# Patient Record
Sex: Female | Born: 1950
Health system: Southern US, Community
[De-identification: ages and names within clinical notes are randomized; demographics above are authoritative.]

## PROBLEM LIST (undated history)

## (undated) DIAGNOSIS — F329 Major depressive disorder, single episode, unspecified: Secondary | ICD-10-CM

## (undated) DIAGNOSIS — K219 Gastro-esophageal reflux disease without esophagitis: Secondary | ICD-10-CM

## (undated) DIAGNOSIS — K5909 Other constipation: Secondary | ICD-10-CM

## (undated) DIAGNOSIS — I10 Essential (primary) hypertension: Secondary | ICD-10-CM

## (undated) DIAGNOSIS — F419 Anxiety disorder, unspecified: Secondary | ICD-10-CM

## (undated) DIAGNOSIS — J019 Acute sinusitis, unspecified: Secondary | ICD-10-CM

## (undated) DIAGNOSIS — J309 Allergic rhinitis, unspecified: Secondary | ICD-10-CM

## (undated) DIAGNOSIS — K649 Unspecified hemorrhoids: Secondary | ICD-10-CM

## (undated) DIAGNOSIS — G44209 Tension-type headache, unspecified, not intractable: Secondary | ICD-10-CM

## (undated) DIAGNOSIS — J449 Chronic obstructive pulmonary disease, unspecified: Secondary | ICD-10-CM

## (undated) DIAGNOSIS — F17200 Nicotine dependence, unspecified, uncomplicated: Secondary | ICD-10-CM

## (undated) DIAGNOSIS — R9431 Abnormal electrocardiogram [ECG] [EKG]: Secondary | ICD-10-CM

## (undated) DIAGNOSIS — I517 Cardiomegaly: Secondary | ICD-10-CM

## (undated) DIAGNOSIS — N760 Acute vaginitis: Secondary | ICD-10-CM

## (undated) DIAGNOSIS — N390 Urinary tract infection, site not specified: Secondary | ICD-10-CM

## (undated) HISTORY — DX: Chronic obstructive pulmonary disease, unspecified: J44.9

## (undated) HISTORY — PX: TUBAL LIGATION: SHX77

## (undated) HISTORY — DX: Unspecified hemorrhoids: K64.9

## (undated) HISTORY — DX: Gastro-esophageal reflux disease without esophagitis: K21.9

## (undated) HISTORY — DX: Acute sinusitis, unspecified: J01.90

## (undated) HISTORY — DX: Abnormal electrocardiogram (ECG) (EKG): R94.31

## (undated) HISTORY — DX: Anxiety disorder, unspecified: F41.9

## (undated) HISTORY — DX: Urinary tract infection, site not specified: N39.0

## (undated) HISTORY — DX: Acute vaginitis: N76.0

## (undated) HISTORY — PX: OTHER SURGICAL HISTORY: SHX169

## (undated) HISTORY — DX: Cardiomegaly: I51.7

## (undated) HISTORY — DX: Other constipation: K59.09

## (undated) HISTORY — PX: UMBILICAL HERNIA REPAIR: SHX196

## (undated) HISTORY — PX: TONSILLECTOMY: SUR1361

## (undated) HISTORY — DX: Major depressive disorder, single episode, unspecified: F32.9

## (undated) HISTORY — DX: Tension-type headache, unspecified, not intractable: G44.209

## (undated) HISTORY — DX: Allergic rhinitis, unspecified: J30.9

## (undated) HISTORY — DX: Nicotine dependence, unspecified, uncomplicated: F17.200

## (undated) HISTORY — PX: BREAST BIOPSY: SHX20

## (undated) HISTORY — DX: Essential (primary) hypertension: I10

---

## 1997-11-18 ENCOUNTER — Encounter: Admission: RE | Admit: 1997-11-18 | Discharge: 1997-11-18 | Payer: Self-pay | Admitting: Family Medicine

## 1997-11-26 ENCOUNTER — Ambulatory Visit (HOSPITAL_COMMUNITY): Admission: RE | Admit: 1997-11-26 | Discharge: 1997-11-26 | Payer: Self-pay | Admitting: Family Medicine

## 1998-01-15 ENCOUNTER — Emergency Department (HOSPITAL_COMMUNITY): Admission: EM | Admit: 1998-01-15 | Discharge: 1998-01-15 | Payer: Self-pay | Admitting: Emergency Medicine

## 1998-01-29 ENCOUNTER — Encounter: Admission: RE | Admit: 1998-01-29 | Discharge: 1998-01-29 | Payer: Self-pay | Admitting: Family Medicine

## 1998-02-28 ENCOUNTER — Encounter: Admission: RE | Admit: 1998-02-28 | Discharge: 1998-02-28 | Payer: Self-pay | Admitting: Family Medicine

## 1998-03-14 ENCOUNTER — Other Ambulatory Visit: Admission: RE | Admit: 1998-03-14 | Discharge: 1998-03-14 | Payer: Self-pay | Admitting: *Deleted

## 1998-03-14 ENCOUNTER — Encounter: Admission: RE | Admit: 1998-03-14 | Discharge: 1998-03-14 | Payer: Self-pay | Admitting: Family Medicine

## 1998-03-21 ENCOUNTER — Encounter: Admission: RE | Admit: 1998-03-21 | Discharge: 1998-03-21 | Payer: Self-pay | Admitting: Family Medicine

## 1998-04-04 ENCOUNTER — Encounter: Admission: RE | Admit: 1998-04-04 | Discharge: 1998-04-04 | Payer: Self-pay | Admitting: Family Medicine

## 1999-01-27 ENCOUNTER — Encounter: Admission: RE | Admit: 1999-01-27 | Discharge: 1999-01-27 | Payer: Self-pay | Admitting: Sports Medicine

## 1999-03-12 ENCOUNTER — Ambulatory Visit (HOSPITAL_COMMUNITY): Admission: RE | Admit: 1999-03-12 | Discharge: 1999-03-12 | Payer: Self-pay | Admitting: Internal Medicine

## 2000-03-15 ENCOUNTER — Ambulatory Visit (HOSPITAL_COMMUNITY): Admission: RE | Admit: 2000-03-15 | Discharge: 2000-03-15 | Payer: Self-pay | Admitting: *Deleted

## 2001-03-17 ENCOUNTER — Ambulatory Visit (HOSPITAL_COMMUNITY): Admission: RE | Admit: 2001-03-17 | Discharge: 2001-03-17 | Payer: Self-pay | Admitting: Family Medicine

## 2001-03-21 ENCOUNTER — Encounter: Payer: Self-pay | Admitting: Family Medicine

## 2001-03-21 ENCOUNTER — Encounter: Admission: RE | Admit: 2001-03-21 | Discharge: 2001-03-21 | Payer: Self-pay | Admitting: Family Medicine

## 2002-02-28 ENCOUNTER — Encounter: Payer: Self-pay | Admitting: Emergency Medicine

## 2002-02-28 ENCOUNTER — Emergency Department (HOSPITAL_COMMUNITY): Admission: EM | Admit: 2002-02-28 | Discharge: 2002-02-28 | Payer: Self-pay | Admitting: Emergency Medicine

## 2002-03-26 ENCOUNTER — Encounter: Admission: RE | Admit: 2002-03-26 | Discharge: 2002-03-26 | Payer: Self-pay | Admitting: Family Medicine

## 2002-03-26 ENCOUNTER — Encounter: Payer: Self-pay | Admitting: Family Medicine

## 2003-04-24 ENCOUNTER — Ambulatory Visit (HOSPITAL_COMMUNITY): Admission: RE | Admit: 2003-04-24 | Discharge: 2003-04-24 | Payer: Self-pay | Admitting: Family Medicine

## 2003-05-08 ENCOUNTER — Encounter: Payer: Self-pay | Admitting: Emergency Medicine

## 2003-05-08 ENCOUNTER — Emergency Department (HOSPITAL_COMMUNITY): Admission: EM | Admit: 2003-05-08 | Discharge: 2003-05-08 | Payer: Self-pay | Admitting: Emergency Medicine

## 2004-01-11 ENCOUNTER — Emergency Department (HOSPITAL_COMMUNITY): Admission: EM | Admit: 2004-01-11 | Discharge: 2004-01-11 | Payer: Self-pay | Admitting: Emergency Medicine

## 2004-05-21 ENCOUNTER — Ambulatory Visit: Payer: Self-pay | Admitting: Nurse Practitioner

## 2004-05-22 ENCOUNTER — Ambulatory Visit (HOSPITAL_COMMUNITY): Admission: RE | Admit: 2004-05-22 | Discharge: 2004-05-22 | Payer: Self-pay | Admitting: Family Medicine

## 2004-05-26 ENCOUNTER — Ambulatory Visit: Payer: Self-pay | Admitting: *Deleted

## 2004-05-26 ENCOUNTER — Ambulatory Visit: Payer: Self-pay | Admitting: Nurse Practitioner

## 2004-05-27 ENCOUNTER — Ambulatory Visit (HOSPITAL_COMMUNITY): Admission: RE | Admit: 2004-05-27 | Discharge: 2004-05-27 | Payer: Self-pay | Admitting: Internal Medicine

## 2004-11-14 ENCOUNTER — Emergency Department (HOSPITAL_COMMUNITY): Admission: EM | Admit: 2004-11-14 | Discharge: 2004-11-14 | Payer: Self-pay | Admitting: Emergency Medicine

## 2004-11-16 ENCOUNTER — Ambulatory Visit: Payer: Self-pay | Admitting: Internal Medicine

## 2004-11-17 ENCOUNTER — Ambulatory Visit: Payer: Self-pay | Admitting: Internal Medicine

## 2004-12-10 ENCOUNTER — Ambulatory Visit: Payer: Self-pay | Admitting: Nurse Practitioner

## 2004-12-17 ENCOUNTER — Ambulatory Visit: Payer: Self-pay | Admitting: Nurse Practitioner

## 2005-01-05 ENCOUNTER — Ambulatory Visit: Payer: Self-pay | Admitting: Nurse Practitioner

## 2005-01-08 ENCOUNTER — Ambulatory Visit (HOSPITAL_COMMUNITY): Admission: RE | Admit: 2005-01-08 | Discharge: 2005-01-08 | Payer: Self-pay | Admitting: Nurse Practitioner

## 2005-03-22 ENCOUNTER — Ambulatory Visit: Payer: Self-pay | Admitting: Nurse Practitioner

## 2005-07-07 ENCOUNTER — Emergency Department (HOSPITAL_COMMUNITY): Admission: EM | Admit: 2005-07-07 | Discharge: 2005-07-07 | Payer: Self-pay | Admitting: Emergency Medicine

## 2005-07-27 ENCOUNTER — Ambulatory Visit (HOSPITAL_COMMUNITY): Admission: RE | Admit: 2005-07-27 | Discharge: 2005-07-27 | Payer: Self-pay | Admitting: Family Medicine

## 2005-08-20 ENCOUNTER — Ambulatory Visit: Payer: Self-pay | Admitting: Pulmonary Disease

## 2006-01-24 ENCOUNTER — Ambulatory Visit: Payer: Self-pay | Admitting: Internal Medicine

## 2006-01-31 ENCOUNTER — Ambulatory Visit: Payer: Self-pay | Admitting: Pulmonary Disease

## 2006-02-04 ENCOUNTER — Ambulatory Visit: Payer: Self-pay | Admitting: Internal Medicine

## 2006-02-14 ENCOUNTER — Ambulatory Visit: Payer: Self-pay | Admitting: Pulmonary Disease

## 2006-03-03 ENCOUNTER — Ambulatory Visit: Payer: Self-pay | Admitting: Internal Medicine

## 2006-03-30 ENCOUNTER — Ambulatory Visit: Payer: Self-pay | Admitting: Gastroenterology

## 2006-04-07 ENCOUNTER — Ambulatory Visit: Payer: Self-pay | Admitting: Gastroenterology

## 2006-07-18 ENCOUNTER — Ambulatory Visit: Payer: Self-pay | Admitting: Internal Medicine

## 2006-07-27 ENCOUNTER — Ambulatory Visit (HOSPITAL_COMMUNITY): Admission: RE | Admit: 2006-07-27 | Discharge: 2006-07-27 | Payer: Self-pay | Admitting: Internal Medicine

## 2006-08-16 ENCOUNTER — Encounter: Admission: RE | Admit: 2006-08-16 | Discharge: 2006-08-16 | Payer: Self-pay | Admitting: Internal Medicine

## 2006-08-26 ENCOUNTER — Encounter: Admission: RE | Admit: 2006-08-26 | Discharge: 2006-08-26 | Payer: Self-pay | Admitting: Internal Medicine

## 2006-08-29 ENCOUNTER — Ambulatory Visit: Payer: Self-pay | Admitting: Internal Medicine

## 2006-08-29 LAB — CONVERTED CEMR LAB: CA 125: 17.4 units/mL (ref 0.0–30.2)

## 2006-08-31 ENCOUNTER — Encounter: Payer: Self-pay | Admitting: Internal Medicine

## 2006-09-01 ENCOUNTER — Encounter: Payer: Self-pay | Admitting: Internal Medicine

## 2006-10-26 ENCOUNTER — Ambulatory Visit: Payer: Self-pay | Admitting: Internal Medicine

## 2006-11-24 ENCOUNTER — Ambulatory Visit: Payer: Self-pay | Admitting: Internal Medicine

## 2007-01-23 LAB — CONVERTED CEMR LAB: Pap Smear: NORMAL

## 2007-02-16 ENCOUNTER — Emergency Department (HOSPITAL_COMMUNITY): Admission: EM | Admit: 2007-02-16 | Discharge: 2007-02-16 | Payer: Self-pay | Admitting: Emergency Medicine

## 2007-04-04 ENCOUNTER — Encounter: Payer: Self-pay | Admitting: *Deleted

## 2007-04-04 DIAGNOSIS — J449 Chronic obstructive pulmonary disease, unspecified: Secondary | ICD-10-CM

## 2007-04-04 DIAGNOSIS — J4489 Other specified chronic obstructive pulmonary disease: Secondary | ICD-10-CM

## 2007-04-04 DIAGNOSIS — K219 Gastro-esophageal reflux disease without esophagitis: Secondary | ICD-10-CM

## 2007-04-04 DIAGNOSIS — F17200 Nicotine dependence, unspecified, uncomplicated: Secondary | ICD-10-CM

## 2007-04-04 HISTORY — DX: Chronic obstructive pulmonary disease, unspecified: J44.9

## 2007-04-04 HISTORY — DX: Gastro-esophageal reflux disease without esophagitis: K21.9

## 2007-04-04 HISTORY — DX: Other specified chronic obstructive pulmonary disease: J44.89

## 2007-04-04 HISTORY — DX: Nicotine dependence, unspecified, uncomplicated: F17.200

## 2007-06-06 ENCOUNTER — Ambulatory Visit: Payer: Self-pay | Admitting: Gastroenterology

## 2007-06-12 ENCOUNTER — Ambulatory Visit: Payer: Self-pay | Admitting: Internal Medicine

## 2007-06-12 DIAGNOSIS — I10 Essential (primary) hypertension: Secondary | ICD-10-CM

## 2007-06-12 HISTORY — DX: Essential (primary) hypertension: I10

## 2007-06-23 ENCOUNTER — Ambulatory Visit: Payer: Self-pay | Admitting: Gastroenterology

## 2007-07-12 ENCOUNTER — Ambulatory Visit: Payer: Self-pay | Admitting: Internal Medicine

## 2007-08-04 ENCOUNTER — Ambulatory Visit (HOSPITAL_COMMUNITY): Admission: RE | Admit: 2007-08-04 | Discharge: 2007-08-04 | Payer: Self-pay | Admitting: Internal Medicine

## 2007-09-01 ENCOUNTER — Ambulatory Visit: Payer: Self-pay | Admitting: Internal Medicine

## 2007-09-01 DIAGNOSIS — J309 Allergic rhinitis, unspecified: Secondary | ICD-10-CM

## 2007-09-01 HISTORY — DX: Allergic rhinitis, unspecified: J30.9

## 2007-09-05 ENCOUNTER — Telehealth: Payer: Self-pay | Admitting: Internal Medicine

## 2007-09-25 DIAGNOSIS — K649 Unspecified hemorrhoids: Secondary | ICD-10-CM | POA: Insufficient documentation

## 2007-09-25 HISTORY — DX: Unspecified hemorrhoids: K64.9

## 2007-09-29 ENCOUNTER — Ambulatory Visit: Payer: Self-pay | Admitting: Internal Medicine

## 2007-12-13 ENCOUNTER — Ambulatory Visit: Payer: Self-pay | Admitting: Internal Medicine

## 2007-12-13 ENCOUNTER — Telehealth: Payer: Self-pay | Admitting: Internal Medicine

## 2008-04-18 ENCOUNTER — Ambulatory Visit: Payer: Self-pay | Admitting: Internal Medicine

## 2008-04-18 DIAGNOSIS — G44209 Tension-type headache, unspecified, not intractable: Secondary | ICD-10-CM

## 2008-04-18 HISTORY — DX: Tension-type headache, unspecified, not intractable: G44.209

## 2008-04-22 ENCOUNTER — Telehealth: Payer: Self-pay | Admitting: Internal Medicine

## 2008-04-24 ENCOUNTER — Encounter: Payer: Self-pay | Admitting: Internal Medicine

## 2008-05-02 ENCOUNTER — Ambulatory Visit: Payer: Self-pay | Admitting: Internal Medicine

## 2008-05-03 ENCOUNTER — Encounter: Payer: Self-pay | Admitting: Internal Medicine

## 2008-05-03 ENCOUNTER — Telehealth: Payer: Self-pay | Admitting: Internal Medicine

## 2008-05-09 ENCOUNTER — Ambulatory Visit: Payer: Self-pay | Admitting: Psychiatry

## 2008-05-09 ENCOUNTER — Encounter: Payer: Self-pay | Admitting: Internal Medicine

## 2008-05-15 ENCOUNTER — Ambulatory Visit: Payer: Self-pay | Admitting: Psychiatry

## 2008-06-18 ENCOUNTER — Ambulatory Visit: Payer: Self-pay | Admitting: Internal Medicine

## 2008-07-03 ENCOUNTER — Telehealth: Payer: Self-pay | Admitting: Internal Medicine

## 2008-08-07 ENCOUNTER — Ambulatory Visit (HOSPITAL_COMMUNITY): Admission: RE | Admit: 2008-08-07 | Discharge: 2008-08-07 | Payer: Self-pay | Admitting: Internal Medicine

## 2008-10-14 ENCOUNTER — Ambulatory Visit: Payer: Self-pay | Admitting: Internal Medicine

## 2008-10-15 ENCOUNTER — Telehealth: Payer: Self-pay | Admitting: Internal Medicine

## 2008-10-31 ENCOUNTER — Ambulatory Visit: Payer: Self-pay | Admitting: Internal Medicine

## 2008-11-04 ENCOUNTER — Telehealth: Payer: Self-pay | Admitting: Internal Medicine

## 2008-11-05 ENCOUNTER — Telehealth: Payer: Self-pay | Admitting: Internal Medicine

## 2008-11-11 ENCOUNTER — Emergency Department (HOSPITAL_COMMUNITY): Admission: EM | Admit: 2008-11-11 | Discharge: 2008-11-11 | Payer: Self-pay | Admitting: Emergency Medicine

## 2008-12-18 ENCOUNTER — Ambulatory Visit: Payer: Self-pay | Admitting: Internal Medicine

## 2008-12-18 DIAGNOSIS — K5909 Other constipation: Secondary | ICD-10-CM

## 2008-12-18 HISTORY — DX: Other constipation: K59.09

## 2009-01-17 ENCOUNTER — Other Ambulatory Visit: Admission: RE | Admit: 2009-01-17 | Discharge: 2009-01-17 | Payer: Self-pay | Admitting: Internal Medicine

## 2009-01-17 ENCOUNTER — Encounter: Payer: Self-pay | Admitting: Internal Medicine

## 2009-01-17 ENCOUNTER — Ambulatory Visit: Payer: Self-pay | Admitting: Internal Medicine

## 2009-01-17 DIAGNOSIS — R9431 Abnormal electrocardiogram [ECG] [EKG]: Secondary | ICD-10-CM

## 2009-01-17 DIAGNOSIS — I517 Cardiomegaly: Secondary | ICD-10-CM

## 2009-01-17 HISTORY — DX: Abnormal electrocardiogram (ECG) (EKG): R94.31

## 2009-01-17 HISTORY — DX: Cardiomegaly: I51.7

## 2009-01-17 LAB — CONVERTED CEMR LAB
Albumin: 4.3 g/dL (ref 3.5–5.2)
BUN: 14 mg/dL (ref 6–23)
Basophils Absolute: 0 10*3/uL (ref 0.0–0.1)
Bilirubin Urine: NEGATIVE
CO2: 29 meq/L (ref 19–32)
Chloride: 107 meq/L (ref 96–112)
Cholesterol: 145 mg/dL (ref 0–200)
Glucose, Bld: 88 mg/dL (ref 70–99)
HCT: 36.1 % (ref 36.0–46.0)
Leukocytes, UA: NEGATIVE
Lymphs Abs: 2 10*3/uL (ref 0.7–4.0)
MCHC: 32.9 g/dL (ref 30.0–36.0)
MCV: 90.2 fL (ref 78.0–100.0)
Monocytes Absolute: 0.6 10*3/uL (ref 0.1–1.0)
Neutro Abs: 3 10*3/uL (ref 1.4–7.7)
Nitrite: NEGATIVE
Platelets: 221 10*3/uL (ref 150.0–400.0)
Potassium: 4 meq/L (ref 3.5–5.1)
RDW: 12.4 % (ref 11.5–14.6)
Specific Gravity, Urine: 1.02 (ref 1.000–1.030)
TSH: 0.63 microintl units/mL (ref 0.35–5.50)
Total Bilirubin: 0.9 mg/dL (ref 0.3–1.2)
Urobilinogen, UA: 0.2 (ref 0.0–1.0)
VLDL: 11.6 mg/dL (ref 0.0–40.0)
pH: 6 (ref 5.0–8.0)

## 2009-01-22 ENCOUNTER — Encounter: Payer: Self-pay | Admitting: Internal Medicine

## 2009-01-23 ENCOUNTER — Telehealth (INDEPENDENT_AMBULATORY_CARE_PROVIDER_SITE_OTHER): Payer: Self-pay | Admitting: *Deleted

## 2009-01-27 ENCOUNTER — Ambulatory Visit: Payer: Self-pay

## 2009-01-27 ENCOUNTER — Encounter: Payer: Self-pay | Admitting: Cardiovascular Disease

## 2009-08-18 ENCOUNTER — Ambulatory Visit: Payer: Self-pay | Admitting: Internal Medicine

## 2009-08-26 ENCOUNTER — Ambulatory Visit (HOSPITAL_COMMUNITY): Admission: RE | Admit: 2009-08-26 | Discharge: 2009-08-26 | Payer: Self-pay | Admitting: Internal Medicine

## 2009-09-01 ENCOUNTER — Telehealth: Payer: Self-pay | Admitting: Internal Medicine

## 2009-09-18 ENCOUNTER — Ambulatory Visit: Payer: Self-pay | Admitting: Internal Medicine

## 2009-09-21 ENCOUNTER — Emergency Department (HOSPITAL_COMMUNITY): Admission: EM | Admit: 2009-09-21 | Discharge: 2009-09-21 | Payer: Self-pay | Admitting: Emergency Medicine

## 2009-09-23 ENCOUNTER — Inpatient Hospital Stay (HOSPITAL_COMMUNITY): Admission: RE | Admit: 2009-09-23 | Discharge: 2009-09-25 | Payer: Self-pay | Admitting: General Surgery

## 2009-09-23 ENCOUNTER — Encounter (INDEPENDENT_AMBULATORY_CARE_PROVIDER_SITE_OTHER): Payer: Self-pay | Admitting: General Surgery

## 2009-10-03 ENCOUNTER — Encounter: Payer: Self-pay | Admitting: Internal Medicine

## 2009-10-27 ENCOUNTER — Ambulatory Visit: Payer: Self-pay | Admitting: Internal Medicine

## 2009-10-27 ENCOUNTER — Encounter: Payer: Self-pay | Admitting: Internal Medicine

## 2009-10-27 ENCOUNTER — Telehealth: Payer: Self-pay | Admitting: Internal Medicine

## 2009-11-13 ENCOUNTER — Ambulatory Visit: Payer: Self-pay | Admitting: Internal Medicine

## 2009-11-17 ENCOUNTER — Ambulatory Visit: Payer: Self-pay | Admitting: Internal Medicine

## 2009-12-03 ENCOUNTER — Ambulatory Visit: Payer: Self-pay | Admitting: Internal Medicine

## 2010-02-23 ENCOUNTER — Ambulatory Visit: Payer: Self-pay | Admitting: Licensed Clinical Social Worker

## 2010-05-08 ENCOUNTER — Ambulatory Visit: Payer: Self-pay | Admitting: Internal Medicine

## 2010-05-08 DIAGNOSIS — J019 Acute sinusitis, unspecified: Secondary | ICD-10-CM

## 2010-05-08 HISTORY — DX: Acute sinusitis, unspecified: J01.90

## 2010-05-22 ENCOUNTER — Ambulatory Visit: Payer: Self-pay | Admitting: Internal Medicine

## 2010-08-23 ENCOUNTER — Encounter: Payer: Self-pay | Admitting: Internal Medicine

## 2010-08-31 ENCOUNTER — Ambulatory Visit (HOSPITAL_COMMUNITY): Admission: RE | Admit: 2010-08-31 | Payer: Self-pay | Source: Home / Self Care | Admitting: Internal Medicine

## 2010-09-01 NOTE — Progress Notes (Signed)
  Phone Note Outgoing Call   Summary of Call: Called patient to advise of xray results. Patient notified Initial call taken by: Rock Nephew CMA,  October 27, 2009 3:25 PM

## 2010-09-01 NOTE — Assessment & Plan Note (Signed)
Summary: HEADACHE/ SINUS? /NWS   Vital Signs:  Patient profile:   60 year old female Height:      58 inches Weight:      86 pounds BMI:     18.04 O2 Sat:      92 % on Room air Temp:     97.5 degrees F oral Pulse rate:   69 / minute Pulse rhythm:   regular Resp:     16 per minute BP sitting:   100 / 62  (left arm) Cuff size:   small  Vitals Entered By: Rock Nephew CMA (August 18, 2009 9:47 AM)  O2 Flow:  Room air CC: CONGESTION, COUGH, URI symptoms Is Patient Diabetic? No Pain Assessment Patient in pain? yes     Location: head   Primary Care Provider:  Etta Grandchild MD  CC:  CONGESTION, COUGH, and URI symptoms.  History of Present Illness:  URI Symptoms      This is a 61 year old woman who presents with URI symptoms.  The symptoms began 3 days ago.  The severity is described as mild.  The patient reports nasal congestion and purulent nasal discharge, but denies sore throat, dry cough, productive cough, earache, and sick contacts.  The patient denies fever, stiff neck, dyspnea, wheezing, rash, vomiting, diarrhea, and use of an antipyretic.  The patient also reports sneezing and seasonal symptoms.  The patient denies headache, muscle aches, and severe fatigue.  Risk factors for Strep sinusitis include unilateral facial pain, unilateral nasal discharge, and absence of cough.  The patient denies the following risk factors for Strep sinusitis: Strep exposure and tender adenopathy.    Preventive Screening-Counseling & Management  Alcohol-Tobacco     Smoking Cessation Counseling: yes  Allergies: No Known Drug Allergies  Past History:  Past Medical History: Reviewed history from 12/13/2007 and no changes required. GERD Hypertension Allergic rhinitis Asthma COPD uterine fibroid Anxiety Tobacco Abuse  Past Surgical History: Reviewed history from 09/01/2007 and no changes required. Tonsillectomy s/p breast bx Tubal ligation s/p umbilical hernia  Family  History: Reviewed history from 09/01/2007 and no changes required. stroke HTN siezure disorder mother with asthma uncle with COPD  Social History: Reviewed history from 09/01/2007 and no changes required. 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    Physical Exam  General:  alert, cooperative to examination, good hygiene, and underweight appearing.   Head:  Normocephalic and atraumatic without obvious abnormalities. No apparent alopecia or balding. Ears:  R ear normal, L ear normal, and no external deformities.  Nose:  no external deformity, no airflow obstruction, no intranasal foreign body, no nasal polyps, no nasal mucosal lesions, no mucosal friability, no active bleeding or clots, nasal dischargemucosal pallor, mucosal edema, L maxillary sinus tenderness, and R maxillary sinus tenderness.   Mouth:  Oral mucosa and oropharynx without lesions or exudates.  Teeth in good repair. Neck:  supple, full ROM, no masses, and no thyromegaly. no carotid bruits, no cervical lymphadenopathy, and no neck tenderness.   Lungs:  normal respiratory effort, no intercostal retractions, no accessory muscle use, normal breath sounds, no dullness, no crackles, and no wheezes.   Heart:  Normal rate and regular rhythm. S1 and S2 normal without gallop, murmur, click, rub  or other extra sounds. Abdomen:  soft, non-tender, normal bowel sounds, no distention, no masses, no hepatomegaly, and no splenomegaly.    and  umbilical hernia.  abdominal scar(s).   Msk:  No deformity or scoliosis noted of thoracic or lumbar spine.   Pulses:  R and L carotid,radial,femoral,dorsalis pedis and  posterior tibial pulses are full and equal bilaterally Extremities:  No clubbing, cyanosis, edema, or deformity noted with normal full range of motion of all joints.   Neurologic:  No cranial nerve deficits noted. Station and gait are normal. Plantar reflexes are down-going bilaterally. DTRs are symmetrical throughout. Sensory, motor and coordinative functions appear intact. Skin:  Intact without suspicious lesions or rashes Cervical Nodes:  No lymphadenopathy noted Axillary Nodes:  No palpable lymphadenopathy Psych:  Cognition and judgment appear intact. Alert and cooperative with normal attention span and concentration. No apparent delusions, illusions, hallucinations   Impression & Recommendations:  Problem # 1:  SINUSITIS- ACUTE-NOS (ICD-461.9) Assessment New  Instructed on treatment. Call if symptoms persist or worsen.   Her updated medication list for this problem includes:    Amoxicillin 500 Mg Cap (Amoxicillin) .Marland Kitchen... Take 1 capsule by mouth three times a day x 10 days  Orders: Admin of Therapeutic Inj  intramuscular or subcutaneous ([address]) Depo- Medrol 80mg  (J1040)  Problem # 2:  ALLERGIC RHINITIS (ICD-477.9) Assessment: Deteriorated  try depo-medrol  Discussed use of allergy medications and environmental measures.   Problem # 3:  HYPERTENSION (ICD-401.9) Assessment: Improved  Her updated medication list for this problem includes:    Amlodipine Besylate 10 Mg Tabs (Amlodipine besylate) .Marland Kitchen... Take 1 tablet by mouth once a day  BP today: 100/62 Prior BP: 110/70 (01/17/2009)  Prior 10 Yr Risk Heart Disease: Not enough information (04/18/2008)  Labs Reviewed: K+: 4.0 (01/17/2009) Creat: : 0.6 (01/17/2009)   Chol: 145 (01/17/2009)   HDL: 64.60 (01/17/2009)   LDL: 69 (01/17/2009)   TG: 58.0 (01/17/2009)  Complete Medication List: 1)  Amlodipine Besylate 10 Mg Tabs (Amlodipine besylate) .... Take 1 tablet by mouth once a day 2)  Proair Hfa 108 (90 Base) Mcg/act Aers  (Albuterol sulfate) .... 2 puffs q 6 hrs prn 3)  Protonix 40 Mg Tbec (Pantoprazole sodium) .... Once daily 4)  Miralax Pack (Polyethylene glycol 3350) .... Once daily in 8 ounces of juice 5)  Amoxicillin 500 Mg Cap (Amoxicillin) .... Take 1 capsule by mouth three times a day x 10 days  Patient Instructions: 1)  Please schedule a follow-up appointment in 1 month. 2)  Take your antibiotic as prescribed until ALL of it is gone, but stop if you develop a rash or swelling and contact our office as soon as possible. 3)  Acute sinusitis symptoms for less than 10 days are not helped by antibiotics.Use warm moist compresses, and over the counter decongestants ( only as directed). Call if no improvement in 5-7 days, sooner if increasing pain, fever, or new symptoms. 4)  Tobacco is very bad for your health and your loved ones! You Should stop smoking!. 5)  Stop Smoking Tips: Choose a Quit date. Cut down before the Quit date. decide what you will do as a substitute when you feel the urge to smoke(gum,toothpick,exercise). Prescriptions: AMOXICILLIN 500 MG CAP (AMOXICILLIN) Take 1 capsule by mouth three times a day X 10 days  #30 x 0   Entered and Authorized by:   Etta Grandchild MD   Signed by:   Etta Grandchild MD on 08/18/2009   Method  used:   [facility]        [facility]  [address] [phone]* (retail)       [address]       [place], [place]  [address]       Ph: [phone] or [phone]       Fax: [phone]   RxID:   [id number]    Medication Administration  Injection # 1:    Medication: Depo- Medrol 80mg     Diagnosis: SINUSITIS- ACUTE-NOS (ICD-461.9)    Route: IM    Site: L deltoid    Exp Date: 05/2010    Lot #: [id number]    Mfr: teva    Patient tolerated injection without complications    Given by: Rock Nephew CMA (August 18, 2009 10:08 AM)  Orders Added: 1)  Admin of Therapeutic Inj  intramuscular or subcutaneous [96372] 2)  Depo- Medrol 80mg   [J1040] 3)  Est. Patient Level IV [74259]
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
## 2010-09-01 NOTE — Letter (Signed)
Summary: [facility]  [address] [place], [place] [address]   Phone: [phone]  Fax: [phone]    Today's Date: November 13, 2009  Name of Patient: Amber Griffith [facility]  The above named patient had a medical visit today at: 1 pm.  Please take this into consideration when reviewing the time away from work/school.    Special Instructions:  [  ] None  [  ] To be off the remainder of today, returning to the normal work / school schedule tomorrow.  [  ] To be off until the next scheduled appointment on ______________________.  [ X] Other:  she can return to work but she cannot lift > 10 pounds, this will be in  effect indefinitely until she has fully recovered from surgery.________________________________________________________________ ________________________________________________________________________   Sincerely yours,   Sanda Linger MD

## 2010-09-01 NOTE — Progress Notes (Signed)
Summary: refill  Phone Note Refill Request Message from:  Fax from Pharmacy on September 01, 2009 9:00 AM  Refills Requested: Medication #1:  AMLODIPINE BESYLATE 10 MG  TABS Take 1 tablet by mouth once a day Initial call taken by: Rock Nephew CMA,  September 01, 2009 9:00 AM    Prescriptions: AMLODIPINE BESYLATE 10 MG  TABS (AMLODIPINE BESYLATE) Take 1 tablet by mouth once a day  #30 Tablet x 6   Entered by:   Rock Nephew CMA   Authorized by:   Etta Grandchild MD   Signed by:   Rock Nephew CMA on 09/01/2009   Method used:   [facility]        [facility]  [address] [phone][facility] (retail)       [address]       [place], [place]  [address]       Ph: [phone] or [phone]       Fax: [phone]   RxID:   [id number]
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
## 2010-09-01 NOTE — Letter (Signed)
Summary: Out of Work  [facility]  [address] [place], [place] [address]   Phone: [phone]  Fax: [phone]    Dec 03, 2009   Employee:  [employer]    To Whom It May Concern:   The above named patient is able to return to work with no restrictions effective    Start:   12/08/2009    If you need additional information, please feel free to contact our office.         Sincerely,    Alvy Beal A CMA for Dr. Sanda Linger

## 2010-09-01 NOTE — Letter (Signed)
Summary: [facility]   Imported By: Lanelle Bal 10/27/2009 11:22:23  _____________________________________________________________________  External Attachment:    Type:   Image     Comment:   External Document

## 2010-09-01 NOTE — Assessment & Plan Note (Signed)
Summary: [facility]   Vital Signs:  Patient profile:   60 year old female Height:      58 inches Weight:      87 pounds O2 Sat:      99 % on Room air Temp:     98.1 degrees F oral Pulse rate:   66 / minute Pulse rhythm:   regular Resp:     16 per minute BP sitting:   108 / 64  (left arm) Cuff size:   regular  Vitals Entered By: Rock Nephew CMA (October 27, 2009 1:27 PM)  O2 Flow:  Room air CC: follow-up visit after hernia sugery   Primary Care Provider:  Etta Grandchild MD  CC:  follow-up visit after hernia sugery.  History of Present Illness: She returns for f/up after having had an umbilical hernia repair about 6 weeks ago and says that she is doing well.  She has pain in her right wrist over the lateral aspect for some time now. There has been no specific injury or trauma. She has not yet returned to work from the hernia repair.  Preventive Screening-Counseling & Management  Alcohol-Tobacco     Alcohol drinks/day: 0     Alcohol type: beer     >5/day in last 3 mos: no     Alcohol Counseling: not indicated; use of alcohol is not excessive or problematic     Feels need to cut down: no     Feels annoyed by complaints: no     Feels guilty re: drinking: no     Needs 'eye opener' in am: no     Smoking Status: current     Smoking Cessation Counseling: yes     Smoke Cessation Stage: precontemplative     Packs/Day: 1.0  Hep-HIV-STD-Contraception     Hepatitis Risk: no risk noted     HIV Risk: no risk noted     STD Risk: no risk noted     SBE monthly: yes     SBE Education/Counseling: to perform regular SBE      Sexual History:  currently monogamous.        Drug Use:  never.        Blood Transfusions:  no.    Medications Prior to Update: 1)  Amlodipine Besylate 10 Mg  Tabs (Amlodipine Besylate) .... Take 1 Tablet By Mouth Once A Day 2)  Proair Hfa 108 (90 Base) Mcg/act  Aers (Albuterol Sulfate) .... 2 Puffs Q 6 Hrs Prn 3)  Protonix 40 Mg Tbec (Pantoprazole  Sodium) .... Once Daily 4)  Miralax  Pack (Polyethylene Glycol 3350) .... Once Daily in 8 Ounces of Juice  Current Medications (verified): 1)  Amlodipine Besylate 10 Mg  Tabs (Amlodipine Besylate) .... Take 1 Tablet By Mouth Once A Day 2)  Proair Hfa 108 (90 Base) Mcg/act  Aers (Albuterol Sulfate) .... 2 Puffs Q 6 Hrs Prn 3)  Protonix 40 Mg Tbec (Pantoprazole Sodium) .... Once Daily 4)  Miralax  Pack (Polyethylene Glycol 3350) .... Once Daily in 8 Ounces of Juice  Allergies (verified): No Known Drug Allergies  Past History:  Past Medical History: Reviewed history from 12/13/2007 and no changes required. GERD Hypertension Allergic rhinitis Asthma COPD uterine fibroid Anxiety Tobacco Abuse  Past Surgical History: Reviewed history from 09/01/2007 and no changes required. Tonsillectomy s/p breast bx Tubal ligation s/p umbilical hernia  Family History: Reviewed history from 09/01/2007 and no changes required. stroke HTN siezure disorder mother with asthma uncle with  COPD  Social History: Reviewed history from 09/01/2007 and no changes required. 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   GI:  Denies abdominal pain, bloody stools, change in bowel habits, diarrhea, indigestion, loss of appetite, nausea, and vomiting.  Physical Exam  General:  alert, cooperative to examination, good hygiene, and underweight appearing.   Mouth:  Oral mucosa and oropharynx without lesions or exudates.  Teeth in good repair. Neck:  supple, full ROM, no masses, and no thyromegaly. no carotid bruits, no cervical lymphadenopathy, and no neck tenderness.   Lungs:  normal respiratory effort, no intercostal retractions, no accessory muscle use, normal breath sounds, no dullness, no crackles, and no wheezes.   Heart:  Normal rate and regular  rhythm. S1 and S2 normal without gallop, murmur, click, rub or other extra sounds. Abdomen:  soft, non-tender, normal bowel sounds, no distention, no masses, no guarding, no rigidity, no rebound tenderness, no abdominal hernia, no inguinal hernia, no hepatomegaly, no splenomegaly, and abdominal scar(s).   Msk:  there is mild swelling over the lateral aspect of the right wrist extending from the radial styloid but there is no crepitance or erythema. Pulses:  R and L carotid,radial,femoral,dorsalis pedis and posterior tibial pulses are full and equal bilaterally Extremities:  No clubbing, cyanosis, edema, or deformity noted with normal full range of motion of all joints.   Neurologic:  No cranial nerve deficits noted. Station and gait are normal. Plantar reflexes are down-going bilaterally. DTRs are symmetrical throughout. Sensory, motor and coordinative functions appear intact. Skin:  Intact without suspicious lesions or rashes Psych:  Cognition and judgment appear intact. Alert and cooperative with normal attention span and concentration. No apparent delusions, illusions, hallucinations   Impression & Recommendations:  Problem # 1:  WRIST PAIN, RIGHT ([id number]-119.14) Assessment New  Orders: T-Wrist Comp Right (73110TC) Splints- All Types (N8295)  Complete Medication List: 1)  Amlodipine Besylate 10 Mg Tabs (Amlodipine besylate) .... Take 1 tablet by mouth once a day 2)  Proair Hfa 108 (90 Base) Mcg/act Aers (Albuterol sulfate) .... 2 puffs q 6 hrs prn 3)  Protonix 40 Mg Tbec (Pantoprazole sodium) .... Once daily 4)  Miralax Pack (Polyethylene glycol 3350) .... Once daily in 8 ounces of juice  Patient Instructions: 1)  Tobacco is very bad for your health and your loved ones! You Should stop smoking!. 2)  Stop Smoking Tips: Choose a Quit date. Cut down before the Quit date. decide what you will do as a substitute when you feel the urge to smoke(gum,toothpick,exercise). 3)  Take 650-1000mg  of  Tylenol every 4-6 hours as needed for relief of pain or comfort of fever AVOID taking more than 4000mg   in a 24 hour period (can cause liver damage in higher doses). 4)  Take 400-600mg  of Ibuprofen (Advil, Motrin) with food every 4-6 hours as needed for relief of pain or comfort of fever.

## 2010-09-01 NOTE — Assessment & Plan Note (Signed)
Summary: check hernia per pt/cd   Vital Signs:  Patient profile:   60 year old female Height:      58 inches Weight:      86.25 pounds BMI:     18.09 O2 Sat:      97 % on Room air Temp:     97.1 degrees F oral Pulse rate:   79 / minute Pulse rhythm:   regular Resp:     16 per minute BP sitting:   102 / 64  (left arm) Cuff size:   regular  Vitals Entered By: Rock Nephew CMA (November 13, 2009 1:09 PM)  O2 Flow:  Room air CC: hernia recheck and discuss work restriction, Hypertension Management Is Patient Diabetic? No   Primary Care Provider:  Etta Grandchild MD  CC:  hernia recheck and discuss work restriction and Hypertension Management.  History of Present Illness: She returns for f/up and needs work restrictions b/c it hurts her scar when she lifts heavy objects.  Hypertension History:      She denies headache, chest pain, palpitations, dyspnea with exertion, orthopnea, PND, peripheral edema, visual symptoms, neurologic problems, syncope, and side effects from treatment.  She notes no problems with any antihypertensive medication side effects.        Positive major cardiovascular risk factors include female age 43 years old or older, hypertension, and current tobacco user.  Negative major cardiovascular risk factors include no history of diabetes or hyperlipidemia and negative family history for ischemic heart disease.        Further assessment for target organ damage reveals no history of ASHD, cardiac end-organ damage (CHF/LVH), stroke/TIA, peripheral vascular disease, renal insufficiency, or hypertensive retinopathy.     Preventive Screening-Counseling & Management  Alcohol-Tobacco     Alcohol drinks/day: <1     Alcohol type: beer     >5/day in last 3 mos: no     Alcohol Counseling: not indicated; use of alcohol is not excessive or problematic     Feels need to cut down: no     Feels annoyed by complaints: no     Feels guilty re: drinking: no     Needs 'eye opener'  in am: no     Smoking Status: current     Smoking Cessation Counseling: yes     Smoke Cessation Stage: precontemplative     Packs/Day: 1.0  Hep-HIV-STD-Contraception     Hepatitis Risk: no risk noted     HIV Risk: no risk noted     STD Risk: no risk noted     SBE monthly: yes     SBE Education/Counseling: to perform regular SBE  Medications Prior to Update: 1)  Amlodipine Besylate 10 Mg  Tabs (Amlodipine Besylate) .... Take 1 Tablet By Mouth Once A Day 2)  Proair Hfa 108 (90 Base) Mcg/act  Aers (Albuterol Sulfate) .... 2 Puffs Q 6 Hrs Prn 3)  Protonix 40 Mg Tbec (Pantoprazole Sodium) .... Once Daily 4)  Miralax  Pack (Polyethylene Glycol 3350) .... Once Daily in 8 Ounces of Juice  Current Medications (verified): 1)  Amlodipine Besylate 10 Mg  Tabs (Amlodipine Besylate) .... Take 1 Tablet By Mouth Once A Day 2)  Proair Hfa 108 (90 Base) Mcg/act  Aers (Albuterol Sulfate) .... 2 Puffs Q 6 Hrs Prn 3)  Protonix 40 Mg Tbec (Pantoprazole Sodium) .... Once Daily 4)  Miralax  Pack (Polyethylene Glycol 3350) .... Once Daily in 8 Ounces of Juice  Allergies (verified): No Known Drug  Allergies  Past History:  Past Medical History: Reviewed history from 12/13/2007 and no changes required. GERD Hypertension Allergic rhinitis Asthma COPD uterine fibroid Anxiety Tobacco Abuse  Past Surgical History: Reviewed history from 09/01/2007 and no changes required. Tonsillectomy s/p breast bx Tubal ligation s/p umbilical hernia  Family History: Reviewed history from 09/01/2007 and no changes required. stroke HTN siezure disorder mother with asthma uncle with COPD  Social History: Reviewed history from 09/01/2007 and no changes required. 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   GI:  Denies abdominal pain, change in bowel habits, dark tarry stools, loss  of appetite, nausea, vomiting, and yellowish skin color.  Physical Exam  General:  alert, cooperative to examination, good hygiene, and underweight appearing.   Mouth:  Oral mucosa and oropharynx without lesions or exudates.  Teeth in good repair. Neck:  supple, full ROM, no masses, and no thyromegaly. no carotid bruits, no cervical lymphadenopathy, and no neck tenderness.   Lungs:  normal respiratory effort, no intercostal retractions, no accessory muscle use, normal breath sounds, no dullness, no crackles, and no wheezes.   Heart:  Normal rate and regular rhythm. S1 and S2 normal without gallop, murmur, click, rub or other extra sounds. Abdomen:  soft, non-tender, normal bowel sounds, no distention, no masses, no guarding, no rigidity, no rebound tenderness, no abdominal hernia, no inguinal hernia, no hepatomegaly, no splenomegaly, and abdominal scar around her umbilicus. It is well-healed with no recurrent hernia or dehiscence. Msk:  No deformity or scoliosis noted of thoracic or lumbar spine.   Pulses:  R and L carotid,radial,femoral,dorsalis pedis and posterior tibial pulses are full and equal bilaterally Extremities:  No clubbing, cyanosis, edema, or deformity noted with normal full range of motion of all joints.   Neurologic:  No cranial nerve deficits noted. Station and gait are normal. Plantar reflexes are down-going bilaterally. DTRs are symmetrical throughout. Sensory, motor and coordinative functions appear intact. Skin:  Intact without suspicious lesions or rashes Psych:  Cognition and judgment appear intact. Alert and cooperative with normal attention span and concentration. No apparent delusions, illusions, hallucinations   Impression & Recommendations:  Problem # 1:  HYPERTENSION (ICD-401.9) Assessment Improved  Her updated medication list for this problem includes:    Amlodipine Besylate 10 Mg Tabs (Amlodipine besylate) .Marland Kitchen... Take 1 tablet by mouth once a day  BP today:  102/64 Prior BP: 108/64 (10/27/2009)  10 Yr Risk Heart Disease: 3 % Prior 10 Yr Risk Heart Disease: Not enough information (04/18/2008)  Labs Reviewed: K+: 4.0 (01/17/2009) Creat: : 0.6 (01/17/2009)   Chol: 145 (01/17/2009)   HDL: 64.60 (01/17/2009)   LDL: 69 (01/17/2009)   TG: 58.0 (01/17/2009)  Complete Medication List: 1)  Amlodipine Besylate 10 Mg Tabs (Amlodipine besylate) .... Take 1 tablet by mouth once a day 2)  Proair Hfa 108 (90 Base) Mcg/act Aers (Albuterol sulfate) .... 2 puffs q 6 hrs prn 3)  Protonix 40 Mg Tbec (Pantoprazole sodium) .... Once daily 4)  Miralax Pack (Polyethylene glycol 3350) .... Once daily in 8 ounces of juice  Hypertension Assessment/Plan:      The patient's hypertensive risk group is category B: At least one risk factor (excluding diabetes) with no target organ damage.  Her calculated 10 year risk of coronary heart disease is 3 %.  Today's blood pressure is 102/64.  Her blood pressure goal is < 140/90.  Patient Instructions: 1)  Please schedule a follow-up appointment in 3 months. 2)  Tobacco is very bad for your health and your loved ones! You Should stop smoking!. 3)  Stop Smoking Tips: Choose a Quit date. Cut down before the Quit date. decide what you will do as a substitute when you feel the urge to smoke(gum,toothpick,exercise). 4)  Check your Blood Pressure regularly. If it is above 130/80: you should make an appointment.

## 2010-09-01 NOTE — Assessment & Plan Note (Signed)
Summary: HEADACHES/ FLU SHOT/[facility]  #   Vital Signs:  Patient profile:   60 year old female Height:      58 inches Weight:      87.25 pounds BMI:     18.30 O2 Sat:      95 % on Room air Temp:     97.4 degrees F oral Pulse rate:   72 / minute Pulse rhythm:   regular Resp:     16 per minute BP sitting:   124 / 62  (left arm) Cuff size:   regular  Vitals Entered By: Rock Nephew CMA (May 08, 2010 3:24 PM)  O2 Flow:  Room air  Primary Care Provider:  Etta Grandchild MD  CC:  URI symptoms.  History of Present Illness:  URI Symptoms      This is a 60 year old woman who presents with URI symptoms.  The symptoms began 1 week ago.  The severity is described as mild.  The patient reports nasal congestion, purulent nasal discharge, sore throat, and sick contacts, but denies dry cough, productive cough, and earache.  The patient denies fever, stiff neck, dyspnea, wheezing, rash, vomiting, diarrhea, use of an antipyretic, and response to antipyretic.  The patient also reports muscle aches.  The patient denies headache and severe fatigue.  Risk factors for Strep sinusitis include unilateral facial pain, unilateral nasal discharge, double sickening, and absence of cough.  The patient denies the following risk factors for Strep sinusitis: tooth pain, Strep exposure, and tender adenopathy.    Preventive Screening-Counseling & Management  Alcohol-Tobacco     Alcohol drinks/day: <1     Alcohol type: beer     >5/day in last 3 mos: no     Alcohol Counseling: not indicated; use of alcohol is not excessive or problematic     Feels need to cut down: no     Feels annoyed by complaints: no     Feels guilty re: drinking: no     Needs 'eye opener' in am: no     Smoking Status: current     Smoking Cessation Counseling: yes     Smoke Cessation Stage: contemplative     Packs/Day: 1.0     Tobacco Counseling: to quit use of tobacco products  Hep-HIV-STD-Contraception     Hepatitis Risk: no risk  noted     HIV Risk: no risk noted     STD Risk: no risk noted     SBE monthly: yes     SBE Education/Counseling: to perform regular SBE      Sexual History:  currently monogamous.        Drug Use:  never.        Blood Transfusions:  no.    Medications Prior to Update: 1)  Amlodipine Besylate 10 Mg  Tabs (Amlodipine Besylate) .... Take 1 Tablet By Mouth Once A Day 2)  Proair Hfa 108 (90 Base) Mcg/act  Aers (Albuterol Sulfate) .... 2 Puffs Q 6 Hrs Prn 3)  Protonix 40 Mg Tbec (Pantoprazole Sodium) .... Once Daily 4)  Miralax  Pack (Polyethylene Glycol 3350) .... Once Daily in 8 Ounces of Juice 5)  Tramadol Hcl 50 Mg Tabs (Tramadol Hcl) .Marland Kitchen.. 1-2 By Mouth Qid As Needed For Pain  Current Medications (verified): 1)  Amlodipine Besylate 10 Mg  Tabs (Amlodipine Besylate) .... Take 1 Tablet By Mouth Once A Day 2)  Proair Hfa 108 (90 Base) Mcg/act  Aers (Albuterol Sulfate) .... 2 Puffs Q 6  Hrs Prn 3)  Protonix 40 Mg Tbec (Pantoprazole Sodium) .... Once Daily 4)  Miralax  Pack (Polyethylene Glycol 3350) .... Once Daily in 8 Ounces of Juice 5)  Tramadol Hcl 50 Mg Tabs (Tramadol Hcl) .Marland Kitchen.. 1-2 By Mouth Qid As Needed For Pain 6)  Cefuroxime Axetil 500 Mg Tabs (Cefuroxime Axetil) .... 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 stroke HTN siezure disorder mother with asthma uncle with COPD  Social History: Reviewed history from 09/01/2007 and no changes required. 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    Physical Exam  General:  Well-developed, well-nourished, in no acute distress; alert and oriented x 3.   Head:  Normocephalic and atraumatic without obvious abnormalities. No apparent alopecia or balding. Ears:  R ear normal and L ear normal.   Nose:  External nasal examination shows no deformity or inflammation. Nasal mucosa are pink and moist without lesions or exudates. Mouth:  Oral mucosa and oropharynx without lesions or exudates.  Teeth in good repair. Neck:  supple, full ROM, no masses, no thyromegaly, no thyroid nodules or tenderness, no JVD, normal carotid upstroke, no carotid bruits, no cervical lymphadenopathy, and no neck tenderness.   Lungs:  normal respiratory effort, no intercostal retractions, no accessory muscle use, normal breath sounds, no dullness, no fremitus, no crackles, and no wheezes.   Heart:  normal rate, regular rhythm, no murmur, no gallop, no rub, and no JVD.   Abdomen:  soft, non-tender, normal bowel sounds, no distention, no masses, no guarding, no rigidity, no hepatomegaly, and no splenomegaly.  the incision sight of the hernia looks great. it has healed and there is no recurrence of hernia. Msk:  normal ROM, no joint tenderness, no joint swelling, no joint warmth, no redness over joints, no joint deformities, no joint  instability, no crepitation, and no muscle atrophy.   Pulses:  R and L carotid,radial,femoral,dorsalis pedis and posterior tibial pulses are full and equal bilaterally Extremities:  No clubbing, cyanosis, edema, or deformity noted with normal full range of motion of all joints.   Neurologic:  No cranial nerve deficits noted. Station and gait are normal. Plantar reflexes are down-going bilaterally. DTRs are symmetrical throughout. Sensory, motor and coordinative functions appear intact. Skin:  Intact without suspicious lesions or rashes Cervical Nodes:  no anterior cervical adenopathy and no posterior cervical adenopathy.   Axillary Nodes:  no R axillary adenopathy and no L axillary adenopathy.   Inguinal Nodes:  no R inguinal adenopathy and no L inguinal adenopathy.   Psych:  Cognition and judgment appear intact. Alert and cooperative  with normal attention span and concentration. No apparent delusions, illusions, hallucinations   Impression & Recommendations:  Problem # 1:  SINUSITIS- ACUTE-NOS (ICD-461.9) Assessment New  Her updated medication list for this problem includes:    Cefuroxime Axetil 500 Mg Tabs (Cefuroxime axetil) ..... One by mouth two times a day  Instructed on treatment. Call if symptoms persist or worsen.   Orders: Tobacco use cessation intermediate 3-10 minutes ([address])  Problem # 2:  TOBACCO ABUSE (ICD-305.1) Assessment: Unchanged  Encouraged smoking cessation and discussed different methods for smoking cessation.   Orders: Tobacco use cessation intermediate 3-10 minutes ([address])  Problem # 3:  COPD (ICD-496) Assessment: Unchanged  Her updated medication list for this problem includes:    Proair Hfa 108 (90 Base) Mcg/act Aers (Albuterol sulfate) .Marland Kitchen... 2 puffs q 6 hrs prn  Orders: Tobacco use cessation intermediate 3-10 minutes ([address])   Vaccines Reviewed: Pneumovax: given (08/20/2005)   Flu Vax: Fluvax 3+ (05/08/2010)  Problem # 4:  HYPERTENSION  (ICD-401.9) Assessment: Improved  Her updated medication list for this problem includes:    Amlodipine Besylate 10 Mg Tabs (Amlodipine besylate) .Marland Kitchen... Take 1 tablet by mouth once a day  BP today: 124/62 Prior BP: 102/60 (12/03/2009)  Prior 10 Yr Risk Heart Disease: 3 % (11/13/2009)  Labs Reviewed: K+: 4.0 (01/17/2009) Creat: : 0.6 (01/17/2009)   Chol: 145 (01/17/2009)   HDL: 64.60 (01/17/2009)   LDL: 69 (01/17/2009)   TG: 58.0 (01/17/2009)  Complete Medication List: 1)  Amlodipine Besylate 10 Mg Tabs (Amlodipine besylate) .... Take 1 tablet by mouth once a day 2)  Proair Hfa 108 (90 Base) Mcg/act Aers (Albuterol sulfate) .... 2 puffs q 6 hrs prn 3)  Protonix 40 Mg Tbec (Pantoprazole sodium) .... Once daily 4)  Miralax Pack (Polyethylene glycol 3350) .... Once daily in 8 ounces of juice 5)  Tramadol Hcl 50 Mg Tabs (Tramadol hcl) .Marland Kitchen.. 1-2 by mouth qid as needed for pain 6)  Cefuroxime Axetil 500 Mg Tabs (Cefuroxime axetil) .... One by mouth two times a day  Other Orders: Admin 1st Vaccine ([address]) Flu Vaccine 38yrs + ([address]78) Flu Vaccine Consent Questions     Do you have a history of severe allergic reactions to this vaccine? no    Any prior history of allergic reactions to egg and/or gelatin? no    Do you have a sensitivity to the preservative Thimersol? no    Do you have a past history of Guillan-Barre Syndrome? no    Do you currently have an acute febrile illness? no    Have you ever had a severe reaction to latex? no    Vaccine information given and explained to patient? yes    Are you currently pregnant? no    Lot Number:[id number]   Exp Date:01/30/2011   Site Given  Left Deltoid IM  Patient Instructions: 1)  Please schedule a follow-up appointment in 2 weeks. 2)  Tobacco is very bad for your health and your loved ones! You Should stop smoking!. 3)  Stop Smoking Tips: Choose a Quit date. Cut down before the Quit date. decide what you will do as a substitute when you feel the  urge to smoke(gum,toothpick,exercise). 4)  Take your antibiotic as prescribed until ALL of it is gone, but stop if you develop a rash or swelling and contact our office as soon as possible. 5)  Acute sinusitis symptoms for less than 10 days are not helped by antibiotics.Use warm moist compresses, and over the counter decongestants (  only as directed). Call if no improvement in 5-7 days, sooner if increasing pain, fever, or new symptoms. Prescriptions: CEFUROXIME AXETIL 500 MG TABS (CEFUROXIME AXETIL) One by mouth two times a day  #20 x 1   Entered and Authorized by:   Etta Grandchild MD   Signed by:   Etta Grandchild MD on 05/08/2010   Method used:   Electronically to        [facility]  [address] [phone]* (retail)       [address]       [place], [place]  [address]       Ph: [phone] or [phone]       Fax: [phone]   RxID:   [id number]    .lbflu

## 2010-09-01 NOTE — Letter (Signed)
Summary: Results Follow-up Letter  [facility]  [address] [place], [place] [address]   Phone: [phone]  Fax: [phone]    10/27/2009  [address] [place], [place]  [address]  Dear Amber Griffith,   The following are the results of your recent test(s):  Test     Result     Xray       osteoporosis, no fracture or arthritis   _________________________________________________________  Please call for an appointment in 2-3 weeks _________________________________________________________ _________________________________________________________ _________________________________________________________  Sincerely,  Sanda Linger MD  [facility]

## 2010-09-01 NOTE — Assessment & Plan Note (Signed)
Summary: 2 WK FU---STC   Vital Signs:  Patient profile:   60 year old female Menstrual status:  postmenopausal Height:      58 inches Weight:      86 pounds BMI:     18.04 O2 Sat:      97 % on Room air Temp:     98.1 degrees F oral Pulse rate:   68 / minute Pulse rhythm:   regular Resp:     16 per minute BP sitting:   128 / 60  (left arm)  Vitals Entered By: Rock Nephew CMA (May 22, 2010 3:53 PM)  O2 Flow:  Room air  Primary Care Provider:  Etta Grandchild MD  CC:  Heartburn.  History of Present Illness:  Heartburn      This is a 60 year old woman who presents with Heartburn.  The symptoms began 6 days ago.  The intensity is described as mild.  The patient reports acid reflux and sour taste in mouth, but denies epigastric pain, chest pain, trouble swallowing, weight loss, and weight gain.  The patient denies the following alarm features: melena, dysphagia, hematemesis, vomiting, involuntary weight loss >5%, and history of anemia.  Symptoms are worse with spicy foods and coffee.  Treatment that was tried and either found to be ineffective or stopped due to problems include dietary changes, weight loss, and elevating the head of bed.    Preventive Screening-Counseling & Management  Alcohol-Tobacco     Alcohol drinks/day: <1     Alcohol type: beer     >5/day in last 3 mos: no     Alcohol Counseling: not indicated; use of alcohol is not excessive or problematic     Feels need to cut down: no     Feels annoyed by complaints: no     Feels guilty re: drinking: no     Needs 'eye opener' in am: no     Smoking Status: current     Smoking Cessation Counseling: yes     Smoke Cessation Stage: contemplative     Packs/Day: 1.0     Tobacco Counseling: to quit use of tobacco products  Hep-HIV-STD-Contraception     Hepatitis Risk: no risk noted     HIV Risk: no risk noted     STD Risk: no risk noted     SBE monthly: yes     SBE Education/Counseling: to perform regular  SBE  Medications Prior to Update: 1)  Amlodipine Besylate 10 Mg  Tabs (Amlodipine Besylate) .... Take 1 Tablet By Mouth Once A Day 2)  Proair Hfa 108 (90 Base) Mcg/act  Aers (Albuterol Sulfate) .... 2 Puffs Q 6 Hrs Prn 3)  Protonix 40 Mg Tbec (Pantoprazole Sodium) .... Once Daily 4)  Miralax  Pack (Polyethylene Glycol 3350) .... Once Daily in 8 Ounces of Juice 5)  Tramadol Hcl 50 Mg Tabs (Tramadol Hcl) .Marland Kitchen.. 1-2 By Mouth Qid As Needed For Pain  Current Medications (verified): 1)  Amlodipine Besylate 10 Mg  Tabs (Amlodipine Besylate) .... Take 1 Tablet By Mouth Once A Day 2)  Proair Hfa 108 (90 Base) Mcg/act  Aers (Albuterol Sulfate) .... 2 Puffs Q 6 Hrs Prn 3)  Miralax  Pack (Polyethylene Glycol 3350) .... Once Daily in 8 Ounces of Juice 4)  Tramadol Hcl 50 Mg Tabs (Tramadol Hcl) .Marland Kitchen.. 1-2 By Mouth Qid As Needed For Pain 5)  Nexium 40 Mg Cpdr (Esomeprazole Magnesium) .... 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 stroke HTN siezure disorder mother with asthma uncle with COPD  Social History: Reviewed history from 09/01/2007 and no changes required. Current Smoker Alcohol  use-yes work - custodial 1 son Divorced  Review of Systems       The patient complains of severe indigestion/heartburn.  The patient denies anorexia, fever, weight loss, abdominal pain, melena, hematochezia, suspicious skin lesions, enlarged lymph nodes, and angioedema.   ENT:  Denies earache, hoarseness, nasal congestion, nosebleeds, postnasal drainage, ringing in ears, sinus pressure, and sore throat.  Physical Exam  General:  Well-developed, well-nourished, in no acute distress; alert and oriented x 3.   Head:  Normocephalic and atraumatic without obvious abnormalities. No apparent alopecia or balding. Mouth:  Oral mucosa and oropharynx without lesions or exudates.  Teeth in good repair. Neck:  supple, full ROM, no masses, no thyromegaly, no thyroid nodules or tenderness, no JVD, normal carotid upstroke, no carotid bruits, no cervical lymphadenopathy, and no neck tenderness.   Lungs:  normal respiratory effort, no intercostal retractions, no accessory muscle use, normal breath sounds, no dullness, no fremitus, no crackles, and no wheezes.   Heart:  normal rate, regular rhythm, no murmur, no gallop, no rub, and no JVD.   Abdomen:  soft, non-tender, normal bowel sounds, no distention, no masses, no guarding, no rigidity, no hepatomegaly, and no splenomegaly.  the incision sight of the hernia looks great. it has healed and there is no recurrence of hernia. Msk:  normal ROM, no joint tenderness, no joint swelling, no joint warmth, no redness over joints, no joint deformities, no joint instability, no crepitation, and no muscle atrophy.   Pulses:  R and L carotid,radial,femoral,dorsalis pedis and posterior tibial pulses are full and equal bilaterally Extremities:  No clubbing, cyanosis, edema, or deformity noted with normal full range of motion of all joints.   Neurologic:  No cranial nerve deficits noted. Station and gait are normal. Plantar reflexes are down-going bilaterally. DTRs are  symmetrical throughout. Sensory, motor and coordinative functions appear intact. Skin:  Intact without suspicious lesions or rashes Psych:  Cognition and judgment appear intact. Alert and cooperative with normal attention span and concentration. No apparent delusions, illusions, hallucinations   Impression & Recommendations:  Problem # 1:  SINUSITIS- ACUTE-NOS (ICD-461.9) Assessment Improved  Problem # 2:  HYPERTENSION (ICD-401.9) Assessment: Improved  Her updated medication list for this problem includes:    Amlodipine Besylate 10 Mg Tabs (Amlodipine besylate) .Marland Kitchen... Take 1 tablet by mouth once a day  BP today: 128/60 Prior BP: 124/62 (05/08/2010)  Prior 10 Yr Risk Heart Disease: 3 % (11/13/2009)  Labs Reviewed: K+: 4.0 (01/17/2009) Creat: : 0.6 (01/17/2009)   Chol: 145 (01/17/2009)   HDL: 64.60 (01/17/2009)   LDL: 69 (01/17/2009)   TG: 58.0 (01/17/2009)  Problem # 3:  GERD (ICD-530.81) Assessment: Deteriorated  The following medications were removed  from the medication list:    Protonix 40 Mg Tbec (Pantoprazole sodium) ..... Once daily Her updated medication list for this problem includes:    Nexium 40 Mg Cpdr (Esomeprazole magnesium) ..... One by mouth once daily for heartburn  Labs Reviewed: Hgb: 11.9 (01/17/2009)   Hct: 36.1 (01/17/2009)  Complete Medication List: 1)  Amlodipine Besylate 10 Mg Tabs (Amlodipine besylate) .... Take 1 tablet by mouth once a day 2)  Proair Hfa 108 (90 Base) Mcg/act Aers (Albuterol sulfate) .... 2 puffs q 6 hrs prn 3)  Miralax Pack (Polyethylene glycol 3350) .... Once daily in 8 ounces of juice 4)  Tramadol Hcl 50 Mg Tabs (Tramadol hcl) .Marland Kitchen.. 1-2 by mouth qid as needed for pain 5)  Nexium 40 Mg Cpdr (Esomeprazole magnesium) .... One by mouth once daily for heartburn  Patient Instructions: 1)  Please schedule a follow-up appointment in 2 months. 2)  Avoid foods high in acid (tomatoes, citrus juices, spicy foods). Avoid eating within two  hours of lying down or before exercising. Do not over eat; try smaller more frequent meals. Elevate head of bed twelve inches when sleeping. 3)  Tobacco is very bad for your health and your loved ones! You Should stop smoking!. 4)  Stop Smoking Tips: Choose a Quit date. Cut down before the Quit date. decide what you will do as a substitute when you feel the urge to smoke(gum,toothpick,exercise). Prescriptions: NEXIUM 40 MG CPDR (ESOMEPRAZOLE MAGNESIUM) One by mouth once daily for heartburn  #60 x 0   Entered and Authorized by:   Etta Grandchild MD   Signed by:   Etta Grandchild MD on 05/22/2010   Method used:   Samples Given   RxID:   [id number]    Orders Added: 1)  Est. Patient Level IV [[address]]

## 2010-09-02 ENCOUNTER — Encounter: Payer: Self-pay | Admitting: Internal Medicine

## 2010-09-02 ENCOUNTER — Ambulatory Visit (INDEPENDENT_AMBULATORY_CARE_PROVIDER_SITE_OTHER): Payer: BC Managed Care – PPO | Admitting: Internal Medicine

## 2010-09-02 DIAGNOSIS — I10 Essential (primary) hypertension: Secondary | ICD-10-CM

## 2010-09-02 DIAGNOSIS — F17200 Nicotine dependence, unspecified, uncomplicated: Secondary | ICD-10-CM

## 2010-09-02 DIAGNOSIS — J019 Acute sinusitis, unspecified: Secondary | ICD-10-CM

## 2010-09-04 NOTE — Letter (Signed)
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

## 2010-09-05 ENCOUNTER — Encounter: Payer: Self-pay | Admitting: Internal Medicine

## 2010-09-09 NOTE — Assessment & Plan Note (Signed)
Summary: sinus inf?CD   Vital Signs:  Patient profile:   60 year old female Menstrual status:  postmenopausal Height:      58 inches Weight:      88 pounds O2 Sat:      98 % on Room air Temp:     98.6 degrees F oral Pulse rate:   64 / minute Pulse rhythm:   regular Resp:     16 per minute BP sitting:   116 / 74  (left arm) Cuff size:   regular  Vitals Entered By: Rock Nephew CMA (September 02, 2010 2:15 PM)  O2 Flow:  Room air CC: Patient c/o runny eyes, bodyache, and hot/cold flashes, URI symptoms Is Patient Diabetic? No Pain Assessment Patient in pain? no       Does patient need assistance? Functional Status Self care Ambulation Normal   Primary Care Provider:  Etta Grandchild MD  CC:  Patient c/o runny eyes, bodyache, and hot/cold flashes, and URI symptoms.  History of Present Illness:  URI Symptoms      This is a 60 year old woman who presents with URI symptoms.  The symptoms began 2 weeks ago.  The patient reports nasal congestion, purulent nasal discharge, sore throat, and sick contacts, but denies clear nasal discharge, dry cough, productive cough, and earache.  The patient denies fever, stiff neck, dyspnea, wheezing, rash, vomiting, diarrhea, use of an antipyretic, and response to antipyretic.  The patient also reports muscle aches and severe fatigue.  The patient denies itchy throat, sneezing, seasonal symptoms, and headache.  Risk factors for Strep sinusitis include unilateral facial pain, unilateral nasal discharge, poor response to decongestant, and double sickening.  The patient denies the following risk factors for Strep sinusitis: tooth pain, Strep exposure, tender adenopathy, and absence of cough.    Preventive Screening-Counseling & Management  Alcohol-Tobacco     Alcohol drinks/day: <1     Alcohol type: beer     >5/day in last 3 mos: no     Alcohol Counseling: not indicated; use of alcohol is not excessive or problematic     Feels need to cut down:  no     Feels annoyed by complaints: no     Feels guilty re: drinking: no     Needs 'eye opener' in am: no     Smoking Status: current     Smoking Cessation Counseling: yes     Smoke Cessation Stage: contemplative     Packs/Day: 1.0     Pack years: 40     Tobacco Counseling: to quit use of tobacco products  Hep-HIV-STD-Contraception     Hepatitis Risk: no risk noted     HIV Risk: no risk noted     STD Risk: no risk noted     SBE monthly: yes     SBE Education/Counseling: to perform regular SBE      Sexual History:  currently monogamous.        Drug Use:  never.        Blood Transfusions:  no.    Clinical Review Panels:  Prevention   Last Mammogram:  ASSESSMENT: Negative - BI-RADS 1^MM DIGITAL SCREENING (08/26/2009)   Last Pap Smear:  NEGATIVE FOR INTRAEPITHELIAL LESIONS OR MALIGNANCY. (01/17/2009)   Last Colonoscopy:  normal (06/23/2007)  Immunizations   Last Flu Vaccine:  Fluvax 3+ (05/08/2010)   Last Pneumovax:  given (08/20/2005)  Lipid Management   Cholesterol:  145 (01/17/2009)   LDL (bad choesterol):  69 (  01/17/2009)   HDL (good cholesterol):  64.60 (01/17/2009)  Diabetes Management   Creatinine:  0.6 (01/17/2009)   Last Flu Vaccine:  Fluvax 3+ (05/08/2010)   Last Pneumovax:  given (08/20/2005)  CBC   WBC:  5.7 (01/17/2009)   RBC:  4.00 (01/17/2009)   Hgb:  11.9 (01/17/2009)   Hct:  36.1 (01/17/2009)   Platelets:  221.0 (01/17/2009)   MCV  90.2 (01/17/2009)   MCHC  32.9 (01/17/2009)   RDW  12.4 (01/17/2009)   PMN:  53.1 (01/17/2009)   Lymphs:  35.0 (01/17/2009)   Monos:  10.4 (01/17/2009)   Eosinophils:  1.3 (01/17/2009)   Basophil:  0.2 (01/17/2009)  Complete Metabolic Panel   Glucose:  88 (01/17/2009)   Sodium:  145 (01/17/2009)   Potassium:  4.0 (01/17/2009)   Chloride:  107 (01/17/2009)   CO2:  29 (01/17/2009)   BUN:  14 (01/17/2009)   Creatinine:  0.6 (01/17/2009)   Albumin:  4.3 (01/17/2009)   Total Protein:  7.1 (01/17/2009)   Calcium:   9.0 (01/17/2009)   Total Bili:  0.9 (01/17/2009)   Alk Phos:  57 (01/17/2009)   SGPT (ALT):  18 (01/17/2009)   SGOT (AST):  21 (01/17/2009)   Medications Prior to Update: 1)  Amlodipine Besylate 10 Mg  Tabs (Amlodipine Besylate) .... Take 1 Tablet By Mouth Once A Day 2)  Proair Hfa 108 (90 Base) Mcg/act  Aers (Albuterol Sulfate) .... 2 Puffs Q 6 Hrs Prn 3)  Miralax  Pack (Polyethylene Glycol 3350) .... Once Daily in 8 Ounces of Juice 4)  Tramadol Hcl 50 Mg Tabs (Tramadol Hcl) .Marland Kitchen.. 1-2 By Mouth Qid As Needed For Pain 5)  Nexium 40 Mg Cpdr (Esomeprazole Magnesium) .... One By Mouth Once Daily For Heartburn  Current Medications (verified): 1)  Amlodipine Besylate 10 Mg  Tabs (Amlodipine Besylate) .... Take 1 Tablet By Mouth Once A Day 2)  Proair Hfa 108 (90 Base) Mcg/act  Aers (Albuterol Sulfate) .... 2 Puffs Q 6 Hrs Prn 3)  Miralax  Pack (Polyethylene Glycol 3350) .... Once Daily in 8 Ounces of Juice 4)  Nexium 40 Mg Cpdr (Esomeprazole Magnesium) .... One By Mouth Once Daily For Heartburn 5)  Amoxicillin 500 Mg Cap (Amoxicillin) .... 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 stroke HTN siezure disorder mother with asthma uncle with COPD  Social History: Reviewed history from 09/01/2007 and no changes required. 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    Physical Exam  General:  Well-developed, well-nourished, in  no acute distress; alert and oriented x 3.   Head:  Normocephalic and atraumatic without obvious abnormalities. No apparent alopecia or balding. Ears:  R ear normal and L ear normal.   Nose:  no external deformity, no airflow obstruction, no intranasal foreign body, no nasal polyps, no nasal mucosal lesions, no mucosal friability, no active bleeding or clots, no septum abnormalities, nasal dischargemucosal pallor, mucosal edema, L maxillary sinus tenderness, and R maxillary sinus tenderness.   Mouth:  Oral mucosa and oropharynx without lesions or exudates.  Teeth in good repair. Neck:  supple, full ROM, no masses, no thyromegaly, no thyroid nodules or tenderness, no JVD, normal carotid upstroke, no carotid bruits, no cervical lymphadenopathy, and no neck tenderness.   Lungs:  normal respiratory effort, no intercostal retractions, no accessory muscle use, normal breath sounds, no dullness, no fremitus, no crackles, and no wheezes.   Heart:  normal rate, regular rhythm, no murmur, no gallop, no rub, and no JVD.   Abdomen:  soft, non-tender, normal bowel sounds, no distention, no masses, no guarding, no rigidity, no hepatomegaly, and no splenomegaly.  the incision sight of the hernia looks great. it has healed and there is no recurrence of hernia. Msk:  normal ROM, no  joint tenderness, no joint swelling, no joint warmth, no redness over joints, no joint deformities, no joint instability, no crepitation, and no muscle atrophy.   Pulses:  R and L carotid,radial,femoral,dorsalis pedis and posterior tibial pulses are full and equal bilaterally Extremities:  No clubbing, cyanosis, edema, or deformity noted with normal full range of motion of all joints.   Neurologic:  No cranial nerve deficits noted. Station and gait are normal. Plantar reflexes are down-going bilaterally. DTRs are symmetrical throughout. Sensory, motor and coordinative functions appear intact. Skin:  Intact without suspicious lesions or rashes Cervical Nodes:  no anterior cervical adenopathy and no posterior cervical adenopathy.   Psych:  Cognition and judgment appear intact. Alert and cooperative with normal attention span and concentration. No apparent delusions, illusions, hallucinations   Impression & Recommendations:  Problem # 1:  SINUSITIS- ACUTE-NOS (ICD-461.9) Assessment Deteriorated  Her updated medication list for this problem includes:    Amoxicillin 500 Mg Cap (Amoxicillin) .Marland Kitchen... Take 1 capsule by mouth three times a day x 10 days  Instructed on treatment. Call if symptoms persist or worsen.   Orders: Tobacco use cessation intermediate 3-10 minutes (04540)  Problem # 2:  HYPERTENSION (ICD-401.9) Assessment: Improved  Her updated medication list for this problem includes:    Amlodipine Besylate 10 Mg Tabs (Amlodipine besylate) .Marland Kitchen... Take 1 tablet by mouth once a day  BP today: 116/74 Prior BP: 128/60 (05/22/2010)  Prior 10 Yr Risk Heart Disease: 3 % (11/13/2009)  Labs Reviewed: K+: 4.0 (01/17/2009) Creat: : 0.6 (01/17/2009)   Chol: 145 (01/17/2009)   HDL: 64.60 (01/17/2009)   LDL: 69 (01/17/2009)   TG: 58.0 (01/17/2009)  Orders: Tobacco use cessation intermediate 3-10 minutes (98119)  Problem # 3:  TOBACCO USE (ICD-305.1) Assessment: Unchanged  Encouraged smoking  cessation and discussed different methods for smoking cessation.   Orders: Tobacco use cessation intermediate 3-10 minutes (99406)  Complete Medication List: 1)  Amlodipine Besylate 10 Mg Tabs (Amlodipine besylate) .... Take 1 tablet by mouth once a day 2)  Proair Hfa 108 (90 Base) Mcg/act Aers (Albuterol sulfate) .... 2 puffs q 6 hrs prn 3)  Miralax Pack (Polyethylene glycol 3350) .... Once daily in 8 ounces of juice 4)  Nexium 40 Mg Cpdr (Esomeprazole  magnesium) .... One by mouth once daily for heartburn 5)  Amoxicillin 500 Mg Cap (Amoxicillin) .... Take 1 capsule by mouth three times a day x 10 days  Patient Instructions: 1)  Please schedule a follow-up appointment in 2 weeks. 2)  Tobacco is very bad for your health and your loved ones! You Should stop smoking!. 3)  Stop Smoking Tips: Choose a Quit date. Cut down before the Quit date. decide what you will do as a substitute when you feel the urge to smoke(gum,toothpick,exercise). 4)  Check your Blood Pressure regularly. If it is above 140/90: you should make an appointment. 5)  Take your antibiotic as prescribed until ALL of it is gone, but stop if you develop a rash or swelling and contact our office as soon as possible. 6)  Acute sinusitis symptoms for less than 10 days are not helped by antibiotics.Use warm moist compresses, and over the counter decongestants ( only as directed). Call if no improvement in 5-7 days, sooner if increasing pain, fever, or new symptoms. Prescriptions: AMOXICILLIN 500 MG CAP (AMOXICILLIN) Take 1 capsule by mouth three times a day X 10 days  #30 x 0   Entered and Authorized by:   Etta Grandchild MD   Signed by:   Etta Grandchild MD on 09/02/2010   Method used:   Electronically to        [facility] [address] [phone]* (retail)       [address]       [place], [place]  [address]       Ph: [phone] or [phone]       Fax: [phone]   RxID:   [id number]    Orders  Added: 1)  Est. Patient Level IV [18841] 2)  Tobacco use cessation intermediate 3-10 minutes [99406]

## 2010-09-09 NOTE — Letter (Signed)
Summary: Out of Work  [facility]  [address] [place], [place] [address]   Phone: [phone]  Fax: [phone]    September 02, 2010   Employee:  Amber Griffith [facility] [employer] [facility]    To Whom It May Concern:   For Medical reasons, please excuse the above named employee from work for the following dates:  Start:   08/31/10  End:   09/07/10  If you need additional information, please feel free to contact our office.         Sincerely,    Etta Grandchild MD

## 2010-09-16 ENCOUNTER — Ambulatory Visit: Payer: BC Managed Care – PPO | Admitting: Internal Medicine

## 2010-09-18 ENCOUNTER — Other Ambulatory Visit: Payer: Self-pay | Admitting: Internal Medicine

## 2010-09-18 DIAGNOSIS — Z1231 Encounter for screening mammogram for malignant neoplasm of breast: Secondary | ICD-10-CM

## 2010-09-30 ENCOUNTER — Encounter: Payer: Self-pay | Admitting: Internal Medicine

## 2010-09-30 ENCOUNTER — Other Ambulatory Visit: Payer: Self-pay | Admitting: Internal Medicine

## 2010-09-30 ENCOUNTER — Other Ambulatory Visit (HOSPITAL_COMMUNITY)
Admission: RE | Admit: 2010-09-30 | Discharge: 2010-09-30 | Disposition: A | Discharge status: Home / Self Care | Payer: BC Managed Care – PPO | Source: Ambulatory Visit | Attending: Internal Medicine | Admitting: Internal Medicine

## 2010-09-30 ENCOUNTER — Ambulatory Visit (INDEPENDENT_AMBULATORY_CARE_PROVIDER_SITE_OTHER): Payer: BC Managed Care – PPO | Admitting: Internal Medicine

## 2010-09-30 DIAGNOSIS — Z01419 Encounter for gynecological examination (general) (routine) without abnormal findings: Secondary | ICD-10-CM | POA: Insufficient documentation

## 2010-09-30 DIAGNOSIS — N390 Urinary tract infection, site not specified: Secondary | ICD-10-CM | POA: Insufficient documentation

## 2010-09-30 DIAGNOSIS — N760 Acute vaginitis: Secondary | ICD-10-CM | POA: Insufficient documentation

## 2010-09-30 DIAGNOSIS — J019 Acute sinusitis, unspecified: Secondary | ICD-10-CM

## 2010-09-30 DIAGNOSIS — Z113 Encounter for screening for infections with a predominantly sexual mode of transmission: Secondary | ICD-10-CM | POA: Insufficient documentation

## 2010-09-30 HISTORY — DX: Urinary tract infection, site not specified: N39.0

## 2010-09-30 HISTORY — DX: Acute vaginitis: N76.0

## 2010-09-30 LAB — URINALYSIS, ROUTINE W REFLEX MICROSCOPIC
Ketones, ur: NEGATIVE
Specific Gravity, Urine: 1.02 (ref 1.000–1.030)
Total Protein, Urine: NEGATIVE
Urine Glucose: NEGATIVE
Urobilinogen, UA: 0.2 (ref 0.0–1.0)

## 2010-09-30 LAB — WET PREP, GENITAL: Trich, Wet Prep: NONE SEEN

## 2010-10-01 ENCOUNTER — Ambulatory Visit (HOSPITAL_COMMUNITY)
Admission: RE | Admit: 2010-10-01 | Discharge: 2010-10-01 | Disposition: A | Discharge status: Home / Self Care | Payer: BC Managed Care – PPO | Source: Ambulatory Visit | Attending: Internal Medicine | Admitting: Internal Medicine

## 2010-10-01 DIAGNOSIS — Z1231 Encounter for screening mammogram for malignant neoplasm of breast: Secondary | ICD-10-CM

## 2010-10-05 ENCOUNTER — Telehealth: Payer: Self-pay | Admitting: Internal Medicine

## 2010-10-08 NOTE — Assessment & Plan Note (Signed)
Summary: FOLLOW UP / YEAST INFECTION / NWS   Vital Signs:  Patient profile:   60 year old female Menstrual status:  postmenopausal Height:      58 inches (147.32 cm) Weight:      89.50 pounds (40.68 kg) BMI:     18.77 O2 Sat:      99 % on Room air Temp:     97.6 degrees F (36.44 degrees C) oral Pulse rate:   67 / minute Pulse rhythm:   regular Resp:     14 per minute BP sitting:   110 / 62  (left arm) Cuff size:   regular  Vitals Entered By: Burnard Leigh CMA(AAMA) (September 30, 2010 3:24 PM)  O2 Flow:  Room air CC: F/U from sinus infection.Possible yeast infection from antibiotic/sls, cma,  Is Patient Diabetic? No   Primary Care Provider:  Etta Grandchild MD  CC:  F/U from sinus infection.Possible yeast infection from antibiotic/sls, cma, and .  History of Present Illness:  Vaginal Discharge      This is a 60 year old woman who presents with Vaginal discharge.  The symptoms began 5 days ago.  The severity is described as mild.  The patient reports itching, vaginal burning, and burning on urination, but denies frequency, urgency, fever, pelvic pain, and back pain.  The discharge is described as scant.  Risk factors for vaginal discharge include recent antibiotics.  The patient denies the following symptoms: genital sores, unusual vaginal bleeding, painful intercourse, rash, myalgias, arthralgias, and headache.  Prior treatment has included OTC antifungals.    Preventive Screening-Counseling & Management  Alcohol-Tobacco     Smoking Cessation Counseling: yes  Current Medications (verified): 1)  Amlodipine Besylate 10 Mg  Tabs (Amlodipine Besylate) .... Take 1 Tablet By Mouth Once A Day 2)  Proair Hfa 108 (90 Base) Mcg/act  Aers (Albuterol Sulfate) .... 2 Puffs Q 6 Hrs Prn 3)  Miralax  Pack (Polyethylene Glycol 3350) .... Once Daily in 8 Ounces of Juice 4)  Nexium 40 Mg Cpdr (Esomeprazole Magnesium) .... One By Mouth Once Daily For Heartburn 5)  Aspirin 81mg  .... 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 stroke HTN siezure disorder mother with asthma uncle with COPD  Social History: Reviewed history from 09/01/2007 and no changes required. 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   ENT:  Denies ear discharge, earache, hoarseness, nasal congestion, nosebleeds, postnasal drainage, ringing in ears, sinus pressure, and sore throat. GU:  Complains of dysuria; denies abnormal vaginal bleeding, decreased libido, discharge, genital sores, hematuria, incontinence, nocturia, urinary frequency, and urinary hesitancy.  Physical Exam  General:  Well-developed, well-nourished, in no acute distress; alert and oriented x 3.   Head:  Normocephalic and atraumatic without obvious abnormalities. No apparent alopecia or balding. Mouth:  Oral  mucosa and oropharynx without lesions or exudates.  Teeth in good repair. Neck:  supple, full ROM, no masses, no thyromegaly, no thyroid nodules or tenderness, no JVD, normal carotid upstroke, no carotid bruits, no cervical lymphadenopathy, and no neck tenderness.   Lungs:  normal respiratory effort, no intercostal retractions, no accessory muscle use, normal breath sounds, no dullness, no fremitus, no crackles, and no wheezes.   Heart:  normal rate, regular rhythm, no murmur, no gallop, no rub, and no JVD.   Abdomen:  soft, non-tender, normal bowel sounds, no distention, no masses, no guarding, no rigidity, no hepatomegaly, and no splenomegaly.  the incision sight of the hernia looks great. it has healed and there is no recurrence of hernia. Rectal:  normal sphincter tone, no masses, no tenderness, no fissures, no fistulae, no perianal rash, and external hemorrhoid(s).   Genitalia:  normal introitus, no external lesions, no vaginal discharge, mucosa pink and moist, no vaginal or cervical lesions, no vaginal atrophy, no friaility or hemorrhage, normal uterus size and position, and no adnexal masses or tenderness.   Msk:  normal ROM, no joint tenderness, no joint swelling, no joint warmth, no redness over joints, no joint deformities, no joint instability, no crepitation, and no muscle atrophy.   Extremities:  No clubbing, cyanosis, edema, or deformity noted with normal full range of motion of all joints.   Neurologic:  No cranial nerve deficits noted. Station and gait are normal. Plantar reflexes are down-going bilaterally. DTRs are symmetrical throughout. Sensory, motor and coordinative functions appear intact. Skin:  Intact without suspicious lesions or rashes Inguinal Nodes:  no R inguinal adenopathy and no L inguinal adenopathy.   Psych:  Cognition and judgment appear intact. Alert and cooperative with normal attention span and concentration. No apparent delusions, illusions,  hallucinations   Impression & Recommendations:  Problem # 1:  UTI (ICD-599.0) Assessment New  Her updated medication list for this problem includes:    Metronidazole 250 Mg Tabs (Metronidazole) ..... One by mouth two times a day for 7 days  Orders: TLB-Udip w/ Micro (81001-URINE)  Problem # 2:  VAGINITIS (ICD-616.10) Assessment: New  Her updated medication list for this problem includes:    Metronidazole 250 Mg Tabs (Metronidazole) ..... One by mouth two times a day for 7 days  Orders: Wet Prep (16109UE) TLB-Wet Mount / Fungus (87210-WPREP)  Problem # 3:  BACTERIAL VAGINITIS (ICD-616.10) Assessment: New  Her updated medication list for this problem includes:    Metronidazole 250 Mg Tabs (Metronidazole) ..... One by mouth two times a day for 7 days  Orders: Wet Prep (45409WJ) TLB-Wet Mount / Fungus (87210-WPREP)  Discussed symptomatic relief and treatment options.   Problem # 4:  SINUSITIS- ACUTE-NOS (ICD-461.9) Assessment: Improved  Her updated medication list for this problem includes:    Metronidazole 250 Mg Tabs (Metronidazole) ..... One by mouth two times a day for 7 days  Instructed on treatment. Call if symptoms persist or worsen.   Complete Medication List: 1)  Amlodipine Besylate 10 Mg Tabs (Amlodipine besylate) .... Take 1 tablet by mouth once a day 2)  Proair Hfa 108 (90 Base) Mcg/act Aers (Albuterol sulfate) .... 2 puffs q 6 hrs prn 3)  Miralax Pack (Polyethylene glycol 3350) .... Once daily in 8 ounces of juice 4)  Nexium 40 Mg Cpdr (Esomeprazole magnesium) .... One by mouth once daily for heartburn 5)  Aspirin 81mg   .... Once daily 6)  Metronidazole 250 Mg Tabs (Metronidazole) .... One by mouth two times a day for 7 days  Colorectal Screening:  Current Recommendations:    Hemoccult: NEG X 1 today  PAP Screening:    Hx Cervical Dysplasia in last 5 yrs? No    3 normal PAP smears in last 5 yrs? Yes    Last PAP smear:  01/17/2009    Reviewed PAP  smear recommendations:  PAP smear done  Mammogram Screening:    Last Mammogram:  08/26/2009  Osteoporosis Risk Assessment:  Risk Factors for Fracture or Low Bone Density:   Smoking status:       current  Immunization & Chemoprophylaxis:    Influenza vaccine: Fluvax 3+  (05/08/2010)    Pneumovax: given  (08/20/2005)  Patient Instructions: 1)  Please schedule a follow-up appointment in 2 weeks. 2)  Take your antibiotic as prescribed until ALL of it is gone, but stop if you develop a rash or swelling and contact our office as soon as possible. 3)  Tobacco is very bad for your health and your loved ones! You Should stop smoking!. 4)  Stop Smoking Tips: Choose a Quit date. Cut down before the Quit date. decide what you will do as a substitute when you feel the urge to smoke(gum,toothpick,exercise). Prescriptions: METRONIDAZOLE 250 MG TABS (METRONIDAZOLE) One by mouth two times a day for 7 days  #14 x 0   Entered and Authorized by:   Etta Grandchild MD   Signed by:   Etta Grandchild MD on 09/30/2010   Method used:   [facility] to        [facility] [address] [phone]* (retail)       [address]       [place], [place]  [address]       Ph: [phone] or [phone]       Fax: [phone]   RxID:   [id number]    Orders Added: 1)  Wet Prep [33295JO] 2)  TLB-Udip w/ Micro [81001-URINE] 3)  TLB-Wet Mount / Fungus [87210-WPREP] 4)  Est. Patient Level IV [84166]

## 2010-10-13 NOTE — Progress Notes (Signed)
    PAP Screening:    Last PAP smear:  09/30/2010  Mammogram Screening:    Last Mammogram:  10/01/2010  Mammogram Results:    Date of Exam:  10/01/2010    Results:  Normal Bilateral  Osteoporosis Risk Assessment:  Risk Factors for Fracture or Low Bone Density:   Smoking status:       current  Immunization & Chemoprophylaxis:    Influenza vaccine: Fluvax 3+  (05/08/2010)    Pneumovax: given  (08/20/2005)

## 2010-10-21 LAB — BASIC METABOLIC PANEL
Calcium: 8.9 mg/dL (ref 8.4–10.5)
GFR calc Af Amer: >60 mL/min (ref >60)
GFR calc non Af Amer: >60 mL/min (ref >60)
Glucose, Bld: 95 mg/dL (ref 70–99)
Potassium: 3.3 mEq/L — ABNORMAL LOW (ref 3.5–5.1)
Sodium: 137 mEq/L (ref 135–145)

## 2010-10-21 LAB — CBC
Hemoglobin: 11.9 g/dL — ABNORMAL LOW (ref 12.0–15.0)
MCHC: 33.2 g/dL (ref 30.0–36.0)
RBC: 3.88 MIL/uL (ref 3.87–5.11)
RDW: 13.6 % (ref 11.5–15.5)
RDW: 13.9 % (ref 11.5–15.5)
WBC: 6.7 10*3/uL (ref 4.0–10.5)

## 2010-10-21 LAB — URINALYSIS, ROUTINE W REFLEX MICROSCOPIC
Ketones, ur: 15 mg/dL — AB
Leukocytes, UA: NEGATIVE
Nitrite: NEGATIVE
Specific Gravity, Urine: 1.017 (ref 1.005–1.030)
Urobilinogen, UA: 0.2 mg/dL (ref 0.0–1.0)
pH: 8 (ref 5.0–8.0)

## 2010-10-21 LAB — POCT I-STAT, CHEM 8
Calcium, Ion: 1.13 mmol/L (ref 1.12–1.32)
Chloride: 106 mEq/L (ref 96–112)
HCT: 44 % (ref 36.0–46.0)
Hemoglobin: 15 g/dL (ref 12.0–15.0)
Potassium: 3.3 mEq/L — ABNORMAL LOW (ref 3.5–5.1)

## 2010-10-21 LAB — DIFFERENTIAL
Basophils Absolute: 0 10*3/uL (ref 0.0–0.1)
Basophils Relative: 0 % (ref 0–1)
Eosinophils Absolute: 0 10*3/uL (ref 0.0–0.7)
Neutro Abs: 5.8 10*3/uL (ref 1.7–7.7)
Neutrophils Relative %: 73 % (ref 43–77)

## 2010-10-21 LAB — GRAM STAIN

## 2010-10-21 LAB — ANAEROBIC CULTURE

## 2010-10-21 LAB — BODY FLUID CULTURE

## 2010-10-21 LAB — URINE MICROSCOPIC-ADD ON

## 2010-11-11 LAB — COMPREHENSIVE METABOLIC PANEL
ALT: 18 U/L (ref 0–35)
AST: 22 U/L (ref 0–37)
CO2: 32 mEq/L (ref 19–32)
Calcium: 9.2 mg/dL (ref 8.4–10.5)
GFR calc Af Amer: >60 mL/min (ref >60)
Sodium: 143 mEq/L (ref 135–145)
Total Protein: 7.7 g/dL (ref 6.0–8.3)

## 2010-11-11 LAB — ACETAMINOPHEN LEVEL: Acetaminophen (Tylenol), Serum: <10 ug/mL — ABNORMAL LOW (ref 10–30)

## 2010-11-11 LAB — DIFFERENTIAL
Eosinophils Absolute: 0 10*3/uL (ref 0.0–0.7)
Eosinophils Relative: 0 % (ref 0–5)
Lymphs Abs: 1 10*3/uL (ref 0.7–4.0)
Monocytes Absolute: 0.3 10*3/uL (ref 0.1–1.0)
Monocytes Relative: 3 % (ref 3–12)

## 2010-11-11 LAB — CBC
MCHC: 32.9 g/dL (ref 30.0–36.0)
RBC: 4.46 MIL/uL (ref 3.87–5.11)
RDW: 14.1 % (ref 11.5–15.5)

## 2010-12-15 NOTE — Assessment & Plan Note (Signed)
[facility]                         GASTROENTEROLOGY OFFICE NOTE   Amber Griffith, Amber Griffith                     MRN:          [id number]  DATE:06/06/2007                            DOB:          1951-05-31    PRIMARY CARE PHYSICIAN:  Barbette Hair. Artist Pais, DO   PREVIOUS GASTROENTEROLOGIST:  Dr. Victorino Dike   GI PROBLEM LIST:  1. Dysphagia. History of dysphagia and reflux symptoms. All very      responsive to Nexium once daily. EGD September 2007, by Dr. Victorino Dike, describes an esophageal stricture. Text describes      narrowing at the cricopharyngeus, but pictures shows what looks      like a Schatzki's ring above a small hiatal hernia.  2. Routine risk for colorectal cancer.   INTERVAL HISTORY:  Amber Griffith was last seen by Dr. Victorino Dike about a  year ago. Since then, she has been on Nexium once daily. She says she  takes this about an hour prior to eating an egg every morning and on  that regimen she has no acid symptoms and no dysphagia. She ran out of  it and has had a return of her same symptoms. We talked about some  lifestyle habits of hers that could contribute to GERD such as smoking a  half pack of cigarettes a day, drinking approximately 44 ounces of beer  a day and 2-3 cups of coffee a day. She has had no nausea and no  vomiting. No bleeding. Her weight has been stable.   CURRENT MEDICATIONS:  1. Spiriva.  2. Nexium.  3. Norvasc.   PHYSICAL EXAMINATION:  Weight 92 pounds, which is up two pounds since a  year and a half ago. Blood pressure 128/64, pulse 70.  CONSTITUTIONAL: Generally well-appearing.  ABDOMEN: Soft and nontender, nondistended. Normal bowel sounds.  EXTREMITIES: No lower extremity edema.   ASSESSMENT/PLAN:  A 60 year old woman with gastroesophageal reflux  disease and gastroesophageal reflux disease related dysphagia, well  responsive to PPI, routine risk for colorectal cancer.   Amber Griffith has no alarm symptoms and  she has responded well to PPI so  will call her in a prescription for Nexium. I have instructed her  however that it is best to take 20-30 minutes prior to a meal. Also,  gave her a gastroesophageal reflux disease handout to re-iterate some of  the lifestyle modifications that we discussed above. Lastly, talked  about colorectal cancer screening  and she agreed with undergoing a colonoscopy at her soonest convenience.  Dr. Doreatha Martin had mentioned this on one of his notes, but had not yet gotten  around to scheduling that.     Rachael Fee, MD  Electronically Signed    DPJ/MedQ  DD: 06/06/2007  DT: 06/06/2007  Job #: [id number]   cc:   Barbette Hair. Artist Pais, DO

## 2010-12-18 NOTE — Assessment & Plan Note (Signed)
[facility]                               PULMONARY OFFICE NOTE   Amber Griffith, Amber Griffith                     MRN:          [id number]  DATE:02/14/2006                            DOB:          [birth year]    I saw Amber Griffith in follow-up today for her possible COPD with asthma and  tobacco abuse.  She said she is down to smoking a half a pack of cigarettes  per day.  She did not try using the Chantix because she says she did not  want to pay the co-pay in spite of the fact that I explained to her that the  co-pay for this would actually be less expensive than continuing smoking.  She says that she would want to try and quit smoking on her own.  Feels that  she is making progress with this.  Currently, she is having symptoms of  intermittent cough with production of clear sputum.  She also says that she  gets the sensation that she needs to clear her throat, especially at night  and this is associated with occasional wheezing as well.  She is not really  using her albuterol inhaler.  She is currently on Spiriva and Advair 250/50.  Says she is not having much difficulty as far as dyspnea on exertion.  She  has not had any other significant changes in her health since her previous  visit.  She had undergone pulmonary function tests on January 29, 2006 and this  showed an FEV1 to FEV ratio of 83% with an FEV1 of 1.81 L and FVC of 2.81 L,  no significant bronchodilator response, mildly reduced total lung capacity  of 3.21 L which was 77% of predicted, normal diffusing capacity, and  truncation of her inferior limb of her flow volume loop.  She does say that  she had symptoms of snoring at night and has difficulty sleeping on her  back.  She also complains of feeling tired throughout the day as well as  having difficulty with memory and concentration.  She has not ever been  evaluated for having sleep apnea previously, though.   CURRENT MEDICATIONS:  1.   Omeprazole 20 mg b.i.d.  2.  Spiriva one puff daily.  3.  Advair 250/50 one puff b.i.d.  4.  Albuterol two puffs q.i.d. p.r.n.   PHYSICAL EXAMINATION:  VITAL SIGNS:  She is 93 pounds, temperature is 98.4,  blood pressure is 120/82, heart rate is 68, oxygen saturation is 98% on room  air.  HEENT:  There is no sinus tenderness.  Prominent nasal turbinates.  She has  an enlarged tongue.  Mallampati 4 airway and low-lying soft palate.  There  is no lymphadenopathy, no thyromegaly.  HEART:  S1, S2, regular rhythm.  CHEST:  Clear to auscultation.  ABDOMEN:  Soft, nontender.  Positive bowel sounds.  EXTREMITIES:  There was no edema.   IMPRESSION:  1.  Chronic bronchitis.  Her pulmonary function tests did not show evidence      for air flow obstruction at this time, although with her continued  tobacco abuse she is certainly at risk for developing this later on.  At      this time I will try to taper off her inhaler regimen.  I will have her      continue with her Spiriva and albuterol on a p.r.n. basis, but then      discontinue her Advair and then monitor her symptoms to see if she needs      to have this added back again.  In the meantime, I have again strongly      encouraged her about the importance of smoking cessation.  2.  Symptoms suggestive of sleep apnea with truncation of her inspiratory      loop on her flow volume loop.  I had reviewed the diagnosis of sleep      apnea with her.  To further evaluate this I will make arrangements for      her to undergo an overnight polysomnogram and I plan following up with      her in approximately six weeks to assess her symptom status after      discontinuing the Advair as well as to review her overnight      polysomnogram and to monitor her smoking cessation regimen.                                   Coralyn Helling, MD   VS/MedQ  DD:  02/14/2006  DT:  02/14/2006  Job #:  [id number]

## 2010-12-18 NOTE — Assessment & Plan Note (Signed)
[facility]                           GASTROENTEROLOGY OFFICE NOTE   Amber Griffith, Amber Griffith                     MRN:          [id number]  DATE:03/30/2006                            DOB:          Dec 19, 1950    This patient comes on March 30, 2006 and was referred by Dr. Artist Pais because of  some GERD and dysphagia.  It feels like a knot in the chest.  She has some  heartburn and says beer increases her abdominal pain.  She does have  episodes of gas and problems associated with constipation.  She says that  seems to bother her every several weeks.  She is 60 years of age.  She has  had some bleeding from her rectum as well.  She says she has had known  external hemorrhoids for 5 years.  She does have a history of an ulcer, but  she says she does not really remember how this was diagnosed.  She has had  no procedures.  She did have some labs from a month ago.  She has a negative  family history.   PAST MEDICAL HISTORY:  Reveals only some allergies and some breast surgery.   She works at [employer], does drink alcohol, sometimes 2 or 3 beers a day or maybe  more, but not every day, and this does increase her abdominal pain.   REVIEW OF SYSTEMS:  Basically noncontributory.   PHYSICAL EXAMINATION:  VITAL SIGNS:  She is only 4 feet 10 inches and weighs  92 pounds.  Blood pressure is 112/70, pulse 64 and regular.  GENERAL:  She is thin.  HEENT:  Her oropharynx is negative.  NECK:  Negative.  Thyroid was normal to palpation.  CHEST:  Clear.  HEART:  Regular rhythm.  ABDOMEN:  Somewhat distended and bloated.  It was nontender.  No masses or  organomegaly.  No bruits.  EXTREMITIES:  Her upper extremities are unremarkable and basically quite  thin.   IMPRESSION:  1. Gastroesophageal reflux disease with dysphagia.  2. Probably gastritis, maybe peptic ulcer disease.  3. History of increasing constipation and some rectal bleeding associated      with gas and  bloating.   RECOMMENDATION:  The patient is to be put on some Protonix daily.  I will  get her scheduled for an EGD.  I think she needs a colonoscopic examination  sometime in the near future, but I  want to wait to treat this after we have an opportunity to check her upper  gastrointestinal system.                                   Ulyess Mort, MD   SML/MedQ  DD:  03/30/2006  DT:  03/31/2006  Job #:  [id number]   cc:   Barbette Hair. Artist Pais, DO

## 2010-12-24 ENCOUNTER — Encounter: Payer: Self-pay | Admitting: Internal Medicine

## 2010-12-24 ENCOUNTER — Ambulatory Visit (INDEPENDENT_AMBULATORY_CARE_PROVIDER_SITE_OTHER): Payer: BC Managed Care – PPO | Admitting: Internal Medicine

## 2010-12-24 VITALS — BP 132/68 | HR 80 | Temp 98.5°F | Ht <= 58 in | Wt 87.2 lb

## 2010-12-24 DIAGNOSIS — F419 Anxiety disorder, unspecified: Secondary | ICD-10-CM

## 2010-12-24 DIAGNOSIS — F32A Depression, unspecified: Secondary | ICD-10-CM

## 2010-12-24 DIAGNOSIS — J019 Acute sinusitis, unspecified: Secondary | ICD-10-CM

## 2010-12-24 DIAGNOSIS — J441 Chronic obstructive pulmonary disease with (acute) exacerbation: Secondary | ICD-10-CM | POA: Insufficient documentation

## 2010-12-24 DIAGNOSIS — Z0000 Encounter for general adult medical examination without abnormal findings: Secondary | ICD-10-CM | POA: Insufficient documentation

## 2010-12-24 DIAGNOSIS — F329 Major depressive disorder, single episode, unspecified: Secondary | ICD-10-CM

## 2010-12-24 DIAGNOSIS — F341 Dysthymic disorder: Secondary | ICD-10-CM

## 2010-12-24 DIAGNOSIS — G4700 Insomnia, unspecified: Secondary | ICD-10-CM

## 2010-12-24 HISTORY — DX: Depression, unspecified: F32.A

## 2010-12-24 HISTORY — DX: Anxiety disorder, unspecified: F41.9

## 2010-12-24 MED ORDER — ALPRAZOLAM 0.25 MG PO TABS: 0.2500 mg | ORAL_TABLET | Freq: Three times a day (TID) | ORAL | Status: AC | PRN

## 2010-12-24 MED ORDER — ESCITALOPRAM OXALATE 10 MG PO TABS: 10.0000 mg | ORAL_TABLET | Freq: Every day | ORAL | Status: DC

## 2010-12-24 MED ORDER — ZOLPIDEM TARTRATE 5 MG PO TABS: 5.0000 mg | ORAL_TABLET | Freq: Every evening | ORAL | Status: DC | PRN

## 2010-12-24 MED ORDER — CEPHALEXIN 500 MG PO CAPS: 500.0000 mg | ORAL_CAPSULE | Freq: Four times a day (QID) | ORAL | Status: AC

## 2010-12-24 MED ORDER — PREDNISONE 10 MG PO TABS: 10.0000 mg | ORAL_TABLET | Freq: Every day | ORAL | Status: AC

## 2010-12-24 NOTE — Assessment & Plan Note (Signed)
Mild situational, for limited ambien prn,  to f/u any worsening symptoms or concerns

## 2010-12-24 NOTE — Assessment & Plan Note (Addendum)
Mild to mod , for antibx course,  to f/u any worsening symptoms or concerns, for work note as well

## 2010-12-24 NOTE — Progress Notes (Signed)
  Subjective:    Patient ID: Amber Griffith, female    DOB: 08/23/1950, 60 y.o.   MRN: [id number]  HPI   Here with 3 days acute onset fever, facial pain, pressure, general weakness and malaise, and greenish d/c, with slight ST, but little to no cough and Pt denies chest pain, increased sob or doe, wheezing, orthopnea, PND, increased LE swelling, palpitations, dizziness or syncope.  Also c/o worsening HA's and anxiety/stress recently due to being "harrassed" at work, co-workers tell her she is "imagining things."  She plans to see therapist for anger, has been seen for this in the past, first appt is tomorrow. 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  Has insomnia worse in the past 2 wks as well. Not wheezing recently except ?mild at night, not using the inhaler though.  She may be in jeopardy of losing her job   Past Medical History  Diagnosis Date  . ALLERGIC RHINITIS 09/01/2007  . Cardiomegaly 01/17/2009  . CONSTIPATION, CHRONIC 12/18/2008  . COPD 04/04/2007  . ELECTROCARDIOGRAM, ABNORMAL 01/17/2009  . GERD 04/04/2007  . HEADACHE, TENSION 04/18/2008  . HEMORRHOIDS 09/25/2007  . HYPERTENSION 06/12/2007  . SINUSITIS- ACUTE-NOS 05/08/2010  . TOBACCO ABUSE 04/04/2007  . UTI 09/30/2010  . Vaginitis and vulvovaginitis, unspecified 09/30/2010   Past Surgical History  Procedure Date  . Tonsillectomy   . S/p breast bx   . Tubal ligation   . S/p umbilical hernia     reports that she has been smoking.  She does not have any smokeless tobacco history on file. She reports that she drinks alcohol. Her drug history not on file. family history includes Asthma in her mother; COPD in her maternal uncle; Hypertension in her other; Seizures in her other; and Stroke in her other. No Known Allergies No  current outpatient prescriptions on file prior to visit.   Review of Systems All otherwise neg per pt     Objective:   Physical Exam Physical Exam  VS noted, mild ill appearing Bilat tm's severe erythema.  Sinus tender bilat.  Pharynx mild erythema Constitutional: Pt appears well-developed and well-nourished.  HENT: Head: Normocephalic.  Right Ear: External ear normal. Left Ear: External ear normal.  Eyes: Conjunctivae and EOM are normal. Pupils are equal, round, and reactive to light.  Neck: Normal range of motion. Neck supple.  Cardiovascular: Normal rate and regular rhythm.   Pulmonary/Chest: Effort normal and breath sounds decreased bilat with very mild wheezes Abd:  Soft, NT, non-distended, + BS Neurological: Pt is alert. No cranial nerve deficit.  Skin: Skin is warm. No erythema.  Psychiatric: Pt behavior is normal. Thought content normal. but 3+ nervous, tremulous, tearful, depressed affect        Assessment & Plan:

## 2010-12-24 NOTE — Patient Instructions (Signed)
Take all new medications as prescribed Continue all other medications as before Please keep your appt tomorrow for counseling You are given the work note  Please return in 1 mo to Dr Yetta Barre

## 2010-12-24 NOTE — Assessment & Plan Note (Signed)
Very mild, for some reason does not want an inhlaer refill even though she is out, but will tx with predpack for home

## 2010-12-24 NOTE — Assessment & Plan Note (Addendum)
?   Situational;  For lexapro 10 qd, and xanax prn for now (to minimize in the future or d/c),  to f/u any worsening symptoms or concerns, to f/u also with counseling tomorrow as she has planned

## 2010-12-29 ENCOUNTER — Ambulatory Visit (INDEPENDENT_AMBULATORY_CARE_PROVIDER_SITE_OTHER): Payer: BC Managed Care – PPO | Admitting: Internal Medicine

## 2010-12-29 ENCOUNTER — Encounter: Payer: Self-pay | Admitting: Internal Medicine

## 2010-12-29 VITALS — BP 120/72 | HR 79 | Temp 98.7°F | Ht <= 58 in | Wt 88.0 lb

## 2010-12-29 DIAGNOSIS — F341 Dysthymic disorder: Secondary | ICD-10-CM

## 2010-12-29 DIAGNOSIS — F329 Major depressive disorder, single episode, unspecified: Secondary | ICD-10-CM

## 2010-12-29 DIAGNOSIS — J441 Chronic obstructive pulmonary disease with (acute) exacerbation: Secondary | ICD-10-CM

## 2010-12-29 DIAGNOSIS — J019 Acute sinusitis, unspecified: Secondary | ICD-10-CM

## 2010-12-29 DIAGNOSIS — F32A Depression, unspecified: Secondary | ICD-10-CM

## 2010-12-29 NOTE — Assessment & Plan Note (Signed)
Resolved, to finish meds as prescribed; urged pt to quit smoking completely but may not be able at this time due to stressors

## 2010-12-29 NOTE — Assessment & Plan Note (Addendum)
Has been referred to counseling,  Has first appt soon with Judithe Modest, some improved with the xanax, and tolerating the lexapro well;  Work note extension is approp for this as well

## 2010-12-29 NOTE — Assessment & Plan Note (Signed)
Improving, but mild persistent overall symptoms, and marked fatigue;  Ok for work note extension as per pt request

## 2010-12-29 NOTE — Progress Notes (Signed)
Subjective:    Patient ID: Annitta Needs, female    DOB: [birth year], 60 y.o.   MRN: [id number]  HPI here to f/u;  Overall some improved sinusitis symtpoms from last thurs visit, but still with significant fatigue, low grade temp and discomfort,  Good compliance with meds;  Wheezing has resolved.  Pt denies chest pain, increased sob or doe, wheezing, orthopnea, PND, increased LE swelling, palpitations, dizziness or syncope. Pt denies new neurological symptoms such as new headache, or facial or extremity weakness or numbness  Pt denies polydipsia, polyuria,  Pt states overall good compliance with meds, including the xanax which has helped to a great extent, and has started the lexapro to which she has tolerated well so far.  Denies worsening depressive symptoms, suicidal ideation, or panic.  Has some sinus congestion and loose stool over the weekend but improved today.  Denies worsening reflux, dysphagia, abd pain, n/v, bowel change or blood.   Past Medical History  Diagnosis Date  . ALLERGIC RHINITIS 09/01/2007  . Cardiomegaly 01/17/2009  . CONSTIPATION, CHRONIC 12/18/2008  . COPD 04/04/2007  . ELECTROCARDIOGRAM, ABNORMAL 01/17/2009  . GERD 04/04/2007  . HEADACHE, TENSION 04/18/2008  . HEMORRHOIDS 09/25/2007  . HYPERTENSION 06/12/2007  . SINUSITIS- ACUTE-NOS 05/08/2010  . TOBACCO ABUSE 04/04/2007  . UTI 09/30/2010  . Vaginitis and vulvovaginitis, unspecified 09/30/2010  . Anxiety and depression 12/24/2010   Past Surgical History  Procedure Date  . Tonsillectomy   . S/p breast bx   . Tubal ligation   . S/p umbilical hernia     reports that she has been smoking.  She does not have any smokeless tobacco history on file. She reports that she drinks alcohol. Her drug history not on file. family history includes Asthma in her mother; COPD in her maternal uncle; Hypertension in her other; Seizures in her other; and Stroke in her other. No Known Allergies Current Outpatient Prescriptions on File Prior to  Visit  Medication Sig Dispense Refill  . albuterol (PROAIR HFA) 108 (90 BASE) MCG/ACT inhaler Inhale 2 puffs into the lungs every 6 (six) hours as needed.        . ALPRAZolam (XANAX) 0.25 MG tablet Take 1 tablet (0.25 mg total) by mouth 3 (three) times daily as needed for anxiety.  40 tablet  0  . amLODipine (NORVASC) 10 MG tablet Take 10 mg by mouth daily.        Marland Kitchen aspirin 81 MG tablet Take 81 mg by mouth daily.        . cephALEXin (KEFLEX) 500 MG capsule Take 1 capsule (500 mg total) by mouth 4 (four) times daily.  40 capsule  0  . escitalopram (LEXAPRO) 10 MG tablet Take 1 tablet (10 mg total) by mouth daily.  30 tablet  11  . esomeprazole (NEXIUM) 40 MG packet Take 40 mg by mouth daily. For heartburn       . polyethylene glycol (MIRALAX / GLYCOLAX) packet Once daily in 8 ounces of juice       . predniSONE (DELTASONE) 10 MG tablet Take 1 tablet (10 mg total) by mouth daily. 3 tabs by mouth per day for 3 days, then 2 tabs per day for 3 days, then 1 tab per day for 3 days, then stop   18 tablet  0  . zolpidem (AMBIEN) 5 MG tablet Take 1 tablet (5 mg total) by mouth at bedtime as needed for sleep.  30 tablet  1   Review of Systems All otherwise neg  per pt     Objective:   Physical Exam BP 120/72  Pulse 79  Temp(Src) 98.7 F (37.1 C) (Oral)  Ht 4\' 10"  (1.473 m)  Wt 88 lb (39.917 kg)  BMI 18.39 kg/m2  SpO2 96% Physical Exam  VS noted, less ill appearing Constitutional: Pt appears well-developed and well-nourished.  HENT: Head: Normocephalic.  Right Ear: External ear normal.  Left Ear: External ear normal.  Bilat tm's mild erythema.  Sinus nontender.  Pharynx mild erythema Eyes: Conjunctivae and EOM are normal. Pupils are equal, round, and reactive to light.  Neck: Normal range of motion. Neck supple.  Cardiovascular: Normal rate and regular rhythm.   Pulmonary/Chest: Effort normal and breath sounds normal.  Abd:  Soft, NT, non-distended, + BS Neurological: Pt is alert. No cranial  nerve deficit.  Skin: Skin is warm. No erythema.  Psychiatric: Pt behavior is normal. Thought content normal. 1-2+ nervous, depressed affect        Assessment & Plan:

## 2010-12-29 NOTE — Patient Instructions (Addendum)
Continue all other medications as before - finish all medications from last Thursday's visit You are given the work note extension as well Please keep your appointments with your specialists as you have planned - the counseling Please see Dr Yetta Barre June 21 as planned

## 2011-01-01 ENCOUNTER — Other Ambulatory Visit: Payer: Self-pay | Admitting: Internal Medicine

## 2011-01-01 ENCOUNTER — Ambulatory Visit (INDEPENDENT_AMBULATORY_CARE_PROVIDER_SITE_OTHER): Payer: BC Managed Care – PPO | Admitting: Licensed Clinical Social Worker

## 2011-01-01 DIAGNOSIS — F331 Major depressive disorder, recurrent, moderate: Secondary | ICD-10-CM

## 2011-01-01 DIAGNOSIS — F411 Generalized anxiety disorder: Secondary | ICD-10-CM

## 2011-01-06 DIAGNOSIS — Z0279 Encounter for issue of other medical certificate: Secondary | ICD-10-CM

## 2011-01-15 ENCOUNTER — Ambulatory Visit (INDEPENDENT_AMBULATORY_CARE_PROVIDER_SITE_OTHER): Payer: BC Managed Care – PPO | Admitting: Licensed Clinical Social Worker

## 2011-01-15 DIAGNOSIS — F331 Major depressive disorder, recurrent, moderate: Secondary | ICD-10-CM

## 2011-01-15 DIAGNOSIS — Z0279 Encounter for issue of other medical certificate: Secondary | ICD-10-CM

## 2011-01-15 DIAGNOSIS — F411 Generalized anxiety disorder: Secondary | ICD-10-CM

## 2011-01-21 ENCOUNTER — Ambulatory Visit: Payer: BC Managed Care – PPO | Admitting: Internal Medicine

## 2011-02-01 ENCOUNTER — Ambulatory Visit (INDEPENDENT_AMBULATORY_CARE_PROVIDER_SITE_OTHER): Payer: BC Managed Care – PPO | Admitting: Internal Medicine

## 2011-02-01 ENCOUNTER — Encounter: Payer: Self-pay | Admitting: Internal Medicine

## 2011-02-01 DIAGNOSIS — F17200 Nicotine dependence, unspecified, uncomplicated: Secondary | ICD-10-CM

## 2011-02-01 DIAGNOSIS — F329 Major depressive disorder, single episode, unspecified: Secondary | ICD-10-CM

## 2011-02-01 DIAGNOSIS — J449 Chronic obstructive pulmonary disease, unspecified: Secondary | ICD-10-CM

## 2011-02-01 DIAGNOSIS — Z23 Encounter for immunization: Secondary | ICD-10-CM

## 2011-02-01 DIAGNOSIS — I10 Essential (primary) hypertension: Secondary | ICD-10-CM

## 2011-02-01 DIAGNOSIS — F341 Dysthymic disorder: Secondary | ICD-10-CM

## 2011-02-01 MED ORDER — AMLODIPINE BESYLATE 10 MG PO TABS: 10.0000 mg | ORAL_TABLET | Freq: Every day | ORAL | Status: DC

## 2011-02-01 MED ORDER — ALBUTEROL SULFATE HFA 108 (90 BASE) MCG/ACT IN AERS: 2.0000 | INHALATION_SPRAY | Freq: Four times a day (QID) | RESPIRATORY_TRACT | Status: DC | PRN

## 2011-02-01 NOTE — Assessment & Plan Note (Signed)
Her BP is well controlled 

## 2011-02-01 NOTE — Assessment & Plan Note (Signed)
She is doing well on her current meds 

## 2011-02-01 NOTE — Assessment & Plan Note (Signed)
She is doing well on albuterol inhaler 

## 2011-02-01 NOTE — Progress Notes (Signed)
  Subjective:    Patient ID: Amber Griffith, female    DOB: 1951-06-18, 60 y.o.   MRN: [id number]  Hypertension This is a chronic problem. The current episode started more than 1 year ago. The problem has been gradually improving since onset. The problem is controlled. Pertinent negatives include no anxiety, blurred vision, chest pain, headaches, malaise/fatigue, neck pain, orthopnea, palpitations, peripheral edema, PND, shortness of breath or sweats. Past treatments include calcium channel blockers. The current treatment provides significant improvement. There are no compliance problems.       Review of Systems  Constitutional: Negative for fever, chills, malaise/fatigue, diaphoresis, activity change, appetite change, fatigue and unexpected weight change.  HENT: Negative for ear pain, nosebleeds, congestion, sore throat, rhinorrhea, trouble swallowing, neck pain, voice change, postnasal drip and sinus pressure.   Eyes: Negative for blurred vision, photophobia and visual disturbance.  Respiratory: Negative for apnea, cough, choking, chest tightness, shortness of breath, wheezing and stridor.   Cardiovascular: Negative for chest pain, palpitations, orthopnea, leg swelling and PND.  Gastrointestinal: Negative for nausea, vomiting, abdominal pain, diarrhea, constipation, blood in stool, abdominal distention and anal bleeding.  Genitourinary: Negative for dysuria, urgency, frequency, hematuria, flank pain, decreased urine volume, enuresis, difficulty urinating and dyspareunia.  Musculoskeletal: Negative for myalgias, back pain, joint swelling, arthralgias and gait problem.  Skin: Negative for color change, pallor, rash and wound.  Neurological: Negative for dizziness, tremors, seizures, syncope, speech difficulty, weakness, light-headedness, numbness and headaches.  Hematological: Negative for adenopathy. Does not bruise/bleed easily.  Psychiatric/Behavioral: Negative.        Objective:   Physical Exam  Vitals reviewed. Constitutional: She is oriented to person, place, and time. She appears well-developed and well-nourished. No distress.  HENT:  Head: Normocephalic and atraumatic.  Right Ear: External ear normal.  Left Ear: External ear normal.  Nose: Nose normal.  Mouth/Throat: Oropharynx is clear and moist. No oropharyngeal exudate.  Eyes: Conjunctivae and EOM are normal. Pupils are equal, round, and reactive to light. Right eye exhibits no discharge. Left eye exhibits no discharge. No scleral icterus.  Neck: Normal range of motion. Neck supple. No JVD present. No tracheal deviation present. No thyromegaly present.  Cardiovascular: Normal rate, regular rhythm, normal heart sounds and intact distal pulses.  Exam reveals no gallop and no friction rub.   No murmur heard. Pulmonary/Chest: Effort normal and breath sounds normal. No stridor. No respiratory distress. She has no wheezes. She has no rales. She exhibits no tenderness.  Abdominal: Soft. Bowel sounds are normal. She exhibits no distension and no mass. There is no tenderness. There is no rebound and no guarding.  Musculoskeletal: Normal range of motion. She exhibits no edema and no tenderness.  Lymphadenopathy:    She has no cervical adenopathy.  Neurological: She is alert and oriented to person, place, and time. She has normal reflexes. She displays normal reflexes. No cranial nerve deficit. She exhibits normal muscle tone. Coordination normal.  Skin: Skin is warm and dry. No rash noted. She is not diaphoretic. No erythema. No pallor.  Psychiatric: She has a normal mood and affect. Her behavior is normal. Judgment and thought content normal.          Assessment & Plan:

## 2011-02-01 NOTE — Assessment & Plan Note (Signed)
She was asked to quit smoking

## 2011-02-01 NOTE — Patient Instructions (Signed)
Hypertension (High Blood Pressure) As your heart beats, it forces blood through your arteries. This force is your blood pressure. If the pressure is too high, it is called hypertension (HTN) or high blood pressure. HTN is dangerous because you may have it and not know it. High blood pressure may mean that your heart has to work harder to pump blood. Your arteries may be narrow or stiff. The extra work puts you at risk for heart disease, stroke, and other problems.  Blood pressure consists of two numbers, a higher number over a lower, 110/72, for example. It is stated as "110 over 72." The ideal is below 120 for the top number (systolic) and under 80 for the bottom (diastolic). Write down your blood pressure today. You should pay close attention to your blood pressure if you have certain conditions such as:  Heart failure.  Prior heart attack.   Diabetes   Chronic kidney disease.   Prior stroke.   Multiple risk factors for heart disease.   To see if you have HTN, your blood pressure should be measured while you are seated with your arm held at the level of the heart. It should be measured at least twice. A one-time elevated blood pressure reading (especially in the Emergency Department) does not mean that you need treatment. There may be conditions in which the blood pressure is different between your right and left arms. It is important to see your caregiver soon for a recheck. Most people have essential hypertension which means that there is not a specific cause. This type of high blood pressure may be lowered by changing lifestyle factors such as:  Stress.  Smoking.   Lack of exercise.   Excessive weight.  Drug/tobacco/alcohol use.   Eating less salt.   Most people do not have symptoms from high blood pressure until it has caused damage to the body. Effective treatment can often prevent, delay or reduce that damage. TREATMENT Treatment for high blood pressure, when a cause has been  identified, is directed at the cause. There are a large number of medications to treat HTN. These fall into several categories, and your caregiver will help you select the medicines that are best for you. Medications may have side effects. You should review side effects with your caregiver. If your blood pressure stays high after you have made lifestyle changes or started on medicines,   Your medication(s) may need to be changed.   Other problems may need to be addressed.   Be certain you understand your prescriptions, and know how and when to take your medicine.   Be sure to follow up with your caregiver within the time frame advised (usually within two weeks) to have your blood pressure rechecked and to review your medications.   If you are taking more than one medicine to lower your blood pressure, make sure you know how and at what times they should be taken. Taking two medicines at the same time can result in blood pressure that is too low.  SEEK IMMEDIATE MEDICAL CARE IF YOU DEVELOP:  A severe headache, blurred or changing vision, or confusion.   Unusual weakness or numbness, or a faint feeling.   Severe chest or abdominal pain, vomiting, or breathing problems.  MAKE SURE YOU:   Understand these instructions.   Will watch your condition.   Will get help right away if you are not doing well or get worse.  Document Released: 07/19/2005 Document Re-Released: 01/06/2010 Exit[employer] Patient Information 2011 [employer].Smoking Cessation This document explains the best ways for you to quit smoking and new treatments to help. It lists new medicines that can double or triple your chances of quitting and quitting for good. It also considers ways to avoid relapses and concerns you may have about quitting, including weight gain. NICOTINE: A POWERFUL ADDICTION If you have tried to quit smoking, you know how hard it can be. It is hard because nicotine is a very addictive drug. For some  people, it can be as addictive as heroin or cocaine. Usually, people make 2 or 3 tries, or more, before finally being able to quit. Each time you try to quit, you can learn about what helps and what hurts. Quitting takes hard work and a lot of effort, but you can quit smoking. QUITTING SMOKING IS ONE OF THE MOST IMPORTANT THINGS YOU WILL EVER DO:  You will live longer, feel better, and live better.   The impact on your body of quitting smoking is felt almost immediately:   Within 20 minutes, blood pressure decreases. Pulse returns to its normal level.   After 8 hours, carbon monoxide levels in the blood return to normal. Oxygen level increases.   After 24 hours, chance of heart attack starts to decrease. Breath, hair, and body stop smelling like smoke.   After 48 hours, damaged nerve endings begin to recover. Sense of taste and smell improve.   After 72 hours, the body is virtually free of nicotine. Bronchial tubes relax and breathing becomes easier.   After 2 to 12 weeks, lungs can hold more air. Exercise becomes easier and circulation improves.   Quitting will lower your chance of having a heart attack, stroke, cancer, or lung disease:   After 1 year, the risk of coronary heart disease is cut in half.   After 5 years, the risk of stroke falls to the same as a nonsmoker.   After 10 years, the risk of lung cancer is cut in half and the risk of other cancers decreases significantly.   After 15 years, the risk of coronary heart disease drops, usually to the level of a nonsmoker.   If you are pregnant, quitting smoking will improve your chances of having a healthy baby.   The people you live with, especially your children, will be healthier.   You will have extra money to spend on things other than cigarettes.  FIVE KEYS TO QUITTING Studies have shown that these 5 steps will help you quit smoking and quit for good. You have the best chances of quitting if you use them  together: 1. Get ready.  2. Get support and encouragement.  3. Learn new skills and behaviors.  4. Get medicine to reduce your nicotine addiction and use it correctly.  5. Be prepared for relapse or difficult situations. Be determined to continue trying to quit, even if you do not succeed at first.  1. GET READY  Set a quit date.   Change your environment.   Get rid of ALL cigarettes, ashtrays, matches, and lighters in your home, car, and place of work.   Do not let people smoke in your home.   Review your past attempts to quit. Think about what worked and what did not.   Once you quit, do not smoke. NOT EVEN A PUFF!  2. GET SUPPORT AND ENCOURAGEMENT Studies have shown that you have a better chance of being successful if you have help. You can get support in many ways.    Tell your family, friends, and coworkers that you are going to quit and need their support. Ask them not to smoke around you.   Talk to your caregivers (doctor, dentist, nurse, pharmacist, psychologist, and/or smoking counselor).   Get individual, group, or telephone counseling and support. The more counseling you have, the better your chances are of quitting. Programs are available at local hospitals and health centers. Call your local health department for information about programs in your area.   Spiritual beliefs and practices may help some smokers quit.   Quit meters are small computer programs online or downloadable that keep track of quit statistics, such as amount of "quit-time," cigarettes not smoked, and money saved.   Many smokers find one or more of the many self-help books available useful in helping them quit and stay off tobacco.  3. LEARN NEW SKILLS AND BEHAVIORS  Try to distract yourself from urges to smoke. Talk to someone, go for a walk, or occupy your time with a task.   When you first try to quit, change your routine. Take a different route to work. Drink tea instead of coffee. Eat breakfast  in a different place.   Do something to reduce your stress. Take a hot bath, exercise, or read a book.   Plan something enjoyable to do every day. Reward yourself for not smoking.   Explore interactive web-based programs that specialize in helping you quit.  4. GET MEDICINE AND USE IT CORRECTLY Medicines can help you stop smoking and decrease the urge to smoke. Combining medicine with the above behavioral methods and support can quadruple your chances of successfully quitting smoking. The [place] Food and Drug Administration (FDA) has approved 7 medicines to help you quit smoking. These medicines fall into 3 categories.  Nicotine replacement therapy (delivers nicotine to your body without the negative effects and risks of smoking):   Nicotine gum: Available over-the-counter.   Nicotine lozenges: Available over-the-counter.   Nicotine inhaler: Available by prescription.   Nicotine nasal spray: Available by prescription.   Nicotine skin patches (transdermal): Available by prescription and over-the-counter.   Antidepressant medicine (helps people abstain from smoking, but how this works is unknown):   Bupropion sustained-release (SR) tablets: Available by prescription.   Nicotinic receptor partial agonist (simulates the effect of nicotine in your brain):   Varenicline tartrate tablets: Available by prescription.   Ask your caregiver for advice about which medicines to use and how to use them. Carefully read the information on the package.   Everyone who is trying to quit may benefit from using a medicine. If you are pregnant or trying to become pregnant, nursing an infant, you are under age 18, or you smoke fewer than 10 cigarettes per day, talk to your caregiver before taking any nicotine replacement medicines.   You should stop using a nicotine replacement product and call your caregiver if you experience nausea, dizziness, weakness, vomiting, fast or irregular heartbeat, mouth  problems with the lozenge or gum, or redness or swelling of the skin around the patch that does not go away.   Do not use any other product containing nicotine while using a nicotine replacement product.   Talk to your caregiver before using these products if you have diabetes, heart disease, asthma, stomach ulcers, you had a recent heart attack, you have high blood pressure that is not controlled with medicine, a history of irregular heartbeat, or you have been prescribed medicine to help you quit smoking.  5. BE PREPARED FOR RELAPSE   OR DIFFICULT SITUATIONS  Most relapses occur within the first 3 months after quitting. Do not be discouraged if you start smoking again. Remember, most people try several times before they finally quit.   You may have symptoms of withdrawal because your body is used to nicotine. You may crave cigarettes, be irritable, feel very hungry, cough often, get headaches, or have difficulty concentrating.   The withdrawal symptoms are only temporary. They are strongest when you first quit, but they will go away within 10 to 14 days.  Here are some difficult situations to watch for:  Alcohol. Avoid drinking alcohol. Drinking lowers your chances of successfully quitting.   Caffeine. Try to reduce the amount of caffeine you consume. It also lowers your chances of successfully quitting.   Other smokers. Being around smoking can make you want to smoke. Avoid smokers.   Weight gain. Many smokers will gain weight when they quit, usually less than 10 pounds. Eat a healthy diet and stay active. Do not let weight gain distract you from your main goal, quitting smoking. Some medicines that help you quit smoking may also help delay weight gain. You can always lose the weight gained after you quit.   Bad mood or depression. There are a lot of ways to improve your mood other than smoking.  If you are having problems with any of these situations, talk to your caregiver. SPECIAL  SITUATIONS OR CONDITIONS Studies suggest that everyone can quit smoking. Your situation or condition can give you a special reason to quit.  Pregnant women/New mothers: By quitting, you protect your baby's health and your own.   Hospitalized patients: By quitting, you reduce health problems and help healing.   Heart attack patients: By quitting, you reduce your risk of a second heart attack.   Lung, head, and neck cancer patients: By quitting, you reduce your chance of a second cancer.   Parents of children and adolescents: By quitting, you protect your children from illnesses caused by secondhand smoke.  QUESTIONS TO THINK ABOUT Think about the following questions before you try to stop smoking. You may want to talk about your answers with your caregiver.  Why do you want to quit?   If you tried to quit in the past, what helped and what did not?   What will be the most difficult situations for you after you quit? How will you plan to handle them?   Who can help you through the tough times? Your family? Friends? Caregiver?   What pleasures do you get from smoking? What ways can you still get pleasure if you quit?  Here are some questions to ask your caregiver:  How can you help me to be successful at quitting?   What medicine do you think would be best for me and how should I take it?   What should I do if I need more help?   What is smoking withdrawal like? How can I get information on withdrawal?  Quitting takes hard work and a lot of effort, but you can quit smoking. FOR MORE INFORMATION Smokefree.gov (http://www.smokefree.gov) provides free, accurate, evidence-based information and professional assistance to help support the immediate and long-term needs of people trying to quit smoking. Document Released: 07/13/2001 Document Re-Released: 01/06/2010 Exit[employer] Patient Information 2011 [employer]. 

## 2011-04-06 ENCOUNTER — Telehealth: Payer: Self-pay

## 2011-04-06 NOTE — Telephone Encounter (Signed)
[facility] Triage Call Report Triage Record Num: [id number] Operator: Tana Felts Patient Name: Katey Barrie Call Date & Time: 04/03/2011 9:54:57AM Patient Phone: [phone] PCP: Sanda Linger Patient Gender: Female PCP Fax : Patient DOB: 10/13/1950 Practice Name: [facility] Reason for Call: Onset 04/02/11 Denies Fever. [place] calling regarding sore throat, headache, congestion 'feels stuffy', sweating. All emergent sxs r/o per Upper Respiratory Infection Protocol. Home care advice given. Acetaminophen dose per label. Pt verbalized understanding. Protocol(s) Used: Upper Respiratory Infection (URI) Recommended Outcome per Protocol: Provide Home/Self Care Reason for Outcome: New onset of two or more of the following symptoms: nasal congestion with runny nose; sneezing; itchy or mild sore throat; mild headache or body aches; mild fatigue; low grade fever up to 101.5 F (38.6C) usually lasting about a week Care Advice: ~ Use a cool mist humidifier to moisten air. Be sure to clean according to manufacturer's instructions. ~ Call provider if symptoms worsen or new symptoms develop. ~ Consider use of a saline nasal spray per package directions to help relieve nasal congestion. Mild symptoms of a cold can be expected to last 7 to 10 days. Sometimes, a cough associated with a cold can last up to 3 weeks. Over-the-counter cold medications may temporarily relieve the symptoms, but do not shorten the length of the cold. ~ A warm, moist compress placed on face, over eyes for 15 to 20 minutes, 5 to 6 times a day, may help relieve the congestion. ~ Most adults need to drink 6-10 eight-ounce glasses (1.2-2.0 liters) of fluids per day unless previously told to limit fluid intake for other medical reasons. Limit fluids that contain caffeine, sugar or alcohol. Urine will be a very light yellow color when you drink enough fluids. ~ Analgesic/Antipyretic Advice - Acetaminophen: Consider  acetaminophen as directed on label or by pharmacist/provider for pain or fever PRECAUTIONS: - Use if there is no history of liver disease, alcoholism, or intake of three or more alcohol drinks per day - Only if approved by provider during pregnancy or when breastfeeding - During pregnancy, acetaminophen should not be taken more than 3 consecutive days without telling provider - Do not exceed recommended dose or frequency ~ Sore Throat Relief: - Use warm salt water gargles 3 to 4 times/day, as needed (1/2 tsp. salt in 8 oz. [.2 liters] water). - Suck on hard candy, nonprescription or herbal throat lozenges (sugar-free if diabetic) - Eat soothing, soft food/fluids (broths, soups, or honey and lemon juice in hot tea, Popsicles, frozen yogurt or sherbet, scrambled eggs, cooked cereals, Jell-O or puddings) whichever is most comforting. - Avoid eating salty, spicy or acidic foods. ~ ~ Rest until symptoms improve. If more than [redacted] weeks pregnant, lie on left side when resting. Be aware that some nonprescription drugs contain both an antihistamine and decongestant. If taking a combination drug, do not take an additional decongestant. ~ Respiratory Hygiene: - Cover the nose/mouth tightly with a tissue when coughing or sneezing. - Use tissue 1 time and discard in the nearest waste receptacle. - Wash hands with soap and water or alcohol-based hand rub after coming into contact with respiratory secretions and ~ 04/03/2011 10:09:48AM Page 1 of 2 [id number] [facility] Triage Call Report Patient Name: Calista Crain continuation page/s contaminated objects/materials. - Alternatively when no tissue is available, cough into the bend of the elbow. - .Avoid touching your eyes, nose or mouth. 09/

## 2011-04-09 ENCOUNTER — Ambulatory Visit (INDEPENDENT_AMBULATORY_CARE_PROVIDER_SITE_OTHER): Payer: BC Managed Care – PPO | Admitting: Internal Medicine

## 2011-04-09 ENCOUNTER — Encounter: Payer: Self-pay | Admitting: Internal Medicine

## 2011-04-09 ENCOUNTER — Ambulatory Visit (INDEPENDENT_AMBULATORY_CARE_PROVIDER_SITE_OTHER)
Admission: RE | Admit: 2011-04-09 | Discharge: 2011-04-09 | Disposition: A | Discharge status: Home / Self Care | Payer: BC Managed Care – PPO | Source: Ambulatory Visit | Attending: Internal Medicine | Admitting: Internal Medicine

## 2011-04-09 VITALS — BP 102/60 | HR 66 | Temp 98.1°F | Ht <= 58 in | Wt 88.2 lb

## 2011-04-09 DIAGNOSIS — J449 Chronic obstructive pulmonary disease, unspecified: Secondary | ICD-10-CM

## 2011-04-09 DIAGNOSIS — J4489 Other specified chronic obstructive pulmonary disease: Secondary | ICD-10-CM

## 2011-04-09 DIAGNOSIS — I10 Essential (primary) hypertension: Secondary | ICD-10-CM

## 2011-04-09 DIAGNOSIS — R079 Chest pain, unspecified: Secondary | ICD-10-CM

## 2011-04-09 DIAGNOSIS — J209 Acute bronchitis, unspecified: Secondary | ICD-10-CM

## 2011-04-09 DIAGNOSIS — R911 Solitary pulmonary nodule: Secondary | ICD-10-CM

## 2011-04-09 DIAGNOSIS — F17200 Nicotine dependence, unspecified, uncomplicated: Secondary | ICD-10-CM

## 2011-04-09 MED ORDER — LEVOFLOXACIN 500 MG PO TABS: 500.0000 mg | ORAL_TABLET | Freq: Every day | ORAL | Status: AC

## 2011-04-09 MED ORDER — BENZONATATE 100 MG PO CAPS: 100.0000 mg | ORAL_CAPSULE | Freq: Three times a day (TID) | ORAL | Status: DC | PRN

## 2011-04-09 NOTE — Progress Notes (Signed)
Subjective:    Patient ID: Amber Griffith, female    DOB: 1951-04-18, 60 y.o.   MRN: [id number]  HPI  Here with acute onset mild to mod 2-3 days ST, HA, general weakness and malaise, with prod cough greenish sputum, but Pt denies chest pain, increased sob or doe, wheezing, orthopnea, PND, increased LE swelling, palpitations, dizziness or syncope, except for mild to mod, pleuritic onset sharp chest discomfort to the right lateral mid chest area;  Pt denies new neurological symptoms such as new headache, or facial or extremity weakness or numbness   Pt denies polydipsia, polyuria.  Pt states overall good compliance with meds. Still smoking, not planning to quit for now.  No other new complaints.  Denies worsening depressive symptoms, suicidal ideation, or panic, though has ongoing anxiety.  Denies worsening reflux, dysphagia, abd pain, n/v, bowel change or blood. Past Medical History  Diagnosis Date  . ALLERGIC RHINITIS 09/01/2007  . Cardiomegaly 01/17/2009  . CONSTIPATION, CHRONIC 12/18/2008  . COPD 04/04/2007  . ELECTROCARDIOGRAM, ABNORMAL 01/17/2009  . GERD 04/04/2007  . HEADACHE, TENSION 04/18/2008  . HEMORRHOIDS 09/25/2007  . HYPERTENSION 06/12/2007  . SINUSITIS- ACUTE-NOS 05/08/2010  . TOBACCO ABUSE 04/04/2007  . UTI 09/30/2010  . Vaginitis and vulvovaginitis, unspecified 09/30/2010  . Anxiety and depression 12/24/2010   Past Surgical History  Procedure Date  . Tonsillectomy   . S/p breast bx   . Tubal ligation   . S/p umbilical hernia     reports that she has been smoking.  She does not have any smokeless tobacco history on file. She reports that she does not drink alcohol. Her drug history not on file. family history includes Asthma in her mother; COPD in her maternal uncle; Hypertension in her other; Seizures in her other; and Stroke in her other. No Known Allergies Current Outpatient Prescriptions on File Prior to Visit  Medication Sig Dispense Refill  . albuterol (PROAIR HFA) 108 (90  BASE) MCG/ACT inhaler Inhale 2 puffs into the lungs every 6 (six) hours as needed.  1 Inhaler  11  . ALPRAZolam (XANAX) 0.25 MG tablet Take 1 tablet (0.25 mg total) by mouth 3 (three) times daily as needed for anxiety.  40 tablet  0  . amLODipine (NORVASC) 10 MG tablet Take 1 tablet (10 mg total) by mouth daily.  90 tablet  3  . aspirin 81 MG tablet Take 81 mg by mouth daily.        Marland Kitchen escitalopram (LEXAPRO) 10 MG tablet Take 1 tablet (10 mg total) by mouth daily.  30 tablet  11  . esomeprazole (NEXIUM) 40 MG packet Take 40 mg by mouth daily. For heartburn       . polyethylene glycol (MIRALAX / GLYCOLAX) packet Once daily in 8 ounces of juice       . zolpidem (AMBIEN) 5 MG tablet Take 1 tablet (5 mg total) by mouth at bedtime as needed for sleep.  30 tablet  1   Review of Systems Review of Systems  Constitutional: Negative for diaphoresis and unexpected weight change.  HENT: Negative for drooling and tinnitus.   Eyes: Negative for photophobia and visual disturbance.  Respiratory: Negative for choking and stridor.   Gastrointestinal: Negative for vomiting and blood in stool.  Genitourinary: Negative for hematuria and decreased urine volume.     Objective:   Physical Exam BP 102/60  Pulse 66  Temp(Src) 98.1 F (36.7 C) (Oral)  Ht 4\' 10"  (1.473 m)  Wt 88 lb 4 oz (  40.03 kg)  BMI 18.44 kg/m2  SpO2 96% Physical Exam  VS noted, mild ill Constitutional: Pt appears well-developed and well-nourished.  HENT: Head: Normocephalic.  Right Ear: External ear normal.  Left Ear: External ear normal.  Bilat tm's mild erythema.  Sinus nontender.  Pharynx mild erythema Eyes: Conjunctivae and EOM are normal. Pupils are equal, round, and reactive to light.  Neck: Normal range of motion. Neck supple.  Cardiovascular: Normal rate and regular rhythm.   Pulmonary/Chest: Effort normal and breath sounds decreased bilat, no wheezing Neurological: Pt is alert. No cranial nerve deficit.  Skin: Skin is warm. No  erythema.  Psychiatric: Pt behavior is normal. Thought content normal. 1+ nervous        Assessment & Plan:

## 2011-04-09 NOTE — Patient Instructions (Addendum)
Take all new medications as prescribed - the antibiotic and cough pills Continue all other medications as before Please go to XRAY in the [facility] for the x-ray test Please call the phone number [phone] (the [employer] System) for results of testing in 2-3 days;  When calling, simply dial the number, and when prompted enter the MRN number above (the Medical Record Number) and the # key, then the message should start. Please stop smoking

## 2011-04-09 NOTE — Assessment & Plan Note (Addendum)
Right lateral chest, somewhat pleuritic/atypical - for cxr, r/o pna

## 2011-04-10 ENCOUNTER — Encounter: Payer: Self-pay | Admitting: Internal Medicine

## 2011-04-10 NOTE — Assessment & Plan Note (Signed)
BP Readings from Last 3 Encounters:  04/09/11 102/60  02/01/11 130/68  12/29/10 120/72   stable overall by hx and exam, most recent data reviewed with pt, and pt to continue medical treatment as before

## 2011-04-10 NOTE — Assessment & Plan Note (Signed)
Urged to quit 

## 2011-04-10 NOTE — Assessment & Plan Note (Signed)
stable overall by hx and exam, most recent data reviewed with pt, and pt to continue medical treatment as before  SpO2 Readings from Last 3 Encounters:  04/09/11 96%  02/01/11 97%  12/29/10 96%

## 2011-04-10 NOTE — Assessment & Plan Note (Signed)
Mild to mod, for antibx course,  to f/u any worsening symptoms or concerns 

## 2011-04-14 ENCOUNTER — Ambulatory Visit (INDEPENDENT_AMBULATORY_CARE_PROVIDER_SITE_OTHER)
Admission: RE | Admit: 2011-04-14 | Discharge: 2011-04-14 | Disposition: A | Discharge status: Home / Self Care | Payer: BC Managed Care – PPO | Source: Ambulatory Visit | Attending: Internal Medicine | Admitting: Internal Medicine

## 2011-04-14 ENCOUNTER — Other Ambulatory Visit: Payer: Self-pay | Admitting: Internal Medicine

## 2011-04-14 DIAGNOSIS — R911 Solitary pulmonary nodule: Secondary | ICD-10-CM

## 2011-04-15 ENCOUNTER — Telehealth: Payer: Self-pay

## 2011-04-15 NOTE — Telephone Encounter (Signed)
Patient requesting work note due to arm and shoulder soreness from test yesterday. 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 Would like to return to work on Monday September 17. Please advise

## 2011-04-15 NOTE — Telephone Encounter (Signed)
Ok for note 

## 2011-04-15 NOTE — Telephone Encounter (Signed)
Called patient left message work note requested is completed and ready for pickup at front desk.

## 2011-05-17 ENCOUNTER — Ambulatory Visit (INDEPENDENT_AMBULATORY_CARE_PROVIDER_SITE_OTHER): Payer: BC Managed Care – PPO | Admitting: Internal Medicine

## 2011-05-17 ENCOUNTER — Encounter: Payer: Self-pay | Admitting: Internal Medicine

## 2011-05-17 VITALS — BP 120/70 | HR 71 | Temp 98.5°F | Resp 16 | Wt 88.2 lb

## 2011-05-17 DIAGNOSIS — T63461A Toxic effect of venom of wasps, accidental (unintentional), initial encounter: Secondary | ICD-10-CM

## 2011-05-17 DIAGNOSIS — J019 Acute sinusitis, unspecified: Secondary | ICD-10-CM

## 2011-05-17 DIAGNOSIS — T6391XA Toxic effect of contact with unspecified venomous animal, accidental (unintentional), initial encounter: Secondary | ICD-10-CM

## 2011-05-17 DIAGNOSIS — T63441A Toxic effect of venom of bees, accidental (unintentional), initial encounter: Secondary | ICD-10-CM

## 2011-05-17 MED ORDER — CEFUROXIME AXETIL 500 MG PO TABS: 500.0000 mg | ORAL_TABLET | Freq: Two times a day (BID) | ORAL | Status: AC

## 2011-05-17 MED ORDER — METHYLPREDNISOLONE ACETATE 80 MG/ML IJ SUSP
120.0000 mg | Freq: Once | INTRAMUSCULAR | Status: AC
  Administered 2011-05-17: 120 mg via INTRAMUSCULAR

## 2011-05-17 NOTE — Assessment & Plan Note (Signed)
Start ceftin 

## 2011-05-17 NOTE — Patient Instructions (Signed)
Bee, Wasp, or Hornet Sting Your caregiver has diagnosed you as having an insect sting. An insect sting appears as a red lump in the skin that sometimes has a tiny hole in the center, or it may have a stinger in the center of the wound. The most common stings are from wasps, hornets and bees. Individuals have different reactions to insect stings.  A normal reaction may cause pain, swelling, and redness around the sting site.   A localized allergic reaction may cause swelling and redness that extends beyond the sting site.   A large local reaction may continue to develop over the next 12 to 36 hours.   On occasion, the reactions can be severe (anaphylactic reaction). An anaphylactic reaction may cause wheezing; difficulty breathing; chest pain; fainting; raised, itchy, red patches on the skin; a sick feeling to your stomach (nausea); vomiting; cramping; or diarrhea. If you have had an anaphylactic reaction to an insect sting in the past, you are more likely to have one again.  HOME CARE INSTRUCTIONS  With bee stings, a small sac of poison is left in the wound. Brushing across this with something such as a credit card, or anything similar, will help remove this and decrease the amount of the reaction. This same procedure will not help a wasp sting as they do not leave behind a stinger and poison sac.   Apply a cold compress for 10 to 20 minutes every hour for 1 to 2 days, depending on severity, to reduce swelling and itching.   To lessen pain, a paste made of water and baking soda, may be rubbed on the bite or sting and left on for 5 minutes.   To relieve itching and swelling, you may use take medication or apply medicated creams or lotions as directed.   Only take over-the-counter or prescription medicines for pain, discomfort, or fever as directed by your caregiver.   Wash the sting site daily with soap and water. Apply antibiotic ointment on the sting site as directed.   If you suffered a  severe reaction:   If you did not require hospitalization, an adult will need to stay with you for 24 hours in case the symptoms return.   You may need to wear a medical bracelet or necklace stating the allergy.   You and your family need to learn when and how to use an anaphylaxis kit or epinephrine injection.   If you have had a severe reaction before, always carry your anaphylaxis kit with you.  SEEK MEDICAL CARE IF:  None of the above helps within 2 to 3 days.   The area becomes red, warm, tender, and swollen beyond the area of the bite or sting.   You have an oral temperature above 100.5.  SEEK IMMEDIATE MEDICAL CARE IF: You have symptoms of an allergic reaction which are:  Wheezing.  Difficulty breathing.   Chest pain.   Lightheadedness or fainting.  Itchy, raised, red patches on the skin.   Nausea, vomiting, cramping or diarrhea.   ANY OF THESE SYMPTOMS MAY REPRESENT A SERIOUS PROBLEM THAT IS AN EMERGENCY. Do not wait to see if the symptoms will go away. Get medical help right away. Call  Sinusitis Sinuses are air pockets within the bones of your face. The growth of bacteria within a sinus leads to infection. Infection keeps the sinuses from draining. This infection is called sinusitis. SYMPTOMS There will be different areas of pain depending on which sinuses have become infected.  The maxillary sinuses often produce pain beneath the eyes.   Frontal sinusitis may cause pain in the middle of the forehead and above the eyes.  Other problems (symptoms) include:  Toothaches.   Colored, pus-like (purulent) drainage from the nose.   Any swelling, warmth, or tenderness over the sinus areas may be signs of infection.  TREATMENT Sinusitis is most often determined by an exam and you may have x-rays taken. If x-rays have been taken, make sure you obtain your results. Or find out how you are to obtain them. Your caregiver may give you medications (antibiotics). These are  medications that will help kill the infection. You may also be given a medication (decongestant) that helps to reduce sinus swelling.  HOME CARE INSTRUCTIONS  Only take over-the-counter or prescription medicines for pain, discomfort, or fever as directed by your caregiver.   Drink extra fluids. Fluids help thin the mucus so your sinuses can drain more easily.   Applying either moist heat or ice packs to the sinus areas may help relieve discomfort.   Use saline nasal sprays to help moisten your sinuses. The sprays can be found at your local drugstore.  SEEK IMMEDIATE MEDICAL CARE IF YOU DEVELOP:  High fever that is still present after two days of antibiotic treatment.   Increasing pain, severe headaches, or toothache.   Nausea, vomiting, or drowsiness.   Unusual swelling around the face or trouble seeing.  MAKE SURE YOU:   Understand these instructions.   Will watch your condition.   Will get help right away if you are not doing well or get worse.  Document Released: 07/19/2005 Document Re-Released: 07/01/2008 [employer] Patient Information 2011 [place], [place]. (911 in [place] DO NOT drive yourself to the hospital. MAKE SURE YOU:  Understand these instructions.   Will watch your condition.   Will get help right away if you are not doing well or get worse.  Document Released: 07/19/2005 Document Re-Released: 01/06/2010 [facility] Patient Information 2011 [place], [place].

## 2011-05-17 NOTE — Progress Notes (Signed)
Subjective:    Patient ID: Amber Griffith, female    DOB: 1950-08-21, 60 y.o.   MRN: [id number]  URI  This is a new problem. The current episode started 1 to 4 weeks ago. The problem has been gradually worsening. There has been no fever. Associated symptoms include congestion, coughing, rhinorrhea, sinus pain and a sore throat. Pertinent negatives include no abdominal pain, chest pain, diarrhea, dysuria, ear pain, headaches, joint pain, joint swelling, nausea, neck pain, plugged ear sensation, rash, sneezing, swollen glands, vomiting or wheezing. She has tried nothing for the symptoms.  Allergic Reaction This is a new problem. The current episode started 2 days ago. The problem occurs constantly. The problem is unchanged. The problem is mild. The patient was exposed to insect bit. The time of exposure was separated in time from the onset of symptoms. The exposure occurred at home. Associated symptoms include coughing. Pertinent negatives include no abdominal pain, chest pain, chest pressure, diarrhea, difficulty breathing, drooling, eye itching, eye redness, eye watering, globus sensation, hyperventilation, itching, rash, stridor, trouble swallowing, vomiting or wheezing. Swelling location: upper lip. Past treatments include nothing.      Review of Systems  Constitutional: Negative for fever, chills, diaphoresis, activity change, appetite change, fatigue and unexpected weight change.  HENT: Positive for congestion, sore throat, rhinorrhea, postnasal drip and sinus pressure. Negative for hearing loss, ear pain, nosebleeds, facial swelling, sneezing, drooling, mouth sores, trouble swallowing, neck pain, neck stiffness, dental problem, voice change, tinnitus and ear discharge.   Eyes: Negative for photophobia, pain, discharge, redness, itching and visual disturbance.  Respiratory: Positive for cough. Negative for apnea, choking, chest tightness, wheezing and stridor.   Cardiovascular: Negative for  chest pain and leg swelling.  Gastrointestinal: Negative for nausea, vomiting, abdominal pain, diarrhea, constipation, blood in stool, abdominal distention and anal bleeding.  Genitourinary: Negative.  Negative for dysuria, urgency, frequency, hematuria, flank pain, decreased urine volume, enuresis, difficulty urinating and dyspareunia.  Musculoskeletal: Negative.  Negative for joint pain.  Skin: Negative for color change, itching, pallor, rash and wound.  Neurological: Negative for dizziness, tremors, seizures, syncope, facial asymmetry, speech difficulty, weakness, light-headedness, numbness and headaches.  Hematological: Negative for adenopathy. Does not bruise/bleed easily.  Psychiatric/Behavioral: Negative.        Objective:   Physical Exam  Vitals reviewed. Constitutional: She is oriented to person, place, and time. She appears well-developed and well-nourished. No distress.  HENT:  Head: Normocephalic and atraumatic. No trismus in the jaw.  Right Ear: Hearing, tympanic membrane, external ear and ear canal normal.  Left Ear: Hearing, tympanic membrane, external ear and ear canal normal.  Nose: Mucosal edema and rhinorrhea present. No nose lacerations, sinus tenderness, nasal deformity, septal deviation or nasal septal hematoma. No epistaxis.  No foreign bodies. Right sinus exhibits maxillary sinus tenderness. Right sinus exhibits no frontal sinus tenderness. Left sinus exhibits maxillary sinus tenderness. Left sinus exhibits no frontal sinus tenderness.  Mouth/Throat: Oropharynx is clear and moist. Mucous membranes are not pale, not dry and not cyanotic. No uvula swelling. No oropharyngeal exudate, posterior oropharyngeal edema, posterior oropharyngeal erythema or tonsillar abscesses.    Eyes: Conjunctivae and EOM are normal. Pupils are equal, round, and reactive to light. Right eye exhibits no discharge. Left eye exhibits no discharge. No scleral icterus.  Neck: Normal range of motion.  Neck supple. No JVD present. No tracheal deviation present. No thyromegaly present.  Cardiovascular: Normal rate, regular rhythm, normal heart sounds and intact distal pulses.  Exam reveals no gallop and  no friction rub.   No murmur heard. Pulmonary/Chest: Effort normal and breath sounds normal. No stridor. No respiratory distress. She has no wheezes. She has no rales. She exhibits no tenderness.  Abdominal: Soft. Bowel sounds are normal. She exhibits no distension and no mass. There is no tenderness. There is no rebound and no guarding.  Musculoskeletal: Normal range of motion. She exhibits no edema and no tenderness.  Lymphadenopathy:    She has no cervical adenopathy.  Neurological: She is oriented to person, place, and time. She displays normal reflexes. No cranial nerve deficit. She exhibits normal muscle tone. Coordination normal.  Skin: Skin is warm and dry. No rash noted. She is not diaphoretic. No erythema. No pallor.  Psychiatric: She has a normal mood and affect. Her behavior is normal. Judgment and thought content normal.          Assessment & Plan:

## 2011-05-17 NOTE — Assessment & Plan Note (Signed)
Depo/medrol IM was given to treat the allergic reaction

## 2011-06-02 ENCOUNTER — Encounter: Payer: Self-pay | Admitting: Internal Medicine

## 2011-06-02 ENCOUNTER — Ambulatory Visit (INDEPENDENT_AMBULATORY_CARE_PROVIDER_SITE_OTHER): Payer: BC Managed Care – PPO | Admitting: Internal Medicine

## 2011-06-02 VITALS — BP 120/64 | HR 62 | Temp 97.1°F | Resp 16

## 2011-06-02 DIAGNOSIS — R51 Headache: Secondary | ICD-10-CM

## 2011-06-02 DIAGNOSIS — R519 Headache, unspecified: Secondary | ICD-10-CM | POA: Insufficient documentation

## 2011-06-02 MED ORDER — NAPROXEN SODIUM ER 375 MG PO TB24: 1.0000 | ORAL_TABLET | Freq: Every day | ORAL | Status: DC | PRN

## 2011-06-02 NOTE — Patient Instructions (Signed)

## 2011-06-02 NOTE — Progress Notes (Signed)
Subjective:    Patient ID: Amber Griffith, female    DOB: [birth year], 60 y.o.   MRN: [id number]  Headache  This is a recurrent problem. The current episode started 1 to 4 weeks ago. The problem occurs daily. The problem has been gradually worsening. The pain is located in the bilateral region. The pain does not radiate. The pain quality is not similar to prior headaches. The quality of the pain is described as squeezing. The pain is at a severity of 4/10. The pain is mild. Pertinent negatives include no abdominal pain, abnormal behavior, anorexia, back pain, blurred vision, coughing, dizziness, drainage, ear pain, eye pain, eye redness, eye watering, facial sweating, fever, hearing loss, insomnia, loss of balance, muscle aches, nausea, neck pain, numbness, phonophobia, photophobia, rhinorrhea, scalp tenderness, seizures, sinus pressure, sore throat, swollen glands, tingling, tinnitus, visual change, vomiting, weakness or weight loss. The symptoms are aggravated by nothing. She has tried nothing for the symptoms. Her past medical history is significant for sinus disease.      Review of Systems  Constitutional: Negative for fever, chills, weight loss, diaphoresis, activity change, appetite change, fatigue and unexpected weight change.  HENT: Negative for hearing loss, ear pain, sore throat, facial swelling, rhinorrhea, trouble swallowing, neck pain, neck stiffness, voice change, sinus pressure, tinnitus and ear discharge.   Eyes: Negative for blurred vision, photophobia, pain, discharge, redness, itching and visual disturbance.  Respiratory: Negative for apnea, cough, choking, chest tightness, shortness of breath, wheezing and stridor.   Cardiovascular: Negative for chest pain, palpitations and leg swelling.  Gastrointestinal: Negative for nausea, vomiting, abdominal pain, diarrhea, constipation, anal bleeding and anorexia.  Genitourinary: Negative for dysuria, urgency, frequency, hematuria, flank  pain, decreased urine volume, enuresis, difficulty urinating and dyspareunia.  Musculoskeletal: Negative for myalgias, back pain, joint swelling, arthralgias and gait problem.  Skin: Negative for color change, pallor, rash and wound.  Neurological: Positive for headaches. Negative for dizziness, tingling, tremors, seizures, syncope, facial asymmetry, weakness, light-headedness, numbness and loss of balance.  Hematological: Negative for adenopathy. Does not bruise/bleed easily.  Psychiatric/Behavioral: Negative.  The patient does not have insomnia.        Objective:   Physical Exam  Vitals reviewed. Constitutional: She is oriented to person, place, and time. She appears well-developed and well-nourished. No distress.  HENT:  Head: Normocephalic and atraumatic.  Mouth/Throat: Oropharynx is clear and moist. No oropharyngeal exudate.  Eyes: Conjunctivae and EOM are normal. Pupils are equal, round, and reactive to light. Right eye exhibits no discharge. Left eye exhibits no discharge. No scleral icterus.  Neck: Normal range of motion. Neck supple. No JVD present. No tracheal deviation present. No thyromegaly present.  Cardiovascular: Normal rate, regular rhythm, normal heart sounds and intact distal pulses.  Exam reveals no gallop and no friction rub.   No murmur heard. Pulmonary/Chest: Effort normal and breath sounds normal. No stridor. No respiratory distress. She has no wheezes. She has no rales. She exhibits no tenderness.  Abdominal: Soft. Bowel sounds are normal. She exhibits no distension. There is no tenderness. There is no rebound and no guarding.  Musculoskeletal: Normal range of motion. She exhibits no edema and no tenderness.  Lymphadenopathy:    She has no cervical adenopathy.  Neurological: She is alert and oriented to person, place, and time. She has normal strength. She displays no atrophy, no tremor and normal reflexes. No cranial nerve deficit or sensory deficit. She exhibits  normal muscle tone. She displays a negative Romberg sign. She displays no  seizure activity. Coordination and gait normal. She displays no Babinski's sign on the right side. She displays no Babinski's sign on the left side.  Reflex Scores:      Tricep reflexes are 1+ on the right side and 1+ on the left side.      Bicep reflexes are 1+ on the right side and 1+ on the left side.      Brachioradialis reflexes are 1+ on the right side and 1+ on the left side.      Patellar reflexes are 1+ on the right side and 1+ on the left side.      Achilles reflexes are 1+ on the right side and 1+ on the left side. Skin: Skin is warm and dry. No rash noted. She is not diaphoretic. No erythema. No pallor.  Psychiatric: She has a normal mood and affect. Her behavior is normal. Judgment and thought content normal.          Assessment & Plan:

## 2011-06-02 NOTE — Assessment & Plan Note (Signed)
This is a new and different HA pain for her, her description is not such that I can diagnosis a certain type of HA, I am concerned about a CNS lesion and with her history of sinus disease I have asked her to have a CT done, she will try an nsaid for pain, she tells me that all of the s/s of sinus infection have resolved

## 2011-06-04 ENCOUNTER — Telehealth: Payer: Self-pay

## 2011-06-04 NOTE — Telephone Encounter (Signed)
Patient needing to know if ok for her to receive flu vaccine from employer. Spoke with patient and advised yes ok.

## 2011-06-07 ENCOUNTER — Ambulatory Visit (INDEPENDENT_AMBULATORY_CARE_PROVIDER_SITE_OTHER)
Admission: RE | Admit: 2011-06-07 | Discharge: 2011-06-07 | Disposition: A | Discharge status: Home / Self Care | Payer: BC Managed Care – PPO | Source: Ambulatory Visit | Attending: Internal Medicine | Admitting: Internal Medicine

## 2011-06-07 DIAGNOSIS — R51 Headache: Secondary | ICD-10-CM

## 2011-06-08 ENCOUNTER — Telehealth: Payer: Self-pay | Admitting: *Deleted

## 2011-06-08 NOTE — Telephone Encounter (Signed)
Requesting results from Ct scan done on 06/07/11.

## 2011-06-09 ENCOUNTER — Telehealth: Payer: Self-pay

## 2011-06-09 NOTE — Telephone Encounter (Signed)
Left message on machine for pt to return my call  

## 2011-06-09 NOTE — Telephone Encounter (Signed)
Pt.notified

## 2011-06-09 NOTE — Telephone Encounter (Signed)
Yes, that is possible

## 2011-06-09 NOTE — Telephone Encounter (Signed)
Patient informed. 

## 2011-06-09 NOTE — Telephone Encounter (Signed)
CT scan is normal.

## 2011-06-09 NOTE — Telephone Encounter (Signed)
Patient called requesting results of CT scan. I advised pt that scan resulted back normal. Per pt, she moved out of a house that was mold infected and would like to know if mold could be a possible source for her headaches. Please advise thanks

## 2011-06-30 ENCOUNTER — Ambulatory Visit: Payer: BC Managed Care – PPO | Admitting: Internal Medicine

## 2011-09-07 ENCOUNTER — Other Ambulatory Visit: Payer: Self-pay | Admitting: Internal Medicine

## 2011-09-07 DIAGNOSIS — Z1231 Encounter for screening mammogram for malignant neoplasm of breast: Secondary | ICD-10-CM

## 2011-10-05 ENCOUNTER — Ambulatory Visit (HOSPITAL_COMMUNITY)
Admission: RE | Admit: 2011-10-05 | Discharge: 2011-10-05 | Disposition: A | Discharge status: Home / Self Care | Payer: BC Managed Care – PPO | Source: Ambulatory Visit | Attending: Internal Medicine | Admitting: Internal Medicine

## 2011-10-05 DIAGNOSIS — Z1231 Encounter for screening mammogram for malignant neoplasm of breast: Secondary | ICD-10-CM | POA: Insufficient documentation

## 2012-02-03 ENCOUNTER — Other Ambulatory Visit: Payer: Self-pay | Admitting: Internal Medicine

## 2012-03-01 ENCOUNTER — Ambulatory Visit (INDEPENDENT_AMBULATORY_CARE_PROVIDER_SITE_OTHER)
Admission: RE | Admit: 2012-03-01 | Discharge: 2012-03-01 | Disposition: A | Discharge status: Home / Self Care | Payer: BC Managed Care – PPO | Source: Ambulatory Visit | Attending: Internal Medicine | Admitting: Internal Medicine

## 2012-03-01 ENCOUNTER — Other Ambulatory Visit (INDEPENDENT_AMBULATORY_CARE_PROVIDER_SITE_OTHER): Payer: BC Managed Care – PPO

## 2012-03-01 ENCOUNTER — Ambulatory Visit (INDEPENDENT_AMBULATORY_CARE_PROVIDER_SITE_OTHER): Payer: BC Managed Care – PPO | Admitting: Internal Medicine

## 2012-03-01 ENCOUNTER — Encounter: Payer: Self-pay | Admitting: Internal Medicine

## 2012-03-01 VITALS — BP 102/60 | HR 66 | Temp 98.1°F | Resp 16 | Wt 85.5 lb

## 2012-03-01 DIAGNOSIS — R109 Unspecified abdominal pain: Secondary | ICD-10-CM

## 2012-03-01 DIAGNOSIS — R911 Solitary pulmonary nodule: Secondary | ICD-10-CM

## 2012-03-01 DIAGNOSIS — E7800 Pure hypercholesterolemia, unspecified: Secondary | ICD-10-CM

## 2012-03-01 DIAGNOSIS — I10 Essential (primary) hypertension: Secondary | ICD-10-CM

## 2012-03-01 DIAGNOSIS — K649 Unspecified hemorrhoids: Secondary | ICD-10-CM

## 2012-03-01 LAB — COMPREHENSIVE METABOLIC PANEL
ALT: 18 U/L (ref 0–35)
Albumin: 4.6 g/dL (ref 3.5–5.2)
CO2: 30 mEq/L (ref 19–32)
Calcium: 9.3 mg/dL (ref 8.4–10.5)
Chloride: 105 mEq/L (ref 96–112)
GFR: 136.58 mL/min (ref >60.00)
Glucose, Bld: 104 mg/dL — ABNORMAL HIGH (ref 70–99)
Potassium: 4 mEq/L (ref 3.5–5.1)
Sodium: 142 mEq/L (ref 135–145)
Total Bilirubin: 0.6 mg/dL (ref 0.3–1.2)
Total Protein: 7.5 g/dL (ref 6.0–8.3)

## 2012-03-01 LAB — URINALYSIS, ROUTINE W REFLEX MICROSCOPIC
Bilirubin Urine: NEGATIVE
Nitrite: NEGATIVE
Specific Gravity, Urine: 1.02 (ref 1.000–1.030)
Total Protein, Urine: NEGATIVE
pH: 6.5 (ref 5.0–8.0)

## 2012-03-01 LAB — LIPID PANEL
HDL: 80.3 mg/dL (ref >39.00)
Total CHOL/HDL Ratio: 2

## 2012-03-01 LAB — CBC WITH DIFFERENTIAL/PLATELET
Basophils Relative: 0.7 % (ref 0.0–3.0)
Eosinophils Absolute: 0.1 10*3/uL (ref 0.0–0.7)
MCHC: 32.4 g/dL (ref 30.0–36.0)
MCV: 92.1 fl (ref 78.0–100.0)
Monocytes Absolute: 0.6 10*3/uL (ref 0.1–1.0)
Neutro Abs: 2.5 10*3/uL (ref 1.4–7.7)
WBC: 6 10*3/uL (ref 4.5–10.5)

## 2012-03-01 NOTE — Assessment & Plan Note (Signed)
F/up CXR today

## 2012-03-01 NOTE — Progress Notes (Signed)
Subjective:    Patient ID: Amber Griffith, female    DOB: 01-Sep-1950, 61 y.o.   MRN: [id number]  Back Pain This is a new problem. The current episode started in the past 7 days. The problem occurs intermittently. The problem is unchanged. The pain is present in the sacro-iliac (left lateral iliac crest and left flank). The quality of the pain is described as aching. The pain does not radiate. The pain is at a severity of 2/10. The pain is mild. The symptoms are aggravated by bending. Pertinent negatives include no abdominal pain, bladder incontinence, bowel incontinence, chest pain, dysuria, fever, headaches, leg pain, numbness, paresis, paresthesias, pelvic pain, perianal numbness, tingling, weakness or weight loss. She has tried NSAIDs for the symptoms. The treatment provided moderate relief.      Review of Systems  Constitutional: Negative for fever, chills, weight loss, diaphoresis, activity change, appetite change, fatigue and unexpected weight change.  HENT: Negative.   Eyes: Negative.   Respiratory: Negative for cough, chest tightness, shortness of breath, wheezing and stridor.   Cardiovascular: Negative for chest pain, palpitations and leg swelling.  Gastrointestinal: Positive for blood in stool. Negative for nausea, vomiting, abdominal pain, diarrhea, constipation, abdominal distention, anal bleeding, rectal pain and bowel incontinence.  Genitourinary: Positive for flank pain. Negative for bladder incontinence, dysuria, urgency, frequency, hematuria, decreased urine volume, vaginal bleeding, vaginal discharge, enuresis, difficulty urinating, genital sores, vaginal pain, menstrual problem, pelvic pain and dyspareunia.  Musculoskeletal: Positive for back pain. Negative for myalgias, joint swelling, arthralgias and gait problem.  Skin: Negative for color change, pallor, rash and wound.  Neurological: Negative.  Negative for tingling, weakness, numbness, headaches and paresthesias.    Hematological: Negative for adenopathy. Does not bruise/bleed easily.  Psychiatric/Behavioral: Negative.        Objective:   Physical Exam  Vitals reviewed. Constitutional: She is oriented to person, place, and time. She appears well-developed and well-nourished.  Non-toxic appearance. She does not have a sickly appearance. She does not appear ill. No distress.  HENT:  Head: Normocephalic and atraumatic.  Mouth/Throat: Oropharynx is clear and moist. No oropharyngeal exudate.  Eyes: Conjunctivae are normal. Right eye exhibits no discharge. Left eye exhibits no discharge. No scleral icterus.  Neck: Normal range of motion. Neck supple. No JVD present. No tracheal deviation present. No thyromegaly present.  Cardiovascular: Normal rate, regular rhythm, normal heart sounds and intact distal pulses.  Exam reveals no gallop and no friction rub.   No murmur heard. Pulmonary/Chest: Effort normal and breath sounds normal. No stridor. No respiratory distress. She has no wheezes. She has no rales. She exhibits no tenderness.  Abdominal: Soft. Normal appearance and bowel sounds are normal. She exhibits no shifting dullness, no distension, no pulsatile liver, no fluid wave, no abdominal bruit, no ascites, no pulsatile midline mass and no mass. There is no hepatosplenomegaly. There is no tenderness. There is no rebound, no guarding and no CVA tenderness. No hernia. Hernia confirmed negative in the ventral area.  Genitourinary: Rectal exam shows external hemorrhoid and internal hemorrhoid. Rectal exam shows no fissure, no mass, no tenderness and anal tone normal. Guaiac negative stool.  Musculoskeletal: Normal range of motion. She exhibits no edema and no tenderness.       Lumbar back: Normal. She exhibits normal range of motion, no tenderness, no bony tenderness, no swelling, no edema, no deformity, no laceration, no pain, no spasm and normal pulse.  Lymphadenopathy:    She has no cervical adenopathy.   Neurological: She  is oriented to person, place, and time.  Skin: Skin is warm and dry. No rash noted. She is not diaphoretic. No erythema. No pallor.  Psychiatric: She has a normal mood and affect. Her behavior is normal. Judgment and thought content normal.      Lab Results  Component Value Date   WBC 6.7 09/23/2009   HGB 11.9* 09/23/2009   HCT 35.7* 09/23/2009   PLT 210 09/23/2009   GLUCOSE 95 09/23/2009   CHOL 145 01/17/2009   TRIG 58.0 01/17/2009   HDL 64.60 01/17/2009   LDLCALC 69 01/17/2009   ALT 18 01/17/2009   AST 21 01/17/2009   NA 137 09/23/2009   K 3.3* 09/23/2009   CL 103 09/23/2009   CREATININE 0.54 09/23/2009   BUN 7 09/23/2009   CO2 26 09/23/2009   TSH 0.63 01/17/2009      Assessment & Plan:

## 2012-03-01 NOTE — Assessment & Plan Note (Signed)
FLP CMP TSH today 

## 2012-03-01 NOTE — Assessment & Plan Note (Signed)
I will check a plain film to look for stones and will check her labs as well to look for hematuria, infection, renal failure, anemia ,etc

## 2012-03-01 NOTE — Assessment & Plan Note (Signed)
She has some bleeding so I have offered her a referral to GI

## 2012-03-01 NOTE — Patient Instructions (Signed)
Flank Pain Flank pain refers to pain that is located on the side of the body between the upper abdomen and the back. It can be caused by many things. CAUSES  Some of the more common causes of flank pain include:  Muscle strain.   Muscle spasms.   A disease of your spine (vertebral disk disease).   A lung infection (pneumonia).   Fluid around your lungs (pulmonary edema).   A kidney infection.   Kidney stones.   A very painful skin rash on only one side of your body (shingles).   Gallbladder disease.  DIAGNOSIS  Blood tests, urine tests, and X-rays may help your caregiver determine what is wrong. TREATMENT  The treatment of pain depends on the cause. Your caregiver will determine what treatment will work best for you. HOME CARE INSTRUCTIONS   Home care will depend on the cause of your pain.   Some medications may help relieve the pain. Take medication for relief of pain as directed by your caregiver.   Tell your caregiver about any changes in your pain.   Follow up with your caregiver.  SEEK IMMEDIATE MEDICAL CARE IF:   Your pain is not controlled with medication.   The pain increases.   You have abdominal pain.   You have shortness of breath.   You have persistent nausea or vomiting.   You have swelling in your abdomen.   You feel faint or pass out.   You have a temperature by mouth above 102 F (38.9 C), not controlled by medicine.  MAKE SURE YOU:   Understand these instructions.   Will watch your condition.   Will get help right away if you are not doing well or get worse.  Document Released: 09/09/2005 Document Revised: 07/08/2011 Document Reviewed: 01/03/2010 [employer] Patient Information 2012 [employer]. 

## 2012-03-01 NOTE — Assessment & Plan Note (Signed)
Her Bp is well controlled, I will check her lytes and renal function today

## 2012-03-02 ENCOUNTER — Encounter: Payer: Self-pay | Admitting: Gastroenterology

## 2012-03-02 ENCOUNTER — Telehealth: Payer: Self-pay

## 2012-03-02 NOTE — Telephone Encounter (Signed)
Cmet normal: glucose 104, liver functions normal, kidney nl Cholesterol 156, HDL 80.3, LDL 62 -Fabulous profile

## 2012-03-02 NOTE — Telephone Encounter (Signed)
Pt called requesting the result of recent labs. Please advise in Dr Yetta Barre' absence, thanks!

## 2012-03-03 NOTE — Telephone Encounter (Signed)
Left message on machine for pt to return my call  

## 2012-03-03 NOTE — Telephone Encounter (Signed)
Pt advised and copy mailed to address on file.

## 2012-04-11 ENCOUNTER — Ambulatory Visit: Payer: BC Managed Care – PPO | Admitting: Gastroenterology

## 2012-04-13 ENCOUNTER — Ambulatory Visit: Payer: BC Managed Care – PPO | Admitting: Internal Medicine

## 2012-05-08 ENCOUNTER — Ambulatory Visit: Payer: BC Managed Care – PPO | Admitting: Gastroenterology

## 2012-06-06 ENCOUNTER — Encounter: Payer: Self-pay | Admitting: Gastroenterology

## 2012-06-06 ENCOUNTER — Ambulatory Visit (INDEPENDENT_AMBULATORY_CARE_PROVIDER_SITE_OTHER): Payer: BC Managed Care – PPO | Admitting: Gastroenterology

## 2012-06-06 ENCOUNTER — Other Ambulatory Visit (INDEPENDENT_AMBULATORY_CARE_PROVIDER_SITE_OTHER): Payer: BC Managed Care – PPO

## 2012-06-06 VITALS — BP 118/60 | HR 68 | Ht <= 58 in | Wt 87.5 lb

## 2012-06-06 DIAGNOSIS — K625 Hemorrhage of anus and rectum: Secondary | ICD-10-CM

## 2012-06-06 LAB — CBC WITH DIFFERENTIAL/PLATELET
Basophils Absolute: 0.1 10*3/uL (ref 0.0–0.1)
Basophils Relative: 0.8 % (ref 0.0–3.0)
HCT: 35.9 % — ABNORMAL LOW (ref 36.0–46.0)
Hemoglobin: 11.4 g/dL — ABNORMAL LOW (ref 12.0–15.0)
Lymphs Abs: 3.1 10*3/uL (ref 0.7–4.0)
Monocytes Relative: 11.5 % (ref 3.0–12.0)
Neutro Abs: 2.8 10*3/uL (ref 1.4–7.7)
RDW: 13.5 % (ref 11.5–14.6)

## 2012-06-06 MED ORDER — PEG-KCL-NACL-NASULF-NA ASC-C 100 G PO SOLR: 1.0000 | Freq: Once | ORAL | Status: DC

## 2012-06-06 NOTE — Progress Notes (Signed)
Review of pertinent gastrointestinal problems: 1. Routine risk for colon cancer; colonoscopy 07/2007 no polyps.  + hemorrhoids; recommended 10 year recall.  HPI: This is a    pleasant 61 year old woman whom I last saw about 5 years ago.  Has been having rectal bleeding periodically.  Maybe "iI was drinking too many beers recently."  Bleed about once a week lately.      Tends to be constipated, will have BM about every day.  USed to be constipated.  She thinks its because she is eating 2 meals a day instead of 1 a day.  Overall her weight is stable  No colon cancer in family  Drinking about 4 beers per day.  Hb 3 months ago was 11.8, 2 years ago was 14.   Review of systems: Pertinent positive and negative review of systems were noted in the above HPI section. Complete review of systems was performed and was otherwise normal.    Past Medical History  Diagnosis Date  . ALLERGIC RHINITIS 09/01/2007  . Cardiomegaly 01/17/2009  . CONSTIPATION, CHRONIC 12/18/2008  . COPD 04/04/2007  . ELECTROCARDIOGRAM, ABNORMAL 01/17/2009  . GERD 04/04/2007  . HEADACHE, TENSION 04/18/2008  . HEMORRHOIDS 09/25/2007  . HYPERTENSION 06/12/2007  . SINUSITIS- ACUTE-NOS 05/08/2010  . TOBACCO ABUSE 04/04/2007  . UTI 09/30/2010  . Vaginitis and vulvovaginitis, unspecified 09/30/2010  . Anxiety and depression 12/24/2010    Past Surgical History  Procedure Date  . Tonsillectomy   . S/p breast bx     left  . Tubal ligation   . S/p umbilical hernia     Current Outpatient Prescriptions  Medication Sig Dispense Refill  . albuterol (PROAIR HFA) 108 (90 BASE) MCG/ACT inhaler Inhale 2 puffs into the lungs every 6 (six) hours as needed.  1 Inhaler  11  . amLODipine (NORVASC) 10 MG tablet TAKE 1 TABLET BY MOUTH EVERY DAY  90 tablet  2  . aspirin 81 MG tablet Take 81 mg by mouth daily.        Marland Kitchen esomeprazole (NEXIUM) 40 MG packet Take 40 mg by mouth daily. For heartburn       . polyethylene glycol (MIRALAX / GLYCOLAX)  packet Once daily in 8 ounces of juice as needed        Allergies as of 06/06/2012  . (No Known Allergies)    Family History  Problem Relation Age of Onset  . Asthma Mother   . COPD Maternal Uncle   . Hypertension Father   . Stroke Paternal Uncle   . Seizures Father   . Hypertension Mother     History   Social History  . Marital Status: Divorced    Spouse Name: N/A    Number of Children: 1  . Years of Education: N/A   Occupational History  . custodial [employer]   Social History Main Topics  . Smoking status: Current Every Day Smoker -- 1.0 packs/day for 35 years    Types: Cigarettes  . Smokeless tobacco: Never Used  . Alcohol Use: 0.0 oz/week     Comment: 2 cans per day  . Drug Use: No  . Sexually Active: Not Currently   Other Topics Concern  . Not on file   Social History Narrative  . No narrative on file       Physical Exam: BP 118/60  Pulse 68  Ht 4\' 10"  (1.473 m)  Wt 87 lb 8 oz (39.69 kg)  BMI 18.29 kg/m2 Constitutional: generally well-appearing Psychiatric: alert  and oriented x3 Eyes: extraocular movements intact Mouth: oral pharynx moist, no lesions Neck: supple no lymphadenopathy Cardiovascular: heart regular rate and rhythm Lungs: clear to auscultation bilaterally Abdomen: soft, nontender, nondistended, no obvious ascites, no peritoneal signs, normal bowel sounds Extremities: no lower extremity edema bilaterally Skin: no lesions on visible extremities recatl exam: With female assistant in the room: Clear hemorrhoidal tissue externally without any tenderness, no thrombosed hemorrhoids, brown stool   Assessment and plan: 62 y.o. female with  hemorrhoids, recent rectal bleeding  She is going to start fiber supplements to see if we can help her hemorrhoids, prevent them. She has not had a blood count since July and we will repeat that for her now. She was only slightly anemic then. Since her bleeding is relatively new in the past few  months I think we should repeat colonoscopy since her last one was 5 years ago. I want to exclude other, neoplastic causes.

## 2012-06-06 NOTE — Patient Instructions (Addendum)
One of your biggest health concerns is your smoking.  This increases your risk for most cancers and serious cardiovascular diseases such as strokes, heart attacks.  You should try your best to stop.  If you need assistance, please contact your PCP or Smoking Cessation Class at [facility] [phone]) or [facility] Quit-Line (1-800-QUIT-NOW). You will have labs checked today in the basement lab.  Please head down after you check out with the front desk  (cbc) Please start taking citrucel (orange flavored) powder fiber supplement.  This may cause some bloating at first but that usually goes away. Begin with a small spoonful and work your way up to a large, heaping spoonful daily over a week. You will be set up for a colonoscopy (LEC moderate sedation) for rectal bleeding.

## 2012-06-13 ENCOUNTER — Telehealth: Payer: Self-pay | Admitting: Gastroenterology

## 2012-06-13 NOTE — Telephone Encounter (Signed)
Left message on machine to call back  

## 2012-06-13 NOTE — Telephone Encounter (Signed)
Pt returned call and will come by our office and pick up a voucher for a free movi prep

## 2012-06-16 ENCOUNTER — Encounter: Payer: Self-pay | Admitting: Gastroenterology

## 2012-06-16 ENCOUNTER — Ambulatory Visit (AMBULATORY_SURGERY_CENTER): Payer: BC Managed Care – PPO | Admitting: Gastroenterology

## 2012-06-16 VITALS — BP 141/48 | HR 74 | Temp 98.4°F | Resp 23 | Ht <= 58 in | Wt 87.0 lb

## 2012-06-16 DIAGNOSIS — K644 Residual hemorrhoidal skin tags: Secondary | ICD-10-CM

## 2012-06-16 DIAGNOSIS — K625 Hemorrhage of anus and rectum: Secondary | ICD-10-CM

## 2012-06-16 MED ORDER — SODIUM CHLORIDE 0.9 % IV SOLN: 500.0000 mL | INTRAVENOUS | Status: DC

## 2012-06-16 NOTE — Op Note (Signed)
[facility] [address] [place], [address]   COLONOSCOPY PROCEDURE REPORT  PATIENT: Amber Griffith, Amber Griffith  MR#: [id number] BIRTHDATE: August 20, 1950 , 61  yrs. old GENDER: Female ENDOSCOPIST: Rachael Fee, MD PROCEDURE DATE:  06/16/2012 PROCEDURE:   Colonoscopy, diagnostic ASA CLASS:   Class III INDICATIONS:rectal bleeding. MEDICATIONS: Fentanyl 100 mcg IV, Versed 9 mg IV, and These medications were titrated to patient response per physician's verbal order  DESCRIPTION OF PROCEDURE:   After the risks benefits and alternatives of the procedure were thoroughly explained, informed consent was obtained.  A digital rectal exam revealed no abnormalities of the rectum.   The LB [id number]-[id number]  endoscope was introduced through the anus and advanced to the cecum, which was identified by both the appendix and ileocecal valve. No adverse events experienced.   The quality of the prep was good, using MoviPrep  The instrument was then slowly withdrawn as the colon was fully examined.   COLON FINDINGS: A normal appearing cecum, ileocecal valve, and appendiceal orifice were identified.  The ascending, hepatic flexure, transverse, splenic flexure, descending, sigmoid colon and rectum appeared unremarkable.  No polyps or cancers were seen. There were medium to large external anal hemorrhoids.  Retroflexed views revealed no abnormalities. The time to cecum=8 minutes 36 seconds.  Withdrawal time=10 minutes 13 seconds.  The scope was withdrawn and the procedure completed. COMPLICATIONS: There were no complications.  ENDOSCOPIC IMPRESSION: Normal colon; no polyps or cancers Medium to large external anal hemorrhoids  RECOMMENDATIONS: Continue Prep H for your hemorrhoids as needed.  If bleeding persists, please call my office and I will refer you to general surgeon to consider hemorrhoidectomy.   eSigned:  Rachael Fee, MD 06/16/2012 3:43 PM   cc: Sanda Linger,  MD

## 2012-06-16 NOTE — Patient Instructions (Addendum)
One of your biggest health concerns is your smoking.  This increases your risk for most cancers and serious cardiovascular diseases such as strokes, heart attacks.  You should try your best to stop.  If you need assistance, please contact your PCP or Smoking Cessation Class at [place] ([phone]) or [facility]uit[facility] (1-800-QUIT-NOW).  YOU HAD AN ENDOSCOPIC PROCEDURE TODAY AT THE [facility]: Refer to the procedure report that was given to you for any specific questions about what was found during the examination.  If the procedure report does not answer your questions, please call your gastroenterologist to clarify.  If you requested that your care partner not be given the details of your procedure findings, then the procedure report has been included in a sealed envelope for you to review at your convenience later.  YOU SHOULD EXPECT: Some feelings of bloating in the abdomen. Passage of more gas than usual.  Walking can help get rid of the air that was put into your GI tract during the procedure and reduce the bloating. If you had a lower endoscopy (such as a colonoscopy or flexible sigmoidoscopy) you may notice spotting of blood in your stool or on the toilet paper. If you underwent a bowel prep for your procedure, then you may not have a normal bowel movement for a few days.  DIET: Your first meal following the procedure should be a light meal and then it is ok to progress to your normal diet.  A half-sandwich or bowl of soup is an example of a good first meal.  Heavy or fried foods are harder to digest and may make you feel nauseous or bloated.  Likewise meals heavy in dairy and vegetables can cause extra gas to form and this can also increase the bloating.  Drink plenty of fluids but you should avoid alcoholic beverages for 24 hours.  ACTIVITY: Your care partner should take you home directly after the procedure.  You should plan to take it easy, moving slowly for the rest of  the day.  You can resume normal activity the day after the procedure however you should NOT DRIVE or use heavy machinery for 24 hours (because of the sedation medicines used during the test).    SYMPTOMS TO REPORT IMMEDIATELY: A gastroenterologist can be reached at any hour.  During normal business hours, 8:30 AM to 5:00 PM Monday through Friday, call [phone].  After hours and on weekends, please call the GI answering service at [phone] who will take a message and have the physician on call contact you.   Following lower endoscopy (colonoscopy or flexible sigmoidoscopy):  Excessive amounts of blood in the stool  Significant tenderness or worsening of abdominal pains  Swelling of the abdomen that is new, acute  Fever of 100F or higher  FOLLOW UP: If any biopsies were taken you will be contacted by phone or by letter within the next 1-3 weeks.  Call your gastroenterologist if you have not heard about the biopsies in 3 weeks.  Our staff will call the home number listed on your records the next business day following your procedure to check on you and address any questions or concerns that you may have at that time regarding the information given to you following your procedure. This is a courtesy call and so if there is no answer at the home number and we have not heard from you through the emergency physician on call, we will assume that you have returned to   your regular daily activities without incident.  SIGNATURES/CONFIDENTIALITY: You and/or your care partner have signed paperwork which will be entered into your electronic medical record.  These signatures attest to the fact that that the information above on your After Visit Summary has been reviewed and is understood.  Full responsibility of the confidentiality of this discharge information lies with you and/or your care-partner. 

## 2012-06-16 NOTE — Progress Notes (Addendum)
Patient did not have preoperative order for IV antibiotic SSI prophylaxis. (G8918)  Patient did not experience any of the following events: a burn prior to discharge; a fall within the facility; wrong site/side/patient/procedure/implant event; or a hospital transfer or hospital admission upon discharge from the facility. (G8907)  

## 2012-06-19 ENCOUNTER — Telehealth: Payer: Self-pay | Admitting: *Deleted

## 2012-06-19 NOTE — Telephone Encounter (Signed)
  Follow up Call-  Call back number 06/16/2012  Post procedure Call Back phone  # [phone]  Permission to leave phone message Yes     No answer, left message!

## 2012-08-31 ENCOUNTER — Other Ambulatory Visit: Payer: Self-pay | Admitting: Internal Medicine

## 2012-08-31 DIAGNOSIS — Z1231 Encounter for screening mammogram for malignant neoplasm of breast: Secondary | ICD-10-CM

## 2012-09-22 ENCOUNTER — Ambulatory Visit (INDEPENDENT_AMBULATORY_CARE_PROVIDER_SITE_OTHER): Payer: BC Managed Care – PPO | Admitting: Internal Medicine

## 2012-09-22 ENCOUNTER — Encounter: Payer: Self-pay | Admitting: Internal Medicine

## 2012-09-22 VITALS — BP 118/68 | HR 67 | Temp 97.7°F

## 2012-09-22 DIAGNOSIS — M654 Radial styloid tenosynovitis [de Quervain]: Secondary | ICD-10-CM

## 2012-09-22 MED ORDER — DICLOFENAC SODIUM 75 MG PO TBEC: 75.0000 mg | DELAYED_RELEASE_TABLET | Freq: Two times a day (BID) | ORAL | Status: DC

## 2012-09-22 NOTE — Patient Instructions (Signed)
It was good to see you today. Use Diclofenac twice daily with food for the next 7 days, then as needed for pain -Your prescription(s) have been submitted to your pharmacy. Please take as directed and contact our office if you believe you are having problem(s) with the medication(s). Wear the wrist splint as discussed and rest your thumb as you're able If pain unimproved in the next 2 weeks, or if worse, please call for injection as discussed De Quervain's Disease Suzette Battiest disease is a condition often seen in racquet sports where there is a soreness (inflammation) in the cord like structures (tendons) which attach muscle to bone on the thumb side of the wrist. There may be a tightening of the tissuesaround the tendons. This condition is often helped by giving up or modifying the activity which caused it. When conservative treatment does not help, surgery may be required. Conservative treatment could include changes in the activity which brought about the problem or made it worse. Anti-inflammatory medications and injections may be used to help decrease the inflammation and help with pain control. Your caregiver will help you determine which is best for you. DIAGNOSIS  Often the diagnosis (learning what is wrong) can be made by examination. Sometimes x-rays are required. HOME CARE INSTRUCTIONS   Apply ice to the sore area for 15 to 20 minutes, 3 to 4 times per day while awake. Put the ice in a plastic bag and place a towel between the bag of ice and your skin. This is especially helpful if it can be done after all activities involving the sore wrist.  Temporary splinting may help.  Only take over-the-counter or prescription medicines for pain, discomfort or fever as directed by your caregiver. SEEK MEDICAL CARE IF:   Pain relief is not obtained with medications, or if you have increasing pain and seem to be getting worse rather than better. MAKE SURE YOU:   Understand these  instructions.  Will watch your condition.  Will get help right away if you are not doing well or get worse. Document Released: 04/13/2001 Document Revised: 10/11/2011 Document Reviewed: 07/19/2005 [facility] Patient Information 2013 [place], [place].

## 2012-09-22 NOTE — Progress Notes (Signed)
  Subjective:    Patient ID: Amber Griffith, female    DOB: 1950-09-21, 62 y.o.   MRN: [id number]  HPI  complains of L "wrist" pain Onset 3 weeks ago Denies injury associated with swelling Exacerbated by use - lifting trash (works custodial services) Not improved with tylenol - Mod improved temporarily with ice and rest  Past Medical History  Diagnosis Date  . ALLERGIC RHINITIS 09/01/2007  . Cardiomegaly 01/17/2009  . CONSTIPATION, CHRONIC 12/18/2008  . COPD 04/04/2007  . ELECTROCARDIOGRAM, ABNORMAL 01/17/2009  . GERD 04/04/2007  . HEADACHE, TENSION 04/18/2008  . HEMORRHOIDS 09/25/2007  . HYPERTENSION 06/12/2007  . SINUSITIS- ACUTE-NOS 05/08/2010  . TOBACCO ABUSE 04/04/2007  . UTI 09/30/2010  . Vaginitis and vulvovaginitis, unspecified 09/30/2010  . Anxiety and depression 12/24/2010    Review of Systems  Constitutional: Negative for fever and fatigue.  Respiratory: Negative for cough and shortness of breath.   Cardiovascular: Negative for chest pain and palpitations.       Objective:   Physical Exam BP 118/68  Pulse 67  Temp(Src) 97.7 F (36.5 C) (Oral)  SpO2 97% Wt Readings from Last 3 Encounters:  06/16/12 87 lb (39.463 kg)  06/06/12 87 lb 8 oz (39.69 kg)  03/01/12 85 lb 8 oz (38.783 kg)   Constitutional: She appears well-developed and well-nourished. No distress.  Cardiovascular: Normal rate, regular rhythm and normal heart sounds.  No murmur heard. No BLE edema. Pulmonary/Chest: Effort normal and breath sounds normal. No respiratory distress. She has no wheezes.  Musculoskeletal: slight soft tissue swelling over radial side of wrist left side -no effusions or gross deformities. Tender to palpation along abductor pollicis longus over styloid process Psychiatric: She has a normal mood and affect. Her behavior is normal. Judgment and thought content normal.   Lab Results  Component Value Date   WBC 6.9 06/06/2012   HGB 11.4* 06/06/2012   HCT 35.9* 06/06/2012   PLT 242.0  06/06/2012   GLUCOSE 104* 03/01/2012   CHOL 156 03/01/2012   TRIG 68.0 03/01/2012   HDL 80.30 03/01/2012   LDLCALC 62 03/01/2012   ALT 18 03/01/2012   AST 21 03/01/2012   NA 142 03/01/2012   K 4.0 03/01/2012   CL 105 03/01/2012   CREATININE 0.6 03/01/2012   BUN 10 03/01/2012   CO2 30 03/01/2012   TSH 1.82 03/01/2012        Assessment & Plan:   L DeQuervain's tenosynovitis - Education on diagnosis provided to patient today Offered steroid injection which patient declines at this time Treat with oral anti-inflammatories, rest as able (wrist splint provided) and ice Patient to call in the next 2 weeks if unimproved to reconsider steroid injection, sooner if worse

## 2012-10-05 ENCOUNTER — Ambulatory Visit (HOSPITAL_COMMUNITY)
Admission: RE | Admit: 2012-10-05 | Discharge: 2012-10-05 | Disposition: A | Discharge status: Home / Self Care | Payer: BC Managed Care – PPO | Source: Ambulatory Visit | Attending: Internal Medicine | Admitting: Internal Medicine

## 2012-10-05 DIAGNOSIS — Z1231 Encounter for screening mammogram for malignant neoplasm of breast: Secondary | ICD-10-CM | POA: Insufficient documentation

## 2012-11-10 ENCOUNTER — Ambulatory Visit (INDEPENDENT_AMBULATORY_CARE_PROVIDER_SITE_OTHER)
Admission: RE | Admit: 2012-11-10 | Discharge: 2012-11-10 | Disposition: A | Discharge status: Home / Self Care | Payer: BC Managed Care – PPO | Source: Ambulatory Visit | Attending: Internal Medicine | Admitting: Internal Medicine

## 2012-11-10 ENCOUNTER — Encounter: Payer: Self-pay | Admitting: Internal Medicine

## 2012-11-10 ENCOUNTER — Ambulatory Visit (INDEPENDENT_AMBULATORY_CARE_PROVIDER_SITE_OTHER): Payer: BC Managed Care – PPO | Admitting: Internal Medicine

## 2012-11-10 VITALS — BP 122/60 | HR 64 | Temp 98.4°F | Resp 16 | Wt 87.5 lb

## 2012-11-10 DIAGNOSIS — L2089 Other atopic dermatitis: Secondary | ICD-10-CM

## 2012-11-10 DIAGNOSIS — M25532 Pain in left wrist: Secondary | ICD-10-CM

## 2012-11-10 DIAGNOSIS — G5600 Carpal tunnel syndrome, unspecified upper limb: Secondary | ICD-10-CM | POA: Insufficient documentation

## 2012-11-10 DIAGNOSIS — G5602 Carpal tunnel syndrome, left upper limb: Secondary | ICD-10-CM

## 2012-11-10 DIAGNOSIS — M25539 Pain in unspecified wrist: Secondary | ICD-10-CM | POA: Insufficient documentation

## 2012-11-10 DIAGNOSIS — I10 Essential (primary) hypertension: Secondary | ICD-10-CM

## 2012-11-10 DIAGNOSIS — L209 Atopic dermatitis, unspecified: Secondary | ICD-10-CM | POA: Insufficient documentation

## 2012-11-10 MED ORDER — TRIAMCINOLONE ACETONIDE 0.5 % EX CREA: TOPICAL_CREAM | Freq: Three times a day (TID) | CUTANEOUS | Status: DC

## 2012-11-10 NOTE — Patient Instructions (Signed)

## 2012-11-10 NOTE — Progress Notes (Signed)
Subjective:    Patient ID: Amber Griffith, female    DOB: 06-08-1951, 62 y.o.   MRN: [id number]  Arthritis Presents for follow-up visit. She complains of pain. She reports no stiffness, joint swelling or joint warmth. The symptoms have been worsening. Affected locations include the left wrist. Her pain is at a severity of 3/10. Associated symptoms include rash (itchy, dry area on left lower abd). Pertinent negatives include no diarrhea, dry eyes, dry mouth, fatigue, fever, pain at night, pain while resting, Raynaud's syndrome, uveitis or weight loss. Compliance with total regimen is 76-100% (she has been wearing a splint with no improvement). Compliance with medications is 76-100%.      Review of Systems  Constitutional: Negative.  Negative for fever, chills, weight loss, diaphoresis, activity change, appetite change, fatigue and unexpected weight change.  HENT: Negative.   Eyes: Negative.   Respiratory: Negative.  Negative for cough, chest tightness, shortness of breath, wheezing and stridor.   Cardiovascular: Negative.  Negative for chest pain, palpitations and leg swelling.  Gastrointestinal: Negative.  Negative for nausea, vomiting, abdominal pain, diarrhea and constipation.  Endocrine: Negative.   Genitourinary: Negative.   Musculoskeletal: Positive for arthritis. Negative for myalgias, back pain, joint swelling, gait problem and stiffness.  Skin: Positive for rash (itchy, dry area on left lower abd). Negative for color change, pallor and wound.  Allergic/Immunologic: Negative.   Neurological: Negative.  Negative for tremors, weakness and numbness.  Hematological: Negative.  Negative for adenopathy. Does not bruise/bleed easily.  Psychiatric/Behavioral: Negative.        Objective:   Physical Exam  Vitals reviewed. Constitutional: She is oriented to person, place, and time. She appears well-developed and well-nourished. No distress.  HENT:  Head: Normocephalic and atraumatic.    Mouth/Throat: Oropharynx is clear and moist. No oropharyngeal exudate.  Eyes: Conjunctivae are normal. Right eye exhibits no discharge. Left eye exhibits no discharge. No scleral icterus.  Neck: Normal range of motion. Neck supple. No JVD present. No tracheal deviation present. No thyromegaly present.  Cardiovascular: Normal rate, regular rhythm, normal heart sounds and intact distal pulses.  Exam reveals no gallop and no friction rub.   No murmur heard. Pulmonary/Chest: Effort normal and breath sounds normal. No stridor. No respiratory distress. She has no wheezes. She has no rales. She exhibits no tenderness.  Abdominal: Soft. Bowel sounds are normal. She exhibits no distension and no mass. There is no tenderness. There is no rebound and no guarding.  Musculoskeletal: Normal range of motion. She exhibits no edema and no tenderness.       Left wrist: She exhibits bony tenderness (over the medial/distal radius). She exhibits normal range of motion, no tenderness, no swelling, no effusion, no crepitus, no deformity and no laceration.  Lymphadenopathy:    She has no cervical adenopathy.  Neurological: She is alert and oriented to person, place, and time. She has normal reflexes. She displays normal reflexes. No cranial nerve deficit. She exhibits normal muscle tone. Coordination normal.  Skin: Skin is warm, dry and intact. Rash noted. No abrasion, no bruising, no burn, no ecchymosis, no laceration, no lesion, no petechiae and no purpura noted. Rash is macular. Rash is not papular, not maculopapular, not nodular, not pustular, not vesicular and not urticarial. She is not diaphoretic. No cyanosis or erythema. No pallor. Nails show no clubbing.     Psychiatric: She has a normal mood and affect. Her behavior is normal. Judgment and thought content normal.      Lab Results  Component Value Date   WBC 6.9 06/06/2012   HGB 11.4* 06/06/2012   HCT 35.9* 06/06/2012   PLT 242.0 06/06/2012   GLUCOSE 104*  03/01/2012   CHOL 156 03/01/2012   TRIG 68.0 03/01/2012   HDL 80.30 03/01/2012   LDLCALC 62 03/01/2012   ALT 18 03/01/2012   AST 21 03/01/2012   NA 142 03/01/2012   K 4.0 03/01/2012   CL 105 03/01/2012   CREATININE 0.6 03/01/2012   BUN 10 03/01/2012   CO2 30 03/01/2012   TSH 1.82 03/01/2012      Assessment & Plan:

## 2012-11-13 NOTE — Assessment & Plan Note (Signed)
The plain films are normal I am concerned that she has CTS so I have asked her to get a NCS/EMG done

## 2012-11-13 NOTE — Assessment & Plan Note (Signed)
She will continue wearing the splint and taking nsaids I have ordered a NCS/EMG to see how severe this is

## 2012-11-13 NOTE — Assessment & Plan Note (Signed)
Start TAC cream

## 2012-11-13 NOTE — Assessment & Plan Note (Signed)
Her BP is well controlled 

## 2012-11-14 ENCOUNTER — Telehealth: Payer: Self-pay

## 2012-11-14 NOTE — Telephone Encounter (Signed)
Patient called [facility] requesting xray results. She states that she was advised at appt that she may need "some type of treatment for arthritis"

## 2012-11-14 NOTE — Telephone Encounter (Signed)
Her xray was normal She needs to be tested for CTS so I ordered NCS/EMG

## 2012-11-15 ENCOUNTER — Telehealth: Payer: Self-pay

## 2012-11-15 NOTE — Telephone Encounter (Signed)
Pt calls, states she was seen Friday and is wanting the results of her xray. Please advise.

## 2012-11-15 NOTE — Telephone Encounter (Signed)
The x-ray was normal

## 2012-11-15 NOTE — Telephone Encounter (Signed)
Pt was notified of results earlier today.

## 2012-11-15 NOTE — Telephone Encounter (Signed)
Pt notified per MD 

## 2012-12-29 ENCOUNTER — Other Ambulatory Visit: Payer: Self-pay | Admitting: Internal Medicine

## 2013-03-05 ENCOUNTER — Ambulatory Visit (INDEPENDENT_AMBULATORY_CARE_PROVIDER_SITE_OTHER): Payer: BC Managed Care – PPO | Admitting: Internal Medicine

## 2013-03-05 ENCOUNTER — Encounter: Payer: Self-pay | Admitting: *Deleted

## 2013-03-05 ENCOUNTER — Encounter: Payer: Self-pay | Admitting: Internal Medicine

## 2013-03-05 VITALS — BP 120/60 | HR 63 | Temp 97.9°F | Wt 85.8 lb

## 2013-03-05 DIAGNOSIS — J309 Allergic rhinitis, unspecified: Secondary | ICD-10-CM

## 2013-03-05 MED ORDER — METHYLPREDNISOLONE ACETATE 80 MG/ML IJ SUSP
80.0000 mg | Freq: Once | INTRAMUSCULAR | Status: AC
  Administered 2013-03-05: 80 mg via INTRAMUSCULAR

## 2013-03-05 NOTE — Patient Instructions (Signed)
Allergic Rhinitis Allergic rhinitis is when the mucous membranes in the nose respond to allergens. Allergens are particles in the air that cause your body to have an allergic reaction. This causes you to release allergic antibodies. Through a chain of events, these eventually cause you to release histamine into the blood stream (hence the use of antihistamines). Although meant to be protective to the body, it is this release that causes your discomfort, such as frequent sneezing, congestion and an itchy runny nose.  CAUSES  The pollen allergens may come from grasses, trees, and weeds. This is seasonal allergic rhinitis, or "hay fever." Other allergens cause year-round allergic rhinitis (perennial allergic rhinitis) such as house dust mite allergen, pet dander and mold spores.  SYMPTOMS   Nasal stuffiness (congestion).  Runny, itchy nose with sneezing and tearing of the eyes.  There is often an itching of the mouth, eyes and ears. It cannot be cured, but it can be controlled with medications. DIAGNOSIS  If you are unable to determine the offending allergen, skin or blood testing may find it. TREATMENT   Avoid the allergen.  Medications and allergy shots (immunotherapy) can help.  Hay fever may often be treated with antihistamines in pill or nasal spray forms. Antihistamines block the effects of histamine. There are over-the-counter medicines that may help with nasal congestion and swelling around the eyes. Check with your caregiver before taking or giving this medicine. If the treatment above does not work, there are many new medications your caregiver can prescribe. Stronger medications may be used if initial measures are ineffective. Desensitizing injections can be used if medications and avoidance fails. Desensitization is when a patient is given ongoing shots until the body becomes less sensitive to the allergen. Make sure you follow up with your caregiver if problems continue. SEEK MEDICAL  CARE IF:   You develop fever (more than 100.5 F (38.1 C).  You develop a cough that does not stop easily (persistent).  You have shortness of breath.  You start wheezing.  Symptoms interfere with normal daily activities. Document Released: 04/13/2001 Document Revised: 10/11/2011 Document Reviewed: 10/23/2008 [employer] Patient Information 2014 [employer].  

## 2013-03-05 NOTE — Progress Notes (Signed)
HPI  Pt presents to the clinic today with c/o headache and nasal congestion. This started 4 days ago. She denies runny nose, fever, chills or cough. She has taken OTC allergy pills and ibuprofen which has not helped. She does have a history of allergies or asthma. She has not had sick contacts. She is a current smoker, 1 ppd.  Review of Systems    Past Medical History  Diagnosis Date  . ALLERGIC RHINITIS 09/01/2007  . Cardiomegaly 01/17/2009  . CONSTIPATION, CHRONIC 12/18/2008  . COPD 04/04/2007  . ELECTROCARDIOGRAM, ABNORMAL 01/17/2009  . GERD 04/04/2007  . HEADACHE, TENSION 04/18/2008  . HEMORRHOIDS 09/25/2007  . HYPERTENSION 06/12/2007  . SINUSITIS- ACUTE-NOS 05/08/2010  . TOBACCO ABUSE 04/04/2007  . UTI 09/30/2010  . Vaginitis and vulvovaginitis, unspecified 09/30/2010  . Anxiety and depression 12/24/2010    Family History  Problem Relation Age of Onset  . Asthma Mother   . COPD Maternal Uncle   . Hypertension Father   . Stroke Paternal Uncle   . Seizures Father   . Hypertension Mother     History   Social History  . Marital Status: Divorced    Spouse Name: N/A    Number of Children: 1  . Years of Education: N/A   Occupational History  . custodial [employer]   Social History Main Topics  . Smoking status: Current Every Day Smoker -- 1.00 packs/day for 35 years    Types: Cigarettes  . Smokeless tobacco: Never Used  . Alcohol Use: 0.0 oz/week     Comment: 2 cans per day  . Drug Use: No  . Sexually Active: Not Currently   Other Topics Concern  . Not on file   Social History Narrative  . No narrative on file    No Known Allergies   Constitutional: Positive headache, fatigue Denies fever or abrupt weight changes.  HEENT:  Positive eye pain, pressure behind the eyes, nasal congestion. Denies eye redness, ear pain, ringing in the ears, wax buildup, runny nose or bloody nose. Respiratory: Positive cough. Denies difficulty breathing or shortness of breath.   Cardiovascular: Denies chest pain, chest tightness, palpitations or swelling in the hands or feet.   No other specific complaints in a complete review of systems (except as listed in HPI above).  Objective:    BP 120/60  Pulse 63  Temp(Src) 97.9 F (36.6 C) (Oral)  Wt 85 lb 12.8 oz (38.919 kg)  BMI 17.94 kg/m2  SpO2 97% Wt Readings from Last 3 Encounters:  03/05/13 85 lb 12.8 oz (38.919 kg)  11/10/12 87 lb 8 oz (39.69 kg)  06/16/12 87 lb (39.463 kg)    General: Appears her stated age, chronically ill appearing in NAD. HEENT: Head: normal shape and size; Eyes: sclera white, no icterus, conjunctiva pink, PERRLA and EOMs intact; Ears: Tm's gray and intact, normal light reflex; Nose: mucosa pink and moist, septum midline; Throat/Mouth: + PND. Teeth present, mucosa pink and moist, no exudate noted, no lesions or ulcerations noted.  Neck: Mild tonsillar lymphadenopathy. Neck supple, trachea midline. No massses, lumps or thyromegaly present.  Cardiovascular: Normal rate and rhythm. S1,S2 noted.  No murmur, rubs or gallops noted. No JVD or BLE edema. No carotid bruits noted. Pulmonary/Chest: Normal effort and positive vesicular breath sounds. No respiratory distress. No wheezes, rales or ronchi noted.      Assessment & Plan:   Allergic Rhinitis recurrent:  Can use a Neti Pot which can be purchased from your local drug  store. Continue your OTC allergy medication Will give 80 mg Depo IM today  RTC as needed or if symptoms persist or worsen  RTC as needed or if symptoms persist.

## 2013-03-05 NOTE — Addendum Note (Signed)
Addended by: Deatra James on: 03/05/2013 10:46 AM   Modules accepted: Orders

## 2013-07-30 ENCOUNTER — Encounter: Payer: Self-pay | Admitting: Internal Medicine

## 2013-07-30 ENCOUNTER — Other Ambulatory Visit (INDEPENDENT_AMBULATORY_CARE_PROVIDER_SITE_OTHER): Payer: BC Managed Care – PPO

## 2013-07-30 ENCOUNTER — Ambulatory Visit (INDEPENDENT_AMBULATORY_CARE_PROVIDER_SITE_OTHER): Payer: BC Managed Care – PPO | Admitting: Internal Medicine

## 2013-07-30 ENCOUNTER — Ambulatory Visit (INDEPENDENT_AMBULATORY_CARE_PROVIDER_SITE_OTHER)
Admission: RE | Admit: 2013-07-30 | Discharge: 2013-07-30 | Disposition: A | Discharge status: Home / Self Care | Payer: BC Managed Care – PPO | Source: Ambulatory Visit | Attending: Internal Medicine | Admitting: Internal Medicine

## 2013-07-30 VITALS — BP 115/60 | HR 80 | Temp 98.4°F | Resp 16 | Wt 88.0 lb

## 2013-07-30 DIAGNOSIS — J309 Allergic rhinitis, unspecified: Secondary | ICD-10-CM

## 2013-07-30 DIAGNOSIS — F17200 Nicotine dependence, unspecified, uncomplicated: Secondary | ICD-10-CM

## 2013-07-30 DIAGNOSIS — R059 Cough, unspecified: Secondary | ICD-10-CM

## 2013-07-30 DIAGNOSIS — R911 Solitary pulmonary nodule: Secondary | ICD-10-CM

## 2013-07-30 DIAGNOSIS — R7309 Other abnormal glucose: Secondary | ICD-10-CM | POA: Insufficient documentation

## 2013-07-30 DIAGNOSIS — R05 Cough: Secondary | ICD-10-CM | POA: Insufficient documentation

## 2013-07-30 DIAGNOSIS — I10 Essential (primary) hypertension: Secondary | ICD-10-CM

## 2013-07-30 DIAGNOSIS — J449 Chronic obstructive pulmonary disease, unspecified: Secondary | ICD-10-CM

## 2013-07-30 DIAGNOSIS — I517 Cardiomegaly: Secondary | ICD-10-CM

## 2013-07-30 DIAGNOSIS — J4489 Other specified chronic obstructive pulmonary disease: Secondary | ICD-10-CM

## 2013-07-30 DIAGNOSIS — D510 Vitamin B12 deficiency anemia due to intrinsic factor deficiency: Secondary | ICD-10-CM

## 2013-07-30 DIAGNOSIS — D509 Iron deficiency anemia, unspecified: Secondary | ICD-10-CM

## 2013-07-30 LAB — CBC WITH DIFFERENTIAL/PLATELET
Basophils Absolute: 0 10*3/uL (ref 0.0–0.1)
Eosinophils Absolute: 0.1 10*3/uL (ref 0.0–0.7)
HCT: 39.3 % (ref 36.0–46.0)
Hemoglobin: 12.9 g/dL (ref 12.0–15.0)
Lymphocytes Relative: 32.2 % (ref 12.0–46.0)
Lymphs Abs: 2 10*3/uL (ref 0.7–4.0)
MCHC: 32.7 g/dL (ref 30.0–36.0)
MCV: 89.9 fl (ref 78.0–100.0)
Monocytes Absolute: 0.9 10*3/uL (ref 0.1–1.0)
Neutro Abs: 3.3 10*3/uL (ref 1.4–7.7)
RDW: 13.4 % (ref 11.5–14.6)

## 2013-07-30 MED ORDER — METHYLPREDNISOLONE ACETATE 80 MG/ML IJ SUSP: 120.0000 mg | Freq: Once | INTRAMUSCULAR | Status: DC

## 2013-07-30 MED ORDER — HYDROCOD POLST-CHLORPHEN POLST 10-8 MG/5ML PO LQCR: 5.0000 mL | Freq: Two times a day (BID) | ORAL | Status: DC | PRN

## 2013-07-30 NOTE — Progress Notes (Signed)
Subjective:    Patient ID: Amber Griffith, female    DOB: Apr 24, 1951, 62 y.o.   MRN: [id number]  URI  This is a new problem. The current episode started in the past 7 days. The problem has been unchanged. There has been no fever. Associated symptoms include congestion, coughing (intermittent NP cough) and rhinorrhea. Pertinent negatives include no abdominal pain, chest pain, diarrhea, dysuria, ear pain, headaches, joint pain, joint swelling, nausea, neck pain, plugged ear sensation, rash, sinus pain, sneezing, sore throat, swollen glands, vomiting or wheezing. She has tried antihistamine for the symptoms. The treatment provided mild relief.      Review of Systems  Constitutional: Negative.  Negative for fever, chills, diaphoresis, activity change, appetite change, fatigue and unexpected weight change.  HENT: Positive for congestion, postnasal drip and rhinorrhea. Negative for ear pain, nosebleeds, sinus pressure, sneezing, sore throat, tinnitus, trouble swallowing and voice change.   Eyes: Negative.   Respiratory: Positive for cough (intermittent NP cough). Negative for apnea, choking, chest tightness, shortness of breath, wheezing and stridor.   Cardiovascular: Negative.  Negative for chest pain, palpitations and leg swelling.  Gastrointestinal: Negative.  Negative for nausea, vomiting, abdominal pain, diarrhea, constipation and blood in stool.  Endocrine: Negative.   Genitourinary: Negative.  Negative for dysuria.  Musculoskeletal: Negative.  Negative for joint pain and neck pain.  Skin: Negative.  Negative for rash.  Allergic/Immunologic: Negative.   Neurological: Negative.  Negative for dizziness, tremors, weakness, light-headedness and headaches.  Hematological: Negative.  Negative for adenopathy. Does not bruise/bleed easily.  Psychiatric/Behavioral: Negative.        Objective:   Physical Exam  Vitals reviewed. Constitutional: She is oriented to person, place, and time. She  appears well-developed and well-nourished.  Non-toxic appearance. She does not have a sickly appearance. She does not appear ill. No distress.  HENT:  Head: Normocephalic and atraumatic.  Right Ear: Hearing, tympanic membrane, external ear and ear canal normal.  Left Ear: Hearing, tympanic membrane, external ear and ear canal normal.  Nose: Nose normal. No mucosal edema, rhinorrhea or sinus tenderness.  No foreign bodies. Right sinus exhibits no maxillary sinus tenderness and no frontal sinus tenderness. Left sinus exhibits no maxillary sinus tenderness and no frontal sinus tenderness.  Mouth/Throat: Oropharynx is clear and moist and mucous membranes are normal. Mucous membranes are not pale, not dry and not cyanotic. No oral lesions. No trismus in the jaw. No uvula swelling. No oropharyngeal exudate, posterior oropharyngeal edema, posterior oropharyngeal erythema or tonsillar abscesses.  Eyes: Conjunctivae are normal. Right eye exhibits no discharge. Left eye exhibits no discharge. No scleral icterus.  Neck: Normal range of motion. Neck supple. No JVD present. No tracheal deviation present. No thyromegaly present.  Cardiovascular: Normal rate, regular rhythm, normal heart sounds and intact distal pulses.  Exam reveals no gallop and no friction rub.   No murmur heard. Pulmonary/Chest: Effort normal and breath sounds normal. No accessory muscle usage or stridor. Not tachypneic. No respiratory distress. She has no decreased breath sounds. She has no wheezes. She has no rhonchi. She has no rales. She exhibits no tenderness.  Abdominal: Soft. Bowel sounds are normal. She exhibits no distension and no mass. There is no tenderness. There is no rebound and no guarding.  Musculoskeletal: Normal range of motion. She exhibits no edema and no tenderness.  Lymphadenopathy:    She has no cervical adenopathy.  Neurological: She is oriented to person, place, and time.  Skin: Skin is warm and dry. No  rash noted.  She is not diaphoretic. No erythema. No pallor.     Lab Results  Component Value Date   WBC 6.9 06/06/2012   HGB 11.4* 06/06/2012   HCT 35.9* 06/06/2012   PLT 242.0 06/06/2012   GLUCOSE 104* 03/01/2012   CHOL 156 03/01/2012   TRIG 68.0 03/01/2012   HDL 80.30 03/01/2012   LDLCALC 62 03/01/2012   ALT 18 03/01/2012   AST 21 03/01/2012   NA 142 03/01/2012   K 4.0 03/01/2012   CL 105 03/01/2012   CREATININE 0.6 03/01/2012   BUN 10 03/01/2012   CO2 30 03/01/2012   TSH 1.82 03/01/2012       Assessment & Plan:

## 2013-07-30 NOTE — Progress Notes (Signed)
Pre visit review using our clinic review tool, if applicable. No additional management support is needed unless otherwise documented below in the visit note. 

## 2013-07-30 NOTE — Patient Instructions (Signed)

## 2013-07-31 ENCOUNTER — Encounter: Payer: Self-pay | Admitting: Internal Medicine

## 2013-07-31 DIAGNOSIS — D509 Iron deficiency anemia, unspecified: Secondary | ICD-10-CM | POA: Insufficient documentation

## 2013-07-31 LAB — IBC PANEL: Transferrin: 222.6 mg/dL (ref 212.0–360.0)

## 2013-07-31 LAB — BASIC METABOLIC PANEL
BUN: 13 mg/dL (ref 6–23)
Calcium: 8.4 mg/dL (ref 8.4–10.5)
Creatinine, Ser: 0.7 mg/dL (ref 0.4–1.2)
GFR: 115.08 mL/min (ref >60.00)

## 2013-07-31 LAB — VITAMIN B12: Vitamin B-12: 373 pg/mL (ref 211–911)

## 2013-07-31 LAB — FOLATE: Folate: 15.2 ng/mL (ref >5.9)

## 2013-07-31 LAB — FERRITIN: Ferritin: 116.4 ng/mL (ref 10.0–291.0)

## 2013-07-31 LAB — TSH: TSH: 1.09 u[IU]/mL (ref 0.35–5.50)

## 2013-07-31 MED ORDER — FERRALET 90 90-1 MG PO TABS: 1.0000 | ORAL_TABLET | Freq: Every day | ORAL | Status: DC

## 2013-07-31 NOTE — Assessment & Plan Note (Signed)
I will recheck her CBC and will look at her vitamin levels as well 

## 2013-07-31 NOTE — Assessment & Plan Note (Signed)
Her BP is well controlled 

## 2013-07-31 NOTE — Assessment & Plan Note (Signed)
She agrees to quit smoking, she is already down to 4-6 cigs per day

## 2013-07-31 NOTE — Addendum Note (Signed)
Addended by: Etta Grandchild on: 07/31/2013 04:54 PM   Modules accepted: Orders

## 2013-07-31 NOTE — Assessment & Plan Note (Signed)
Repeat CXR looks real good

## 2013-07-31 NOTE — Assessment & Plan Note (Signed)
I will check her A1C to see if she has developed DM2 

## 2013-07-31 NOTE — Assessment & Plan Note (Signed)
She has a viral URI, her CXR is normal Antibiotics are not indicated She will try tussionex susp as needed for the cough

## 2013-07-31 NOTE — Assessment & Plan Note (Signed)
She is having a flare up of this so I gave her an injection of depo-medrol IM

## 2013-08-29 ENCOUNTER — Telehealth: Payer: Self-pay | Admitting: Internal Medicine

## 2013-08-29 NOTE — Telephone Encounter (Signed)
Relevant patient education assigned to patient using Emmi. ° °

## 2013-08-30 ENCOUNTER — Telehealth: Payer: Self-pay

## 2013-08-30 NOTE — Telephone Encounter (Signed)
Phone call from patient [phone] states she has not been on her BP medication for 2 weeks. She gets paid tomorrow and would be able to get it but she wants to know if this is something she needs to continue? Or can she stop taking her BP med.

## 2013-08-30 NOTE — Telephone Encounter (Signed)
Please cont the BP medication

## 2013-08-30 NOTE — Telephone Encounter (Signed)
Patient advised via voicemail to continue BP med

## 2013-09-18 ENCOUNTER — Other Ambulatory Visit: Payer: Self-pay | Admitting: Internal Medicine

## 2013-09-18 DIAGNOSIS — Z1231 Encounter for screening mammogram for malignant neoplasm of breast: Secondary | ICD-10-CM

## 2013-10-08 ENCOUNTER — Ambulatory Visit (HOSPITAL_COMMUNITY): Admission: RE | Admit: 2013-10-08 | Payer: BC Managed Care – PPO | Source: Ambulatory Visit

## 2013-10-11 ENCOUNTER — Ambulatory Visit (HOSPITAL_COMMUNITY): Payer: BC Managed Care – PPO

## 2013-10-26 ENCOUNTER — Other Ambulatory Visit: Payer: Self-pay | Admitting: Internal Medicine

## 2013-10-26 ENCOUNTER — Ambulatory Visit (HOSPITAL_COMMUNITY)
Admission: RE | Admit: 2013-10-26 | Discharge: 2013-10-26 | Disposition: A | Discharge status: Home / Self Care | Payer: BC Managed Care – PPO | Source: Ambulatory Visit | Attending: Internal Medicine | Admitting: Internal Medicine

## 2013-10-26 DIAGNOSIS — Z1231 Encounter for screening mammogram for malignant neoplasm of breast: Secondary | ICD-10-CM | POA: Insufficient documentation

## 2013-10-29 LAB — HM MAMMOGRAPHY: HM Mammogram: NORMAL

## 2013-11-23 ENCOUNTER — Emergency Department (HOSPITAL_COMMUNITY)
Admission: EM | Admit: 2013-11-23 | Discharge: 2013-11-23 | Disposition: A | Discharge status: Home / Self Care | Payer: BC Managed Care – PPO | Attending: Emergency Medicine | Admitting: Emergency Medicine

## 2013-11-23 ENCOUNTER — Encounter (HOSPITAL_COMMUNITY): Payer: Self-pay | Admitting: Emergency Medicine

## 2013-11-23 DIAGNOSIS — Z79899 Other long term (current) drug therapy: Secondary | ICD-10-CM | POA: Insufficient documentation

## 2013-11-23 DIAGNOSIS — Z8669 Personal history of other diseases of the nervous system and sense organs: Secondary | ICD-10-CM | POA: Insufficient documentation

## 2013-11-23 DIAGNOSIS — K5909 Other constipation: Secondary | ICD-10-CM | POA: Insufficient documentation

## 2013-11-23 DIAGNOSIS — K219 Gastro-esophageal reflux disease without esophagitis: Secondary | ICD-10-CM | POA: Insufficient documentation

## 2013-11-23 DIAGNOSIS — Z87440 Personal history of urinary (tract) infections: Secondary | ICD-10-CM | POA: Insufficient documentation

## 2013-11-23 DIAGNOSIS — Y929 Unspecified place or not applicable: Secondary | ICD-10-CM | POA: Insufficient documentation

## 2013-11-23 DIAGNOSIS — F329 Major depressive disorder, single episode, unspecified: Secondary | ICD-10-CM | POA: Insufficient documentation

## 2013-11-23 DIAGNOSIS — J449 Chronic obstructive pulmonary disease, unspecified: Secondary | ICD-10-CM | POA: Insufficient documentation

## 2013-11-23 DIAGNOSIS — J4489 Other specified chronic obstructive pulmonary disease: Secondary | ICD-10-CM | POA: Insufficient documentation

## 2013-11-23 DIAGNOSIS — F411 Generalized anxiety disorder: Secondary | ICD-10-CM | POA: Insufficient documentation

## 2013-11-23 DIAGNOSIS — Y939 Activity, unspecified: Secondary | ICD-10-CM | POA: Insufficient documentation

## 2013-11-23 DIAGNOSIS — F3289 Other specified depressive episodes: Secondary | ICD-10-CM | POA: Insufficient documentation

## 2013-11-23 DIAGNOSIS — S90569A Insect bite (nonvenomous), unspecified ankle, initial encounter: Secondary | ICD-10-CM | POA: Insufficient documentation

## 2013-11-23 DIAGNOSIS — W57XXXA Bitten or stung by nonvenomous insect and other nonvenomous arthropods, initial encounter: Principal | ICD-10-CM

## 2013-11-23 DIAGNOSIS — Z7982 Long term (current) use of aspirin: Secondary | ICD-10-CM | POA: Insufficient documentation

## 2013-11-23 DIAGNOSIS — I10 Essential (primary) hypertension: Secondary | ICD-10-CM | POA: Insufficient documentation

## 2013-11-23 DIAGNOSIS — F17200 Nicotine dependence, unspecified, uncomplicated: Secondary | ICD-10-CM | POA: Insufficient documentation

## 2013-11-23 DIAGNOSIS — S80869A Insect bite (nonvenomous), unspecified lower leg, initial encounter: Secondary | ICD-10-CM

## 2013-11-23 NOTE — ED Provider Notes (Signed)
CSN: [id number]     Arrival date & time 11/23/13  0908 History   First MD [phone] Contact with Patient 11/23/13 0935     Chief Complaint  Patient presents with  . tick bite      (Consider location/radiation/quality/duration/timing/severity/associated sxs/prior Treatment) HPI Comments: Pt found a tick on her right lower leg today after walking her dog while she was at work.  She burned it and removed the tick but states it has been slightly itchy.  No bleeding or drainage from site.  Tick completely removed.  Has checked and she has no other ticks on her body.  States was not there prior to walk.  The history is provided by the patient.    Past Medical History  Diagnosis Date  . ALLERGIC RHINITIS 09/01/2007  . Cardiomegaly 01/17/2009  . CONSTIPATION, CHRONIC 12/18/2008  . COPD 04/04/2007  . ELECTROCARDIOGRAM, ABNORMAL 01/17/2009  . GERD 04/04/2007  . HEADACHE, TENSION 04/18/2008  . HEMORRHOIDS 09/25/2007  . HYPERTENSION 06/12/2007  . SINUSITIS- ACUTE-NOS 05/08/2010  . TOBACCO ABUSE 04/04/2007  . UTI 09/30/2010  . Vaginitis and vulvovaginitis, unspecified 09/30/2010  . Anxiety and depression 12/24/2010   Past Surgical History  Procedure Laterality Date  . Tonsillectomy    . S/p breast bx      left  . Tubal ligation    . S/p umbilical hernia     Family History  Problem Relation Age of Onset  . Asthma Mother   . COPD Maternal Uncle   . Hypertension Father   . Stroke Paternal Uncle   . Seizures Father   . Hypertension Mother    History  Substance Use Topics  . Smoking status: Current Every Day Smoker -- 1.00 packs/day for 35 years    Types: Cigarettes  . Smokeless tobacco: Never Used  . Alcohol Use: 0.0 oz/week     Comment: 2 cans per day   OB History   Grav Para Term Preterm Abortions TAB SAB Ect Mult Living                 Review of Systems  All other systems reviewed and are negative.     Allergies  Review of patient's allergies indicates no known  allergies.  Home Medications   Prior to Admission medications   Medication Sig Start Date End Date Taking? Authorizing Provider  amLODipine (NORVASC) 10 MG tablet Take 10 mg by mouth daily.   Yes Historical Provider, MD  albuterol (PROAIR HFA) 108 (90 BASE) MCG/ACT inhaler Inhale 2 puffs into the lungs every 6 (six) hours as needed. 02/01/11   Etta Grandchildhomas L Jones, MD  aspirin 81 MG tablet Take 81 mg by mouth daily.      Historical Provider, MD  chlorpheniramine-HYDROcodone (TUSSIONEX PENNKINETIC ER) 10-8 MG/5ML LQCR Take 5 mLs by mouth every 12 (twelve) hours as needed for cough. 07/30/13   Etta Grandchildhomas L Jones, MD  esomeprazole (NEXIUM) 40 MG packet Take 40 mg by mouth daily. For heartburn     Historical Provider, MD  Fe Cbn-Fe Gluc-FA-B12-C-DSS (FERRALET 90) 90-1 MG TABS Take 1 tablet by mouth daily. 07/31/13   Etta Grandchildhomas L Jones, MD  polyethylene glycol [facility](MIRALAX / Ethelene HalGLYCOLAX) packet Once daily in 8 ounces of juice as needed    Historical Provider, MD   BP 119/57  Pulse 88  Temp(Src) 97.6 F (36.4 C) (Oral)  Resp 16  SpO2 100% Physical Exam  Nursing note and vitals reviewed. Constitutional: She is oriented to person, place, and time. She appears  well-developed and well-nourished. No distress.  HENT:  Head: Normocephalic and atraumatic.  Eyes: EOM are normal. Pupils are equal, round, and reactive to light.  Cardiovascular: Normal rate.   Pulmonary/Chest: Effort normal.  Neurological: She is alert and oriented to person, place, and time.  Skin:     Psychiatric: She has a normal mood and affect. Her behavior is normal.    ED Course  Procedures (including critical care time) Labs Review Labs Reviewed - No data to display  Imaging Review No results found.   EKG Interpretation None      MDM   Final diagnoses:  Tick bite of calf   Patient with a tic today and came for further evaluation. Tic was completely removed there was no head present and there is a small hematoma but otherwise  normal without allergic reaction or signs of infection.  Pt given warning sx for RMSF and wound care.    Gwyneth SproutWhitney Lotus Santillo, MD 11/23/13 1009

## 2013-11-23 NOTE — ED Notes (Signed)
Pt reports she had a tick on her leg in the last hour. Pt pulled ticked off. Small red spot where tick bite was on left outer calf. Pt reports area around bite was red and swollen, now has resolved, reports left foot is tingling. Denies pain, ambulatory.

## 2013-11-30 ENCOUNTER — Other Ambulatory Visit: Payer: Self-pay | Admitting: Internal Medicine

## 2014-02-05 ENCOUNTER — Other Ambulatory Visit: Payer: Self-pay | Admitting: Internal Medicine

## 2014-03-04 ENCOUNTER — Ambulatory Visit (INDEPENDENT_AMBULATORY_CARE_PROVIDER_SITE_OTHER): Payer: BC Managed Care – PPO | Admitting: Internal Medicine

## 2014-03-04 ENCOUNTER — Encounter: Payer: Self-pay | Admitting: Internal Medicine

## 2014-03-04 VITALS — BP 140/70 | HR 67 | Temp 98.0°F | Wt 88.5 lb

## 2014-03-04 DIAGNOSIS — J440 Chronic obstructive pulmonary disease with acute lower respiratory infection: Secondary | ICD-10-CM

## 2014-03-04 MED ORDER — AZITHROMYCIN 250 MG PO TABS: ORAL_TABLET | ORAL | Status: DC

## 2014-03-04 NOTE — Patient Instructions (Addendum)
Breo one inhalations daily; gargle and spit after use. Lot #:[id number] Exp 03/17 Instructions written on box by Naomie Deanarrie Doss, RN Please consider using the E cigarette as we discussed. This could possibly decrease inflammation of the airways  Plain Mucinex (NOT D) for thick secretions ;force NON dairy fluids .   Flonase OR Nasacort AQ 1 spray in each nostril twice a day as needed. Use the "crossover" technique into opposite nostril spraying toward opposite ear @ 45 degree angle, not straight up into nostril. Plain Allegra (NOT D )  160 daily , Loratidine 10 mg , OR Zyrtec 10 mg @ bedtime  as needed for itchy eyes & sneezing.

## 2014-03-04 NOTE — Progress Notes (Signed)
   Subjective:    Patient ID: Amber Griffith, female    DOB: 08/08/1950, 63 y.o.   MRN: [id number]  HPI   Symptoms began 02/25/14 as head congestion; cough with clear sputum; and fever/chills/sweats.  Dust may have been a  trigger.  She used [facility] allergy pills without significant response  She now describes pressure in the frontal and maxillary sinuses without associated purulent secretions. There is itching in her ears  She's noted shortness of breath and wheezing.  She smokes a half-pack a day   Review of Systems   She denies dental pain, sore throat, otic discharge.     Objective:   Physical Exam  Positive significant findings include: She is thin & appears suboptimally nourished Nasal polyps present bilaterally. Nares are dry and mildly inflamed. She has harsh rhonchi in all lung fields without associated increased work of breathing.   No  lymphadenopathy about the head, neck, or axilla noted Eyes: No conjunctival inflammation or lid edema is present. There is no scleral icterus. Ears:  External ear exam shows no significant lesions or deformities.  Otoscopic examination reveals clear canals, tympanic membranes are intact bilaterally without bulging, retraction, inflammation or discharge. Nose:  External nasal examination shows no deformity or inflammation. No septal dislocation or deviation.No obstruction to airflow.  Oral exam: Dental hygiene is good; lips and gums are healthy appearing.There is no oropharyngeal erythema or exudate noted.  Neck:  No deformities, thyromegaly, masses, or tenderness noted.   Supple with full range of motion without pain.  Heart:  Normal rate and regular rhythm. S1 and S2 normal without gallop, murmur, click, rub or other extra sounds.  Extremities:  No cyanosis, edema, or clubbing  noted  Skin: Warm & dry w/o jaundice or tenting.        Assessment & Plan:  #1 acute bronchitis with bronchospasm #2 no associated sinusitis Plan: See  orders and recommendations

## 2014-03-04 NOTE — Progress Notes (Signed)
Pre visit review using our clinic review tool, if applicable. No additional management support is needed unless otherwise documented below in the visit note. 

## 2014-03-06 ENCOUNTER — Telehealth: Payer: Self-pay | Admitting: Internal Medicine

## 2014-03-06 NOTE — Telephone Encounter (Signed)
Relevant patient education assigned to patient using Emmi. ° °

## 2014-06-06 ENCOUNTER — Ambulatory Visit (INDEPENDENT_AMBULATORY_CARE_PROVIDER_SITE_OTHER): Payer: BC Managed Care – PPO | Admitting: Internal Medicine

## 2014-06-06 ENCOUNTER — Ambulatory Visit (INDEPENDENT_AMBULATORY_CARE_PROVIDER_SITE_OTHER)
Admission: RE | Admit: 2014-06-06 | Discharge: 2014-06-06 | Disposition: A | Discharge status: Home / Self Care | Payer: BC Managed Care – PPO | Source: Ambulatory Visit | Attending: Internal Medicine | Admitting: Internal Medicine

## 2014-06-06 ENCOUNTER — Encounter: Payer: Self-pay | Admitting: Internal Medicine

## 2014-06-06 ENCOUNTER — Other Ambulatory Visit: Payer: Self-pay | Admitting: Internal Medicine

## 2014-06-06 ENCOUNTER — Encounter: Payer: Self-pay | Admitting: *Deleted

## 2014-06-06 VITALS — BP 120/70 | HR 72 | Temp 98.8°F | Ht <= 58 in | Wt 88.0 lb

## 2014-06-06 DIAGNOSIS — I10 Essential (primary) hypertension: Secondary | ICD-10-CM

## 2014-06-06 DIAGNOSIS — R05 Cough: Secondary | ICD-10-CM

## 2014-06-06 DIAGNOSIS — J449 Chronic obstructive pulmonary disease, unspecified: Secondary | ICD-10-CM

## 2014-06-06 DIAGNOSIS — R059 Cough, unspecified: Secondary | ICD-10-CM

## 2014-06-06 DIAGNOSIS — R911 Solitary pulmonary nodule: Secondary | ICD-10-CM

## 2014-06-06 DIAGNOSIS — J202 Acute bronchitis due to streptococcus: Secondary | ICD-10-CM | POA: Insufficient documentation

## 2014-06-06 MED ORDER — CEFUROXIME AXETIL 500 MG PO TABS: 500.0000 mg | ORAL_TABLET | Freq: Two times a day (BID) | ORAL | Status: DC

## 2014-06-06 MED ORDER — UMECLIDINIUM-VILANTEROL 62.5-25 MCG/INH IN AEPB: 1.0000 | INHALATION_SPRAY | Freq: Every day | RESPIRATORY_TRACT | Status: DC

## 2014-06-06 MED ORDER — HYDROCOD POLST-CPM POLST ER 10-8 MG PO CP12: 1.0000 | ORAL_CAPSULE | Freq: Two times a day (BID) | ORAL | Status: DC | PRN

## 2014-06-06 NOTE — Progress Notes (Signed)
Pre visit review using our clinic review tool, if applicable. No additional management support is needed unless otherwise documented below in the visit note. 

## 2014-06-06 NOTE — Progress Notes (Signed)
   Subjective:    Patient ID: Amber Griffith, female    DOB: 04/04/1951, 63 y.o.   MRN: [id number]  Cough This is a new problem. The current episode started in the past 7 days. The problem has been gradually worsening. The problem occurs every few hours. The cough is productive of purulent sputum. Associated symptoms include chills, myalgias, nasal congestion, rhinorrhea, a sore throat, shortness of breath and wheezing. Pertinent negatives include no chest pain, ear congestion, ear pain, fever, headaches, heartburn, hemoptysis, postnasal drip, rash, sweats or weight loss. Risk factors for lung disease include smoking/tobacco exposure. She has tried OTC cough suppressant for the symptoms. The treatment provided mild relief. Her past medical history is significant for bronchitis, COPD and pneumonia. There is no history of asthma, bronchiectasis, emphysema or environmental allergies.      Review of Systems  Constitutional: Positive for chills. Negative for fever, weight loss, diaphoresis, activity change, appetite change and fatigue.  HENT: Positive for congestion, rhinorrhea and sore throat. Negative for dental problem, ear pain, nosebleeds, postnasal drip, sinus pressure, sneezing, tinnitus, trouble swallowing and voice change.   Eyes: Negative.   Respiratory: Positive for cough, shortness of breath and wheezing. Negative for hemoptysis, choking, chest tightness and stridor.   Cardiovascular: Negative.  Negative for chest pain, palpitations and leg swelling.  Gastrointestinal: Negative.  Negative for heartburn, nausea, abdominal pain and diarrhea.  Endocrine: Negative.   Genitourinary: Negative.   Musculoskeletal: Positive for myalgias. Negative for arthralgias.  Skin: Negative.  Negative for rash.  Allergic/Immunologic: Negative.  Negative for environmental allergies.  Neurological: Negative.  Negative for headaches.  Hematological: Negative.  Negative for adenopathy. Does not bruise/bleed  easily.  Psychiatric/Behavioral: Negative.        Objective:   Physical Exam  Constitutional: She is oriented to person, place, and time. She appears well-developed and well-nourished.  Non-toxic appearance. She does not have a sickly appearance. She does not appear ill. No distress.  HENT:  Head: Normocephalic and atraumatic.  Mouth/Throat: Oropharynx is clear and moist. No oropharyngeal exudate.  Eyes: Conjunctivae are normal. Right eye exhibits no discharge. Left eye exhibits no discharge. No scleral icterus.  Neck: Normal range of motion. Neck supple. No JVD present. No tracheal deviation present. No thyromegaly present.  Cardiovascular: Normal rate, regular rhythm, normal heart sounds and intact distal pulses.  Exam reveals no gallop and no friction rub.   No murmur heard. Pulmonary/Chest: Effort normal and breath sounds normal. No stridor. No respiratory distress. She has no wheezes. She has no rales. She exhibits no tenderness.  Abdominal: Soft. Bowel sounds are normal. She exhibits no distension and no mass. There is no tenderness. There is no rebound and no guarding.  Musculoskeletal: Normal range of motion. She exhibits no edema or tenderness.  Lymphadenopathy:    She has no cervical adenopathy.  Neurological: She is oriented to person, place, and time.  Skin: Skin is warm and dry. No rash noted. She is not diaphoretic. No erythema. No pallor.  Psychiatric: She has a normal mood and affect. Her behavior is normal. Judgment and thought content normal.  Vitals reviewed.         Assessment & Plan:

## 2014-06-06 NOTE — Patient Instructions (Signed)
Cough, Adult  A cough is a reflex that helps clear your throat and airways. It can help heal the body or may be a reaction to an irritated airway. A cough may only last 2 or 3 weeks (acute) or may last more than 8 weeks (chronic).  CAUSES Acute cough:  Viral or bacterial infections. Chronic cough:  Infections.  Allergies.  Asthma.  Post-nasal drip.  Smoking.  Heartburn or acid reflux.  Some medicines.  Chronic lung problems (COPD).  Cancer. SYMPTOMS   Cough.  Fever.  Chest pain.  Increased breathing rate.  High-pitched whistling sound when breathing (wheezing).  Colored mucus that you cough up (sputum). TREATMENT   A bacterial cough may be treated with antibiotic medicine.  A viral cough must run its course and will not respond to antibiotics.  Your caregiver may recommend other treatments if you have a chronic cough. HOME CARE INSTRUCTIONS   Only take over-the-counter or prescription medicines for pain, discomfort, or fever as directed by your caregiver. Use cough suppressants only as directed by your caregiver.  Use a cold steam vaporizer or humidifier in your bedroom or home to help loosen secretions.  Sleep in a semi-upright position if your cough is worse at night.  Rest as needed.  Stop smoking if you smoke. SEEK IMMEDIATE MEDICAL CARE IF:   You have pus in your sputum.  Your cough starts to worsen.  You cannot control your cough with suppressants and are losing sleep.  You begin coughing up blood.  You have difficulty breathing.  You develop pain which is getting worse or is uncontrolled with medicine.  You have a fever. MAKE SURE YOU:   Understand these instructions.  Will watch your condition.  Will get help right away if you are not doing well or get worse. Document Released: 01/15/2011 Document Revised: 10/11/2011 Document Reviewed: 01/15/2011 Exit[employer] Patient Information 2015 [employer]. This information is not intended  to replace advice given to you by your health care provider. Make sure you discuss any questions you have with your health care provider.  

## 2014-06-07 ENCOUNTER — Telehealth: Payer: Self-pay | Admitting: Internal Medicine

## 2014-06-07 NOTE — Telephone Encounter (Signed)
Patient called stating that Hydrocod Polst-Chlorphen Polst 10-8 MG CP12 is too expensive and she needs a generic alternative. Pt aware Dr. Yetta BarreJones is out of the office today. [facility] [address]

## 2014-06-09 MED ORDER — PROMETHAZINE-DM 6.25-15 MG/5ML PO SYRP: 5.0000 mL | ORAL_SOLUTION | Freq: Four times a day (QID) | ORAL | Status: DC | PRN

## 2014-06-09 NOTE — Assessment & Plan Note (Signed)
Will treat the infection with ceftin, will control the cough with phenergan-dm

## 2014-06-09 NOTE — Telephone Encounter (Signed)
changed

## 2014-06-09 NOTE — Assessment & Plan Note (Signed)
Her CXR is normal.

## 2014-06-09 NOTE — Assessment & Plan Note (Signed)
Normal CXR today

## 2014-06-09 NOTE — Assessment & Plan Note (Signed)
She is using an albuterol inhaler quite a bit so I have asked her to start anoro and to try to decrease the use of her albuterol inhaler - I gave her a sample of this today and showed her how to use it, she demonstrated proficiency with its use She is trying to quit smoking

## 2014-06-09 NOTE — Assessment & Plan Note (Signed)
Her BP is well controlled 

## 2014-06-10 ENCOUNTER — Encounter: Payer: Self-pay | Admitting: *Deleted

## 2014-06-10 ENCOUNTER — Telehealth: Payer: Self-pay | Admitting: Internal Medicine

## 2014-06-10 NOTE — Telephone Encounter (Signed)
ok 

## 2014-06-10 NOTE — Telephone Encounter (Signed)
Pt requesting note that she can return to work on Tuesday Jun 11, 2014 and not Monday November 9th. Pt saw Dr Yetta BarreJones 11/5. Note was written but request change as still under the weather today.

## 2014-06-17 ENCOUNTER — Other Ambulatory Visit: Payer: Self-pay | Admitting: Internal Medicine

## 2014-07-03 ENCOUNTER — Ambulatory Visit: Payer: BC Managed Care – PPO | Admitting: Internal Medicine

## 2014-07-20 ENCOUNTER — Other Ambulatory Visit: Payer: Self-pay | Admitting: Internal Medicine

## 2014-09-06 ENCOUNTER — Other Ambulatory Visit: Payer: BC Managed Care – PPO

## 2014-10-28 ENCOUNTER — Other Ambulatory Visit: Payer: Self-pay | Admitting: Internal Medicine

## 2014-10-28 DIAGNOSIS — Z1231 Encounter for screening mammogram for malignant neoplasm of breast: Secondary | ICD-10-CM

## 2014-11-08 ENCOUNTER — Ambulatory Visit (HOSPITAL_COMMUNITY): Payer: BC Managed Care – PPO

## 2014-11-14 ENCOUNTER — Ambulatory Visit (HOSPITAL_COMMUNITY)
Admission: RE | Admit: 2014-11-14 | Discharge: 2014-11-14 | Disposition: A | Discharge status: Home / Self Care | Payer: BC Managed Care – PPO | Source: Ambulatory Visit | Attending: Internal Medicine | Admitting: Internal Medicine

## 2014-11-14 DIAGNOSIS — Z1231 Encounter for screening mammogram for malignant neoplasm of breast: Secondary | ICD-10-CM

## 2014-11-14 LAB — HM MAMMOGRAPHY: HM Mammogram: NORMAL

## 2015-01-03 ENCOUNTER — Ambulatory Visit: Payer: BC Managed Care – PPO | Admitting: Internal Medicine

## 2015-01-03 DIAGNOSIS — Z0289 Encounter for other administrative examinations: Secondary | ICD-10-CM

## 2015-05-02 ENCOUNTER — Ambulatory Visit (INDEPENDENT_AMBULATORY_CARE_PROVIDER_SITE_OTHER): Payer: BC Managed Care – PPO | Admitting: Internal Medicine

## 2015-05-02 ENCOUNTER — Encounter: Payer: Self-pay | Admitting: Internal Medicine

## 2015-05-02 VITALS — BP 134/60 | HR 77 | Temp 98.3°F | Wt 83.8 lb

## 2015-05-02 DIAGNOSIS — J209 Acute bronchitis, unspecified: Secondary | ICD-10-CM

## 2015-05-02 DIAGNOSIS — J441 Chronic obstructive pulmonary disease with (acute) exacerbation: Secondary | ICD-10-CM | POA: Insufficient documentation

## 2015-05-02 DIAGNOSIS — J449 Chronic obstructive pulmonary disease, unspecified: Secondary | ICD-10-CM

## 2015-05-02 MED ORDER — UMECLIDINIUM-VILANTEROL 62.5-25 MCG/INH IN AEPB: 1.0000 | INHALATION_SPRAY | Freq: Every day | RESPIRATORY_TRACT | Status: DC

## 2015-05-02 MED ORDER — PREDNISONE 20 MG PO TABS: 40.0000 mg | ORAL_TABLET | Freq: Every day | ORAL | Status: DC

## 2015-05-02 MED ORDER — CEFUROXIME AXETIL 500 MG PO TABS: 500.0000 mg | ORAL_TABLET | Freq: Two times a day (BID) | ORAL | Status: AC

## 2015-05-02 MED ORDER — ALBUTEROL SULFATE HFA 108 (90 BASE) MCG/ACT IN AERS: 2.0000 | INHALATION_SPRAY | Freq: Four times a day (QID) | RESPIRATORY_TRACT | Status: DC | PRN

## 2015-05-02 NOTE — Patient Instructions (Addendum)
Medications reviewed and updated  You have an infection and flare of your COPD.   An antibiotic, steroids and your inhalers were sent to your pharmacy.  Please take as directed and contact our office if your symptoms are not improving or worsening.  Increase your rest and fluids.   Work on quitting smoking.  Acute Bronchitis Bronchitis is inflammation of the airways that extend from the windpipe into the lungs (bronchi). The inflammation often causes mucus to develop. This leads to a cough, which is the most common symptom of bronchitis.  In acute bronchitis, the condition usually develops suddenly and goes away over time, usually in a couple weeks. Smoking, allergies, and asthma can make bronchitis worse. Repeated episodes of bronchitis may cause further lung problems.  CAUSES Acute bronchitis is most often caused by the same virus that causes a cold. The virus can spread from person to person (contagious) through coughing, sneezing, and touching contaminated objects. SIGNS AND SYMPTOMS   Cough.   Fever.   Coughing up mucus.   Body aches.   Chest congestion.   Chills.   Shortness of breath.   Sore throat.  DIAGNOSIS  Acute bronchitis is usually diagnosed through a physical exam. Your health care provider will also ask you questions about your medical history. Tests, such as chest X-rays, are sometimes done to rule out other conditions.  TREATMENT  Acute bronchitis usually goes away in a couple weeks. Oftentimes, no medical treatment is necessary. Medicines are sometimes given for relief of fever or cough. Antibiotic medicines are usually not needed but may be prescribed in certain situations. In some cases, an inhaler may be recommended to help reduce shortness of breath and control the cough. A cool mist vaporizer may also be used to help thin bronchial secretions and make it easier to clear the chest.  HOME CARE INSTRUCTIONS  Get plenty of rest.   Drink enough  fluids to keep your urine clear or pale yellow (unless you have a medical condition that requires fluid restriction). Increasing fluids may help thin your respiratory secretions (sputum) and reduce chest congestion, and it will prevent dehydration.   Take medicines only as directed by your health care provider.  If you were prescribed an antibiotic medicine, finish it all even if you start to feel better.  Avoid smoking and secondhand smoke. Exposure to cigarette smoke or irritating chemicals will make bronchitis worse. If you are a smoker, consider using nicotine gum or skin patches to help control withdrawal symptoms. Quitting smoking will help your lungs heal faster.   Reduce the chances of another bout of acute bronchitis by washing your hands frequently, avoiding people with cold symptoms, and trying not to touch your hands to your mouth, nose, or eyes.   Keep all follow-up visits as directed by your health care provider.  SEEK MEDICAL CARE IF: Your symptoms do not improve after 1 week of treatment.  SEEK IMMEDIATE MEDICAL CARE IF:  You develop an increased fever or chills.   You have chest pain.   You have severe shortness of breath.  You have bloody sputum.   You develop dehydration.  You faint or repeatedly feel like you are going to pass out.  You develop repeated vomiting.  You develop a severe headache. MAKE SURE YOU:   Understand these instructions.  Will watch your condition.  Will get help right away if you are not doing well or get worse. Document Released: 08/26/2004 Document Revised: 12/03/2013 Document Reviewed: 01/09/2013 Exit[employer] Patient  Information 2015 [place], [place]. This information is not intended to replace advice given to you by your health care provider. Make sure you discuss any questions you have with your health care provider.

## 2015-05-02 NOTE — Assessment & Plan Note (Signed)
Related to acute bronchitis She is not taking her inhalers and has significant wheezing/rales on expiration that is diffuse.   I have a low suspicion for pneumonia given her diffuse wheeze/rales, good oxygenation and lack of fever, but if there is no improvement or any worsening of her symptoms she will need a cxr Start prednisone 40 mg daily for 5 days Will start ceftin for acute bronchitis - she was last on antibiotics almost one year ago Discussed the importance of starting her inhalers at the first sign of a URI Stressed smoking cessation

## 2015-05-02 NOTE — Progress Notes (Signed)
   Subjective:    Patient ID: Amber Griffith, female    DOB: 1950-09-16, 64 y.o.   MRN: [id number]  HPI She has had cold symptoms for one week and feels they are getting worse.  Her mother died recently and she has been under increased stress.  She has been experiencing laryngitis, productive cough of white/yellow phlegm, wheezing/chest tightness all for one week and diarrhea that just started today.  She denies fever, sob, lightheadedness and headaches.  She is not taking her inhalers regularly.  She has taken them on occasion and is unsure if they have helped.  She took an otc cough syrup.  She does smoke. She has COPD with chronic bronchitis and has had acute bronchitis and pneumonia in the past.     Review of Systems  Constitutional: Negative for fever and appetite change.  HENT: Negative for congestion, ear pain and sore throat.   Respiratory: Positive for chest tightness and wheezing. Negative for shortness of breath.   Cardiovascular: Negative for chest pain and palpitations.  Gastrointestinal: Positive for diarrhea. Negative for nausea, abdominal pain and blood in stool.  Musculoskeletal: Negative for myalgias.  Neurological: Negative for light-headedness and headaches.       Objective:   Physical Exam  Constitutional: She is oriented to person, place, and time. She appears well-developed and well-nourished. No distress.  HENT:  Right Ear: External ear normal.  Left Ear: External ear normal.  Mouth/Throat: Oropharynx is clear and moist. No oropharyngeal exudate.  Eyes: Conjunctivae are normal.  Neck: No thyromegaly present.  Cardiovascular: Normal rate, regular rhythm and normal heart sounds.   No murmur heard. Pulmonary/Chest: Effort normal. No stridor. No respiratory distress. She has wheezes. She has rales.  Diffuse wheeze/rales with expiration that is diffuse.  Wet sounding cough  Musculoskeletal: She exhibits no edema.  Lymphadenopathy:    She has no cervical  adenopathy.  Neurological: She is alert and oriented to person, place, and time.      Filed Vitals:   05/02/15 1049  BP: 134/60  Pulse: 77  Temp: 98.3 F (36.8 C)  TempSrc: Oral  Weight: 83 lb 12 oz (37.989 kg)  SpO2: 99%      Assessment & Plan:

## 2015-05-02 NOTE — Progress Notes (Signed)
Pre visit review using our clinic review tool, if applicable. No additional management support is needed unless otherwise documented below in the visit note. 

## 2015-05-02 NOTE — Assessment & Plan Note (Addendum)
Concern for bacterial component and current flare of her COPD so I will start her on Ceftin, which she has taken before Start prednisone for COPD exacerbation otc cough syrup as needed Increase rest, fluids She knows to call back with any questions or concerns or if her symptoms are not improving  She has not gotten her flu shot, but will get it at work in October

## 2015-05-07 ENCOUNTER — Telehealth: Payer: Self-pay | Admitting: Internal Medicine

## 2015-05-07 NOTE — Telephone Encounter (Signed)
Patient called in to advise that she has seen no change in her condition, if not worse. She is looking for advice on how to proceed.

## 2015-05-07 NOTE — Telephone Encounter (Signed)
Pt needs to come back in

## 2015-05-08 MED ORDER — PROMETHAZINE-DM 6.25-15 MG/5ML PO SYRP: ORAL_SOLUTION | ORAL | Status: DC

## 2015-05-08 NOTE — Telephone Encounter (Signed)
Patient is requesting cough syrup that Dr. Yetta Barre normally prescribes her.  Patient uses [facility] on [address]

## 2015-05-08 NOTE — Telephone Encounter (Signed)
Prescription sent to pharmacy.

## 2015-06-23 ENCOUNTER — Ambulatory Visit (INDEPENDENT_AMBULATORY_CARE_PROVIDER_SITE_OTHER)
Admission: RE | Admit: 2015-06-23 | Discharge: 2015-06-23 | Disposition: A | Discharge status: Home / Self Care | Payer: BC Managed Care – PPO | Source: Ambulatory Visit | Attending: Internal Medicine | Admitting: Internal Medicine

## 2015-06-23 ENCOUNTER — Encounter: Payer: Self-pay | Admitting: Internal Medicine

## 2015-06-23 ENCOUNTER — Ambulatory Visit (INDEPENDENT_AMBULATORY_CARE_PROVIDER_SITE_OTHER): Payer: BC Managed Care – PPO | Admitting: Internal Medicine

## 2015-06-23 VITALS — BP 110/78 | HR 65 | Temp 98.3°F | Resp 20 | Ht <= 58 in | Wt 87.2 lb

## 2015-06-23 DIAGNOSIS — R911 Solitary pulmonary nodule: Secondary | ICD-10-CM | POA: Diagnosis not present

## 2015-06-23 DIAGNOSIS — I517 Cardiomegaly: Secondary | ICD-10-CM | POA: Diagnosis not present

## 2015-06-23 DIAGNOSIS — M545 Low back pain, unspecified: Secondary | ICD-10-CM | POA: Insufficient documentation

## 2015-06-23 DIAGNOSIS — R05 Cough: Secondary | ICD-10-CM

## 2015-06-23 DIAGNOSIS — M4856XA Collapsed vertebra, not elsewhere classified, lumbar region, initial encounter for fracture: Secondary | ICD-10-CM | POA: Insufficient documentation

## 2015-06-23 DIAGNOSIS — J449 Chronic obstructive pulmonary disease, unspecified: Secondary | ICD-10-CM

## 2015-06-23 DIAGNOSIS — G8929 Other chronic pain: Secondary | ICD-10-CM | POA: Insufficient documentation

## 2015-06-23 DIAGNOSIS — M810 Age-related osteoporosis without current pathological fracture: Secondary | ICD-10-CM

## 2015-06-23 DIAGNOSIS — R059 Cough, unspecified: Secondary | ICD-10-CM

## 2015-06-23 DIAGNOSIS — M8448XA Pathological fracture, other site, initial encounter for fracture: Secondary | ICD-10-CM

## 2015-06-23 MED ORDER — METHYLPREDNISOLONE ACETATE 40 MG/ML IJ SUSP
40.0000 mg | Freq: Once | INTRAMUSCULAR | Status: AC
  Administered 2015-06-23: 40 mg via INTRAMUSCULAR

## 2015-06-23 MED ORDER — PROMETHAZINE-DM 6.25-15 MG/5ML PO SYRP: ORAL_SOLUTION | ORAL | Status: DC

## 2015-06-23 MED ORDER — METHYLPREDNISOLONE ACETATE 80 MG/ML IJ SUSP: 80.0000 mg | Freq: Once | INTRAMUSCULAR | Status: DC

## 2015-06-23 MED ORDER — MELOXICAM 15 MG PO TABS: 15.0000 mg | ORAL_TABLET | Freq: Every day | ORAL | Status: DC

## 2015-06-23 NOTE — Patient Instructions (Signed)

## 2015-06-23 NOTE — Progress Notes (Signed)
Subjective:  Patient ID: Amber Griffith, female    DOB: 1951/03/26  Age: 64 y.o. MRN: [id number]  CC: Back Pain and Cough   HPI Amber Griffith presents for one-week history of low back pain and persistent cough. She developed throbbing diffusely in her lower back that does not radiate about a week ago that was not associated with any trauma or injury. The back pain is not associated with any numbness, weakness, tingling in her legs or feet. She also has a persistent cough. She was seen here about 6 or 8 weeks ago and was treated with prednisone, a cough suppressant and ANORO. She has improved to some degree but unfortunately she has not been able to afford ANORO so the cough persists and is occasionally productive of clear to white phlegm.  Outpatient Prescriptions Prior to Visit  Medication Sig Dispense Refill  . albuterol (PROVENTIL HFA;VENTOLIN HFA) 108 (90 BASE) MCG/ACT inhaler Inhale 2 puffs into the lungs every 6 (six) hours as needed for wheezing or shortness of breath. 1 Inhaler 0  . amLODipine (NORVASC) 10 MG tablet TAKE 1 TABLET BY MOUTH EVERY DAY 90 tablet 0  . aspirin 81 MG tablet Take 81 mg by mouth daily.      Marland Kitchen esomeprazole (NEXIUM) 40 MG packet Take 40 mg by mouth as needed. For heartburn    . Umeclidinium-Vilanterol (ANORO ELLIPTA) 62.5-25 MCG/INH AEPB Inhale 1 puff into the lungs daily. 30 each 11  . predniSONE (DELTASONE) 20 MG tablet Take 2 tablets (40 mg total) by mouth daily with breakfast. 10 tablet 0  . promethazine-dextromethorphan (PROMETHAZINE-DM) 6.25-15 MG/5ML syrup TAKE 5 MLS BY MOUTH 4 (FOUR) TIMES DAILY AS NEEDED FOR COUGH. 118 mL 0   No facility-administered medications prior to visit.    ROS Review of Systems  Constitutional: Negative.  Negative for fever, chills, diaphoresis, appetite change and fatigue.  HENT: Negative.   Eyes: Negative.   Respiratory: Positive for cough and shortness of breath. Negative for apnea, choking, chest tightness, wheezing  and stridor.   Cardiovascular: Negative.  Negative for chest pain, palpitations and leg swelling.  Gastrointestinal: Negative.  Negative for nausea, vomiting, abdominal pain, diarrhea, constipation and blood in stool.  Endocrine: Negative.   Genitourinary: Negative.   Musculoskeletal: Positive for back pain. Negative for myalgias, joint swelling, arthralgias, neck pain and neck stiffness.  Skin: Negative.   Allergic/Immunologic: Negative.   Neurological: Negative.  Negative for dizziness, tremors, syncope, weakness, light-headedness and numbness.  Hematological: Negative.   Psychiatric/Behavioral: Negative.     Objective:  BP 110/78 mmHg  Pulse 65  Temp(Src) 98.3 F (36.8 C) (Oral)  Resp 20  Ht  (1.473 m)  Wt 87 lb 4 oz (39.576 kg)  BMI 18.24 kg/m2  SpO2 96%  BP Readings from Last 3 Encounters:  06/23/15 110/78  05/02/15 134/60  06/06/14 120/70    Wt Readings from Last 3 Encounters:  06/23/15 87 lb 4 oz (39.576 kg)  05/02/15 83 lb 12 oz (37.989 kg)  06/06/14 88 lb (39.917 kg)    Physical Exam  Constitutional: She is oriented to person, place, and time. No distress.  HENT:  Head: Normocephalic and atraumatic.  Mouth/Throat: Oropharynx is clear and moist. No oropharyngeal exudate.  Eyes: Conjunctivae are normal. Right eye exhibits no discharge. Left eye exhibits no discharge. No scleral icterus.  Neck: Normal range of motion. Neck supple. No JVD present. No tracheal deviation present. No thyromegaly present.  Cardiovascular: Normal rate, regular rhythm, normal heart  sounds and intact distal pulses.  Exam reveals no gallop and no friction rub.   No murmur heard. Pulmonary/Chest: Effort normal. No stridor. No tachypnea. No respiratory distress. She has no decreased breath sounds. She has no wheezes. She has rhonchi in the right lower field and the left lower field. She has no rales. She exhibits no tenderness.  Abdominal: Soft. Bowel sounds are normal. She exhibits no  distension and no mass. There is no tenderness. There is no rebound and no guarding.  Musculoskeletal: Normal range of motion. She exhibits no edema or tenderness.       Lumbar back: She exhibits bony tenderness. She exhibits normal range of motion, no swelling, no edema, no deformity, no laceration, no pain, no spasm and normal pulse.  Lymphadenopathy:    She has no cervical adenopathy.  Neurological: She is alert and oriented to person, place, and time. She has normal strength. She displays no atrophy, no tremor and normal reflexes. No cranial nerve deficit or sensory deficit. She exhibits normal muscle tone. She displays a negative Romberg sign. She displays no seizure activity. Coordination and gait normal.  Reflex Scores:      Tricep reflexes are 1+ on the right side and 1+ on the left side.      Bicep reflexes are 1+ on the right side and 1+ on the left side.      Brachioradialis reflexes are 1+ on the right side and 1+ on the left side.      Patellar reflexes are 2+ on the right side and 2+ on the left side.      Achilles reflexes are 1+ on the right side and 1+ on the left side. Neg SLR in BLE  Skin: Skin is warm and dry. No rash noted. She is not diaphoretic. No erythema. No pallor.  Psychiatric: She has a normal mood and affect. Her behavior is normal. Judgment and thought content normal.  Vitals reviewed.   Lab Results  Component Value Date   WBC 6.3 07/30/2013   HGB 12.9 07/30/2013   HCT 39.3 07/30/2013   PLT 233.0 07/30/2013   GLUCOSE 78 07/30/2013   CHOL 156 03/01/2012   TRIG 68.0 03/01/2012   HDL 80.30 03/01/2012   LDLCALC 62 03/01/2012   ALT 18 03/01/2012   AST 21 03/01/2012   NA 140 07/30/2013   K 3.6 07/30/2013   CL 106 07/30/2013   CREATININE 0.7 07/30/2013   BUN 13 07/30/2013   CO2 27 07/30/2013   TSH 1.09 07/30/2013   HGBA1C 4.9 07/30/2013    Mm Screening Breast Tomo Bilateral  11/15/2014  CLINICAL DATA:  Screening. EXAM: DIGITAL SCREENING BILATERAL  MAMMOGRAM WITH 3D TOMO WITH CAD COMPARISON:  Previous exam(s). ACR Breast Density Category c: The breast tissue is heterogeneously dense, which may obscure small masses. FINDINGS: There are no findings suspicious for malignancy. Images were processed with CAD. IMPRESSION: No mammographic evidence of malignancy. A result letter of this screening mammogram will be mailed directly to the patient. RECOMMENDATION: Screening mammogram in one year. (Code:SM-B-01Y) BI-RADS CATEGORY  1: Negative. Electronically Signed   By: Harmon PierJeffrey  Hu M.D.   On: 11/15/2014 08:00    Assessment & Plan:   Geri SeminoleRosalyn was seen today for back pain and cough.  Diagnoses and all orders for this visit:  Cough- her CXR is okay, will treat the COPD -     DG Chest 2 View; Future -     promethazine-dextromethorphan (PROMETHAZINE-DM) 6.25-15 MG/5ML syrup; TAKE 5 MLS  BY MOUTH 4 (FOUR) TIMES DAILY AS NEEDED FOR COUGH.  Cardiomegaly  Pulmonary nodule/lesion, solitary- CXR is normal -     DG Chest 2 View; Future  Midline low back pain, with sciatica presence unspecified- plain films are + for compression fracture -     DG Lumbar Spine Complete; Future -     meloxicam (MOBIC) 15 MG tablet; Take 1 tablet (15 mg total) by mouth daily.  COPD (chronic obstructive pulmonary disease) with chronic bronchitis (HCC)- she is having another flareup of this so I gave her another round of steroids this time in the form of a Depo-Medrol injection. Also gave her samples of a ANORO. She will continue to cough suppressant as needed. She was strongly encouraged to stop smoking. -     promethazine-dextromethorphan (PROMETHAZINE-DM) 6.25-15 MG/5ML syrup; TAKE 5 MLS BY MOUTH 4 (FOUR) TIMES DAILY AS NEEDED FOR COUGH. -     Discontinue: methylPREDNISolone acetate (DEPO-MEDROL) injection 80 mg; Inject 1 mL (80 mg total) into the muscle once. -     methylPREDNISolone acetate (DEPO-MEDROL) injection 40 mg; Inject 1 mL (40 mg total) into the muscle  once.  Compression fracture of lumbar spine, non-traumatic, initial encounter- will treat the pain, she Griffith to have a DEXA scan to screen for osteoporosis, will get an MRI to see how severe this -     DG Bone Density; Future  Osteoporosis- DEXA scan ordered, will treat if indicated -     DG Bone Density; Future  I have discontinued Ms. Licklider's predniSONE. I am also having her start on meloxicam. Additionally, I am having her maintain her aspirin, esomeprazole, amLODipine, albuterol, Umeclidinium-Vilanterol, and promethazine-dextromethorphan. We administered methylPREDNISolone acetate.  Meds ordered this encounter  Medications  . promethazine-dextromethorphan (PROMETHAZINE-DM) 6.25-15 MG/5ML syrup    Sig: TAKE 5 MLS BY MOUTH 4 (FOUR) TIMES DAILY AS NEEDED FOR COUGH.    Dispense:  118 mL    Refill:  0  . meloxicam (MOBIC) 15 MG tablet    Sig: Take 1 tablet (15 mg total) by mouth daily.    Dispense:  30 tablet    Refill:  1  . DISCONTD: methylPREDNISolone acetate (DEPO-MEDROL) injection 80 mg    Sig:   . methylPREDNISolone acetate (DEPO-MEDROL) injection 40 mg    Sig:      Follow-up: Return in about 3 weeks (around 07/14/2015).  Sanda Linger, MD

## 2015-06-23 NOTE — Progress Notes (Signed)
Pre visit review using our clinic review tool, if applicable. No additional management support is needed unless otherwise documented below in the visit note. 

## 2015-06-25 ENCOUNTER — Ambulatory Visit (INDEPENDENT_AMBULATORY_CARE_PROVIDER_SITE_OTHER)
Admission: RE | Admit: 2015-06-25 | Discharge: 2015-06-25 | Disposition: A | Discharge status: Home / Self Care | Payer: BC Managed Care – PPO | Source: Ambulatory Visit | Attending: Internal Medicine | Admitting: Internal Medicine

## 2015-06-25 ENCOUNTER — Other Ambulatory Visit: Payer: Self-pay | Admitting: Internal Medicine

## 2015-06-25 DIAGNOSIS — M4856XA Collapsed vertebra, not elsewhere classified, lumbar region, initial encounter for fracture: Secondary | ICD-10-CM

## 2015-06-25 DIAGNOSIS — M8448XA Pathological fracture, other site, initial encounter for fracture: Secondary | ICD-10-CM

## 2015-06-25 DIAGNOSIS — M810 Age-related osteoporosis without current pathological fracture: Secondary | ICD-10-CM

## 2015-06-25 DIAGNOSIS — M545 Low back pain: Secondary | ICD-10-CM

## 2015-06-25 MED ORDER — OXYCODONE HCL 5 MG PO TABS: 5.0000 mg | ORAL_TABLET | Freq: Four times a day (QID) | ORAL | Status: DC | PRN

## 2015-06-30 ENCOUNTER — Other Ambulatory Visit: Payer: Self-pay | Admitting: Internal Medicine

## 2015-06-30 DIAGNOSIS — M4856XS Collapsed vertebra, not elsewhere classified, lumbar region, sequela of fracture: Secondary | ICD-10-CM

## 2015-06-30 DIAGNOSIS — M810 Age-related osteoporosis without current pathological fracture: Secondary | ICD-10-CM

## 2015-07-01 ENCOUNTER — Telehealth: Payer: Self-pay | Admitting: Internal Medicine

## 2015-07-01 NOTE — Telephone Encounter (Signed)
Informed pt of referral that was sent. Pt states she will continue the medicine she was prescribed along with soaking in epson salt until she can get in with endo. Please let her know if there is anything else she should be doing in the mean time.

## 2015-07-04 ENCOUNTER — Other Ambulatory Visit (INDEPENDENT_AMBULATORY_CARE_PROVIDER_SITE_OTHER): Payer: BC Managed Care – PPO | Admitting: *Deleted

## 2015-07-04 ENCOUNTER — Encounter: Payer: Self-pay | Admitting: Internal Medicine

## 2015-07-04 ENCOUNTER — Ambulatory Visit (INDEPENDENT_AMBULATORY_CARE_PROVIDER_SITE_OTHER): Payer: BC Managed Care – PPO | Admitting: Internal Medicine

## 2015-07-04 VITALS — BP 102/62 | HR 77 | Temp 98.2°F | Resp 12 | Ht 59.0 in | Wt 86.8 lb

## 2015-07-04 DIAGNOSIS — Z23 Encounter for immunization: Secondary | ICD-10-CM

## 2015-07-04 DIAGNOSIS — M810 Age-related osteoporosis without current pathological fracture: Secondary | ICD-10-CM

## 2015-07-04 DIAGNOSIS — E559 Vitamin D deficiency, unspecified: Secondary | ICD-10-CM

## 2015-07-04 LAB — COMPREHENSIVE METABOLIC PANEL
ALBUMIN: 4.4 g/dL (ref 3.5–5.2)
ALK PHOS: 85 U/L (ref 39–117)
ALT: 19 U/L (ref 0–35)
AST: 18 U/L (ref 0–37)
BUN: 20 mg/dL (ref 6–23)
CHLORIDE: 106 meq/L (ref 96–112)
CO2: 26 mEq/L (ref 19–32)
Calcium: 9.4 mg/dL (ref 8.4–10.5)
Creatinine, Ser: 0.63 mg/dL (ref 0.40–1.20)
GFR: 122.78 mL/min (ref >60.00)
Glucose, Bld: 97 mg/dL (ref 70–99)
Potassium: 3.5 mEq/L (ref 3.5–5.1)
SODIUM: 141 meq/L (ref 135–145)
TOTAL PROTEIN: 7.2 g/dL (ref 6.0–8.3)
Total Bilirubin: 0.3 mg/dL (ref 0.2–1.2)

## 2015-07-04 LAB — TSH: TSH: 1.01 u[IU]/mL (ref 0.35–4.50)

## 2015-07-04 LAB — VITAMIN D 25 HYDROXY (VIT D DEFICIENCY, FRACTURES): VITD: 12.08 ng/mL — AB (ref 30.00–100.00)

## 2015-07-04 NOTE — Progress Notes (Signed)
Patient ID: Amber Griffith, female   DOB: 03/09/1951, 64 y.o.   MRN: [id number]   HPI  Amber Griffith is a 64 y.o.-year-old female, referred by her PCP, Dr. Yetta BarreJones, for management of osteoporosis.  Pt was dx with OP in 06/2015. No dizziness/vertigo/orthostasis.  I reviewed pt's DEXA scans  Lumbar spine (L1-L4) Femoral neck (FN)  T-score -1.9  RFN: -2.7 LFN: -3.0      She was recently found to have a mild compression of the anterior superior aspect of L4 vertebra of uncertain age on lumbar x-ray during investigation for mid low back pain 1 week (06/23/2015). She tells me she tried to lift a large TV from the floor the day before she started to have the pain. She still has back pain, but improved.   She has not been on OP treatments in the past.  No h/o vitamin D deficiency, but no recent levels available for review.  Pt is not on calcium and vitamin D. She does not eat dairy, but eats green, leafy, vegetables.   No weight bearing exercises.  She walks her dog daily.   She does not take high vitamin A doses.  No h/o hyper/hypocalcemia. No h/o hyperparathyroidism. No h/o kidney stones. Lab Results  Component Value Date   CALCIUM 8.4 07/30/2013   CALCIUM 9.3 03/01/2012   CALCIUM 8.9 09/23/2009   CALCIUM 9.0 01/17/2009   CALCIUM 9.2 11/11/2008   No h/o thyrotoxicosis. Reviewed TSH recent levels:  Lab Results  Component Value Date   TSH 1.09 07/30/2013   TSH 1.82 03/01/2012   TSH 0.63 01/17/2009   No h/o CKD. Last BUN/Cr: Lab Results  Component Value Date   BUN 13 07/30/2013   CREATININE 0.7 07/30/2013   Menopause was at 64 y/o.   Pt does have a FH of osteoporosis: P aunt, uncle.  She has a H/o HTN. . She just retired.  ROS: Constitutional: no weight gain/loss, no fatigue, + subjective hyperthermia/hypothermia, + hot flushes Eyes: no blurry vision, no xerophthalmia ENT: no sore throat, no nodules palpated in throat, no dysphagia/odynophagia, no  hoarseness Cardiovascular: no CP/SOB/palpitations/leg swelling Respiratory: + cough/no SOB/+ wheezing Gastrointestinal: no N/V/D/C/+occas reflux Musculoskeletal: no muscle/joint aches Skin: no rashes Neurological: no tremors/numbness/tingling/dizziness Psychiatric: no depression/anxiety  Past Medical History  Diagnosis Date  . ALLERGIC RHINITIS 09/01/2007  . Cardiomegaly 01/17/2009  . CONSTIPATION, CHRONIC 12/18/2008  . COPD 04/04/2007  . ELECTROCARDIOGRAM, ABNORMAL 01/17/2009  . GERD 04/04/2007  . HEADACHE, TENSION 04/18/2008  . HEMORRHOIDS 09/25/2007  . HYPERTENSION 06/12/2007  . SINUSITIS- ACUTE-NOS 05/08/2010  . TOBACCO ABUSE 04/04/2007  . UTI 09/30/2010  . Vaginitis and vulvovaginitis, unspecified 09/30/2010  . Anxiety and depression 12/24/2010   Past Surgical History  Procedure Laterality Date  . Tonsillectomy    . S/p breast bx      left  . Tubal ligation    . S/p umbilical hernia     Social History   Social History  . Marital Status: Divorced    Spouse Name: N/A  . Number of Children: 1   Occupational History  . custodial [employer]   Social History Main Topics  . Smoking status: Current Every Day Smoker -- 1.00 packs/2 days - trying to quit    Types: Cigarettes  . Smokeless tobacco: Never Used  . Alcohol Use: 0.0 oz/week     Comment: 2 cans per day, beer  . Drug Use: No   Current Outpatient Prescriptions on File Prior to Visit  Medication Sig Dispense Refill  . albuterol (PROVENTIL HFA;VENTOLIN HFA) 108 (90 BASE) MCG/ACT inhaler Inhale 2 puffs into the lungs every 6 (six) hours as needed for wheezing or shortness of breath. 1 Inhaler 0  . amLODipine (NORVASC) 10 MG tablet TAKE 1 TABLET BY MOUTH EVERY DAY 90 tablet 0  . aspirin 81 MG tablet Take 81 mg by mouth daily.      Marland Kitchen esomeprazole (NEXIUM) 40 MG packet Take 40 mg by mouth as needed. For heartburn    . meloxicam (MOBIC) 15 MG tablet Take 1 tablet (15 mg total) by mouth daily. 30 tablet 1  . oxyCODONE  (OXY IR/ROXICODONE) 5 MG immediate release tablet Take 1 tablet (5 mg total) by mouth every 6 (six) hours as needed for severe pain. 90 tablet 0  . promethazine-dextromethorphan (PROMETHAZINE-DM) 6.25-15 MG/5ML syrup TAKE 5 MLS BY MOUTH 4 (FOUR) TIMES DAILY AS NEEDED FOR COUGH. 118 mL 0  . Umeclidinium-Vilanterol (ANORO ELLIPTA) 62.5-25 MCG/INH AEPB Inhale 1 puff into the lungs daily. 30 each 11   No current facility-administered medications on file prior to visit.   No Known Allergies Family History  Problem Relation Age of Onset  . Asthma Mother   . COPD Maternal Uncle   . Hypertension Father   . Stroke Paternal Uncle   . Seizures Father   . Hypertension Mother    PE: BP 102/62 mmHg  Pulse 77  Temp(Src) 98.2 F (36.8 C) (Oral)  Resp 12  Ht  (1.499 m)  Wt 86 lb 12.8 oz (39.372 kg)  BMI 17.52 kg/m2  SpO2 97% Wt Readings from Last 3 Encounters:  07/04/15 86 lb 12.8 oz (39.372 kg)  06/23/15 87 lb 4 oz (39.576 kg)  05/02/15 83 lb 12 oz (37.989 kg)   Constitutional: very thin, in NAD. No kyphosis. Pain at palpation mid-L spine and mid-T spine. Eyes: PERRLA, EOMI, no exophthalmos ENT: moist mucous membranes, no thyromegaly, no cervical lymphadenopathy Cardiovascular: RRR, No MRG Respiratory: CTA B Gastrointestinal: abdomen soft, NT, ND, BS+ Musculoskeletal: no deformities, strength intact in all 4 Skin: moist, warm, no rashes Neurological: no tremor with outstretched hands, DTR normal in all 4  Assessment: 1. Osteoporosis  Plan: 1. Osteoporosis - likely postmenopausal - Discussed about increased risk of fracture, depending on the T score, greatly increased when the T score is lower than -2.5, but it is actually a continuum and -2.5 should not be regarded as an absolute threshold. We reviewed her DEXA scans together, and I explained that based on the T scores, she has an increased risk for fractures.  - We discussed about the different medication classes, benefits and  side effects (including atypical fractures and ONJ - no dental workup in progress or planned).  - I explained that, since she has GERD, she is not a candidate for oral bisphosphonates, so my first choice would be IV bisphosphonate, zoledronic acid (iv Reclast) or sq denosumab (Prolia).I don't think she is a candidate for Teriparatide at the moment. She agrees to start Reclast. Pt was given reading information about Reclast, and I explained the mechanism of action and expected benefits.  - we reviewed her dietary calcium and vitamin D intake, which may be too low. I gave her specific instructions about food sources for these - see pt instructions  - discussed fall precautions   - given handout from [facility] Re: weight bearing exercises - advised to do this every day or at least 5/7 days - advised her to  quit smoking. She is trying - We will check the following tests:  Vitamin D  CMP  TSH - if all normal, will arrange for a Reclast infusion, if decreased GFR, we might need Prolia - will check a new DEXA scan in a year  - will see pt back in a year  - time spent with the patient: 1 hour, of which >50% was spent in obtaining information about her OP, reviewing her DEXA scan report and images, reviewing previous labs, evaluations, and counseling her about her condition (please see the discussed topics above), and developing a plan to further investigate and tx it.  Office Visit on 07/04/2015  Component Date Value Ref Range Status  . TSH 07/04/2015 1.01  0.35 - 4.50 uIU/mL Final  . Sodium 07/04/2015 141  135 - 145 mEq/L Final  . Potassium 07/04/2015 3.5  3.5 - 5.1 mEq/L Final  . Chloride 07/04/2015 106  96 - 112 mEq/L Final  . CO2 07/04/2015 26  19 - 32 mEq/L Final  . Glucose, Bld 07/04/2015 97  70 - 99 mg/dL Final  . BUN 16/05/9603 20  6 - 23 mg/dL Final  . Creatinine, Ser 07/04/2015 0.63  0.40 - 1.20 mg/dL Final  . Total Bilirubin 07/04/2015 0.3  0.2 - 1.2 mg/dL Final   . Alkaline Phosphatase 07/04/2015 85  39 - 117 U/L Final  . AST 07/04/2015 18  0 - 37 U/L Final  . ALT 07/04/2015 19  0 - 35 U/L Final  . Total Protein 07/04/2015 7.2  6.0 - 8.3 g/dL Final  . Albumin 54/04/8118 4.4  3.5 - 5.2 g/dL Final  . Calcium 14/78/2956 9.4  8.4 - 10.5 mg/dL Final  . GFR 21/30/8657 122.78  >60.00 mL/min Final  . VITD 07/04/2015 12.08* 30.00 - 100.00 ng/mL Final   Thyroid test and CMP are all normal. However, her vitamin D is very low, and we will need to repeat this before starting any antiresorptive medications. We'll start ergocalciferol 50,000 units once a week for 10 weeks and come for a vitamin D level in 2 months.

## 2015-07-04 NOTE — Patient Instructions (Signed)
Please stop at the lab.  We will schedule you for Reclast infusion.  How Can I Prevent Falls? Men and women with osteoporosis need to take care not to fall down. Falls can break bones. Some reasons people fall are: Poor vision  Poor balance  Certain diseases that affect how you walk  Some types of medicine, such as sleeping pills.  Some tips to help prevent falls outdoors are: Use a cane or walker  Wear rubber-soled shoes so you don't slip  Walk on grass when sidewalks are slippery  In winter, put salt or kitty litter on icy sidewalks.  Some ways to help prevent falls indoors are: Keep rooms free of clutter, especially on floors  Use plastic or carpet runners on slippery floors  Wear low-heeled shoes that provide good support  Do not walk in socks, stockings, or slippers  Be sure carpets and area rugs have skid-proof backs or are tacked to the floor  Be sure stairs are well lit and have rails on both sides  Put grab bars on bathroom walls near tub, shower, and toilet  Use a rubber bath mat in the shower or tub  Keep a flashlight next to your bed  Use a sturdy step stool with a handrail and wide steps  Add more lights in rooms (and night lights) Buy a cordless phone to keep with you so that you don't have to rush to the phone       when it rings and so that you can call for help if you fall.   (adapted from http://www.niams.NightlifePreviews.se)  Dietary sources of calcium and vitamin D:  Calcium content (mg) - http://www.niams.MoviePins.co.za  Fortified oatmeal, 1 packet 350  Sardines, canned in oil, with edible bones, 3 oz. 324  Cheddar cheese, 1 oz. shredded 306  Milk, nonfat, 1 cup 302  Milkshake, 1 cup 300  Yogurt, plain, low-fat, 1 cup 300  Soybeans, cooked, 1 cup 261  Tofu, firm, with calcium,  cup 204  Orange juice, fortified with calcium, 6 oz. 200-260 (varies)  Salmon, canned, with edible bones, 3 oz. 181   Pudding, instant, made with 2% milk,  cup 153  Baked beans, 1 cup [place], 1% milk fat, 1 cup 138  Spaghetti, lasagna, 1 cup 125  Frozen yogurt, vanilla, soft-serve,  cup 103  Ready-to-eat cereal, fortified with calcium, 1 cup 100-1,000 (varies)  Cheese pizza, 1 slice 161  Fortified waffles, 2 100  Turnip greens, boiled,  cup 99  Broccoli, raw, 1 cup 90  Ice cream, vanilla,  cup 85  Soy or rice milk, fortified with calcium, 1 cup 80-500 (varies)   Vitamin D content (International Units, IU) - https://www.ars.usda.gov Cod liver oil, 1 tablespoon 1,360  Swordfish, cooked, 3 oz 566  Salmon (sockeye), cooked, 3 oz 447  Tuna fish, canned in water, drained, 3 oz 154  Orange juice fortified with vitamin D, 1 cup (check product labels, as amount of added vitamin D varies) 137  Milk, nonfat, reduced fat, and whole, vitamin D-fortified, 1 cup 115-124  Yogurt, fortified with 20% of the daily value for vitamin D, 6 oz 80  Margarine, fortified, 1 tablespoon 60  Sardines, canned in oil, drained, 2 sardines 46  Liver, beef, cooked, 3 oz 42  Egg, 1 large (vitamin D is found in yolk) 41  Ready-to-eat cereal, fortified with 10% of the daily value for vitamin D, 0.75-1 cup  40  Cheese, Swiss, 1 oz 6   Exercise for Strong  Bones (from [place]) There are two types of exercises that are important for building and maintaining bone density:  weight-bearing and muscle-strengthening exercises. Weight-bearing Exercises These exercises include activities that make you move against gravity while staying upright. Weight-bearing exercises can be high-impact or low-impact. High-impact weight-bearing exercises help build bones and keep them strong. If you have broken a bone due to osteoporosis or are at risk of breaking a bone, you may need to avoid high-impact exercises. If you're not sure, you should check with your healthcare provider. Examples of high-impact  weight-bearing exercises are: . Dancing . Doing high-impact aerobics . Hiking . Jogging/running . Jumping Rope . Stair climbing . Tennis Low-impact weight-bearing exercises can also help keep bones strong and are a safe alternative if you cannot do high-impact exercises. Examples of low-impact weight-bearing exercises are: . Using elliptical training machines . Doing low-impact aerobics . Using stair-step machines . Fast walking on a treadmill or outside Muscle-Strengthening Exercises These exercises include activities where you move your body, a weight or some other resistance against gravity. They are also known as resistance exercises and include: . Lifting weights . Using elastic exercise bands . Using weight machines . Lifting your own body weight . Functional movements, such as standing and rising up on your toes Yoga and Pilates can also improve strength, balance and flexibility. However, certain positions may not be safe for people with osteoporosis or those at increased risk of broken bones. For example, exercises that have you bend forward may increase the chance of breaking a bone in the spine. A physical therapist should be able to help you learn which exercises are safe and appropriate for you. Non-Impact Exercises Non-impact exercises can help you to improve balance, posture and how well you move in everyday activities. These exercises can also help to increase muscle strength and decrease the risk of falls and broken bones. Some of these exercises include: . Balance exercises that strengthen your legs and test your balance, such as Tai Chi, can decrease your risk of falls. . Posture exercises that improve your posture and reduce rounded or "sloping" shoulders can help you decrease the chance of breaking a bone, especially in the spine. . Functional exercises that improve how well you move can help you with everyday activities and decrease your chance of falling and breaking a  bone. For example, if you have trouble getting up from a chair or climbing stairs, you should do these activities as exercises. A physical therapist can teach you balance, posture and functional exercises. Starting a New Exercise Program If you haven't exercised regularly for a while, check with your healthcare provider before beginning a new exercise program-particularly if you have health problems such as heart disease, diabetes or high blood pressure. If you're at high risk of breaking a bone, you should work with a physical therapist to develop a safe exercise program. Once you have your healthcare provider's approval, start slowly. If you've already broken bones in the spine because of osteoporosis, be very careful to avoid activities that require reaching down, bending forward, rapid twisting motions, heavy lifting and those that increase your chance of a fall. As you get started, your muscles may feel sore for a day or two after you exercise. If soreness lasts longer, you may be working too hard and need to ease up. Exercises should be done in a pain-free range of motion. How Much Exercise Do You Need? Weight-bearing exercises 30 minutes on most days of the week.  Do a 30-minutesession or multiple sessions spread out throughout the day. The benefits to your bones are the same.   Muscle-strengthening exercises Two to three days per week. If you don't have much time for strengthening/resistance training, do small amounts at a time. You can do just one body part each day. For example do arms one day, legs the next and trunk the next. You can also spread these exercises out during your normal day.  Balance, posture and functional exercises Every day or as often as needed. You may want to focus on one area more than the others. If you have fallen or lose your balance, spend time doing balance exercises. If you are getting rounded shoulders, work more on posture exercises. If you have trouble climbing  stairs or getting up from the couch, do more functional exercises. You can also perform these exercises at one time or spread them during your day. Work with a phyiscal therapist to learn the right exercises for you.    Zoledronic acid: Patient drug information (Up-to-Date) Copyright [phone] [place] rights reserved.  Brand Names: [place]  Reclast;  Zometa What is this drug used for?  .It is used to treat high calcium levels.  .It is used when treating some cancers.  .It is used to treat Paget's disease.  .It is used to put off or treat soft, brittle bones (osteoporosis).  .It may be given to you for other reasons. Talk with the doctor. What do I need to tell my doctor BEFORE I take this drug?  All products:  .If you have an allergy to zoledronic acid or any other part of this drug.  .If you are allergic to any drugs like this one, any other drugs, foods, or other substances. Tell your doctor about the allergy and what signs you had, like rash; hives; itching; shortness of breath; wheezing; cough; swelling of face, lips, tongue, or throat; or any other signs.  Reclast:  .If you have low calcium levels.  .If you have very bad kidney disease.  This is not a list of all drugs or health problems that interact with this drug.  Tell your doctor and pharmacist about all of your drugs (prescription or OTC, natural products, vitamins) and health problems. You must check to make sure that it is safe for you to take this drug with all of your drugs and health problems. Do not start, stop, or change the dose of any drug without checking with your doctor. What are some things I need to know or do while I take this drug?  All products:  .Tell dentists, surgeons, and other doctors that you use this drug.  .Worsening of asthma has happened in people taking drugs like this one. Talk with your doctor.  .This drug may raise the chance of a broken leg. Talk with your doctor.  .Have your blood work  checked often. Talk with your doctor.  .Have a bone density test. Talk with your doctor.  .Have a dental exam before starting this drug.  .Take good care of your teeth. See a dentist often.  .Do not give to a child. Talk with your doctor.  .If you are 38 or older, use this drug with care. You could have more side effects.  .This drug may cause harm to the unborn baby if you take it while you are pregnant.  .Tell your doctor if you are pregnant or plan on getting pregnant. You will need to talk about the benefits and  risks of using this drug while you are pregnant.  .Tell your doctor if you are breast-feeding. You will need to talk about any risks to your baby.  Zometa:  .Take calcium and vitamin D as you were told by your doctor.  Reclast:  .This drug works best when used with calcium/vitamin D and weight-bearing workouts like walking or PT (physical therapy).  .Follow the diet and workout plan that your doctor told you about.  .Use birth control that you can trust to prevent pregnancy while taking this drug. What are some side effects that I need to call my doctor about right away?  WARNING/CAUTION: Even though it may be rare, some people may have very bad and sometimes deadly side effects when taking a drug. Tell your doctor or get medical help right away if you have any of the following signs or symptoms that may be related to a very bad side effect:  .Signs of an allergic reaction, like rash; hives; itching; red, swollen, blistered, or peeling skin with or without fever; wheezing; tightness in the chest or throat; trouble breathing or talking; unusual hoarseness; or swelling of the mouth, face, lips, tongue, or throat.  .Signs of low calcium levels like muscle cramps or spasms, numbness and tingling, or seizures.  .Signs of kidney problems like unable to pass urine, change in the amount of urine passed, blood in the urine, or a big weight gain.  .Very bad bone, joint, or muscle pain.  .Any  new or strange groin, hip, or thigh pain.  .Chest pain.  .A heartbeat that does not feel normal.  .Slow heartbeat.  .Change in eyesight.  .Eye pain.  .Mouth sores.  .Trouble swallowing.  .Very bad pain when swallowing.  .Any bruising or bleeding.  .Pain where the shot was given.  .Redness or swelling where the shot is given.  .This drug may cause jawbone problems. The chance may be higher the longer you take this drug. The chance may be higher if you have cancer, dental problems, dentures that do not fit well, anemia, blood clotting problems, or an infection. The chance may also be higher if you are having dental work or if you are getting chemo, some steroid drugs, or radiation. Call your doctor right away if you have jaw swelling or pain. What are some other side effects of this drug?  All drugs may cause side effects. However, many people have no side effects or only have minor side effects. Call your doctor or get medical help if any of these side effects or any other side effects bother you or do not go away:  All products:  .Dizziness.  Marland KitchenUpset stomach or throwing up.  .Irritation where the shot is given.  .Feeling tired or weak.  .Belly pain.  Marland KitchenHeadache.  .Flu-like signs.  .Loose stools (diarrhea).  .Muscle or joint pain.  .Back pain.  Zometa:  .Not able to sleep.  .Not hungry.  .Hard stools (constipation).  .Weight loss.  .Cough.  These are not all of the side effects that may occur. If you have questions about side effects, call your doctor. Call your doctor for medical advice about side effects.  You may report side effects to your national health agency. How is this drug best taken?  Use this drug as ordered by your doctor. Read and follow the dosing on the label closely.  All products:  .It is given as a shot into a vein over a period of time.  .Drink lots of noncaffeine liquids  unless told to drink less liquid by your doctor.  Reclast:  .Acetaminophen may be  given to lower fever and chills.  .Drink at least 2 glasses of liquids a few hours before you get this drug. What do I do if I miss a dose?  .Call the doctor to find out what to do. How do I store and/or throw out this drug?  Marland KitchenThis drug will be given to you in a hospital or doctor's office. You will not store it at home.  Marland KitchenKeep all drugs out of the reach of children and pets.  .Check with your pharmacist about how to throw out unused drugs.  General drug facts  .If your symptoms or health problems do not get better or if they become worse, call your doctor.  .Do not share your drugs with others and do not take anyone else's drugs.  Marland KitchenKeep a list of all your drugs (prescription, natural products, vitamins, OTC) with you. Give this list to your doctor.  .Talk with the doctor before starting any new drug, including prescription or OTC, natural products, or vitamins.  .Some drugs may have another patient information leaflet. If you have any questions about this drug, please talk with your doctor, pharmacist, or other health care provider.  .If you think there has been an overdose, call your poison control center or get medical care right away. Be ready to tell or show what was taken, how much, and when it happened.

## 2015-07-07 LAB — HM DEXA SCAN: HM Dexa Scan: -3

## 2015-07-07 MED ORDER — VITAMIN D (ERGOCALCIFEROL) 1.25 MG (50000 UNIT) PO CAPS: 50000.0000 [IU] | ORAL_CAPSULE | ORAL | Status: DC

## 2015-07-24 ENCOUNTER — Ambulatory Visit
Admission: RE | Admit: 2015-07-24 | Discharge: 2015-07-24 | Disposition: A | Discharge status: Home / Self Care | Payer: BC Managed Care – PPO | Source: Ambulatory Visit | Attending: Internal Medicine | Admitting: Internal Medicine

## 2015-07-24 DIAGNOSIS — M4856XA Collapsed vertebra, not elsewhere classified, lumbar region, initial encounter for fracture: Secondary | ICD-10-CM

## 2015-07-24 DIAGNOSIS — M545 Low back pain: Secondary | ICD-10-CM

## 2015-07-25 ENCOUNTER — Encounter: Payer: Self-pay | Admitting: Internal Medicine

## 2015-08-11 ENCOUNTER — Other Ambulatory Visit: Payer: Self-pay | Admitting: Internal Medicine

## 2015-08-20 ENCOUNTER — Telehealth: Payer: Self-pay | Admitting: *Deleted

## 2015-08-20 DIAGNOSIS — M545 Low back pain: Secondary | ICD-10-CM

## 2015-08-20 DIAGNOSIS — M4856XA Collapsed vertebra, not elsewhere classified, lumbar region, initial encounter for fracture: Secondary | ICD-10-CM

## 2015-08-20 MED ORDER — OXYCODONE HCL 5 MG PO TABS: 5.0000 mg | ORAL_TABLET | Freq: Four times a day (QID) | ORAL | Status: DC | PRN

## 2015-08-20 NOTE — Telephone Encounter (Signed)
Notified pt rx ready for pick-up.../lmb 

## 2015-08-20 NOTE — Telephone Encounter (Signed)
done

## 2015-08-20 NOTE — Telephone Encounter (Signed)
Received call pt is wanting refill on her Oxycodone...Raechel Chute

## 2015-09-02 ENCOUNTER — Other Ambulatory Visit: Payer: Self-pay | Admitting: Internal Medicine

## 2015-09-05 ENCOUNTER — Telehealth: Payer: Self-pay | Admitting: Internal Medicine

## 2015-09-05 ENCOUNTER — Other Ambulatory Visit (INDEPENDENT_AMBULATORY_CARE_PROVIDER_SITE_OTHER): Payer: BC Managed Care – PPO

## 2015-09-05 DIAGNOSIS — M810 Age-related osteoporosis without current pathological fracture: Secondary | ICD-10-CM | POA: Diagnosis not present

## 2015-09-05 LAB — VITAMIN D 25 HYDROXY (VIT D DEFICIENCY, FRACTURES): VITD: 44.78 ng/mL (ref 30.00–100.00)

## 2015-09-05 NOTE — Telephone Encounter (Signed)
Pt returning call

## 2015-09-08 ENCOUNTER — Other Ambulatory Visit: Payer: Self-pay | Admitting: *Deleted

## 2015-09-08 DIAGNOSIS — E559 Vitamin D deficiency, unspecified: Secondary | ICD-10-CM

## 2015-09-08 NOTE — Telephone Encounter (Signed)
ergocalciterol she is out of already please advise

## 2015-09-08 NOTE — Telephone Encounter (Signed)
Called pt and advised her per our last conversation that she needs to buy the Vitamin D 5000 units OTC and return in 4 mos for labs. Pt voiced understanding.

## 2015-09-09 ENCOUNTER — Other Ambulatory Visit: Payer: Self-pay | Admitting: *Deleted

## 2015-09-09 DIAGNOSIS — E559 Vitamin D deficiency, unspecified: Secondary | ICD-10-CM

## 2016-02-09 ENCOUNTER — Encounter: Payer: Self-pay | Admitting: Family Medicine

## 2016-02-09 ENCOUNTER — Ambulatory Visit (INDEPENDENT_AMBULATORY_CARE_PROVIDER_SITE_OTHER): Payer: BC Managed Care – PPO | Admitting: Family Medicine

## 2016-02-09 VITALS — BP 130/70 | HR 84 | Temp 97.8°F | Resp 14 | Ht 59.0 in | Wt 82.5 lb

## 2016-02-09 DIAGNOSIS — J4489 Other specified chronic obstructive pulmonary disease: Secondary | ICD-10-CM

## 2016-02-09 DIAGNOSIS — R05 Cough: Secondary | ICD-10-CM | POA: Diagnosis not present

## 2016-02-09 DIAGNOSIS — J449 Chronic obstructive pulmonary disease, unspecified: Secondary | ICD-10-CM | POA: Diagnosis not present

## 2016-02-09 DIAGNOSIS — J209 Acute bronchitis, unspecified: Secondary | ICD-10-CM | POA: Diagnosis not present

## 2016-02-09 DIAGNOSIS — R059 Cough, unspecified: Secondary | ICD-10-CM

## 2016-02-09 MED ORDER — AZITHROMYCIN 250 MG PO TABS: ORAL_TABLET | ORAL | Status: DC

## 2016-02-09 MED ORDER — PREDNISONE 10 MG PO TABS: ORAL_TABLET | ORAL | Status: DC

## 2016-02-09 MED ORDER — PROMETHAZINE-DM 6.25-15 MG/5ML PO SYRP: ORAL_SOLUTION | ORAL | Status: DC

## 2016-02-09 NOTE — Progress Notes (Signed)
Subjective:    Patient ID: Amber Griffith, female    DOB: 07/28/1951, 65 y.o.   MRN: [id number]  HPI  Amber Griffith is a 65 year old female who presents today with a cough that has been present for 5 days. Cough is productive of yellow sputum.  Associated symptoms of chills, sore throat, and congestion. She notes mild SOB at times but states that she is not currently SOB during this visit. Treatment at home includes albuterol which she has used 2 times and notes that this has provided moderate benefit.   Denies fever, sweats, N/V/D, GERD, sinus pressure/pain, and ear pain. She smokes a half pack a day No recent antibiotic use in the last 30 days or recent sick contact exposure. Pertinent history of COPD with chronic bronchitis. She was treated in 06/2015 and was previously provided with a sample of ANORO however unfortunately she is unable to afford this medication for use on a regular basis.  She reports a possible trigger for her symptoms as dust   Review of Systems  Constitutional: Positive for chills. Negative for fever.  HENT: Positive for congestion and sore throat. Negative for sinus pressure.   Respiratory: Positive for cough and shortness of breath. Negative for wheezing.   Cardiovascular: Negative for chest pain and palpitations.  Gastrointestinal: Negative for nausea, vomiting and diarrhea.  Skin: Negative for rash.  Neurological: Negative for dizziness, light-headedness and headaches.   Past Medical History  Diagnosis Date  . ALLERGIC RHINITIS 09/01/2007  . Cardiomegaly 01/17/2009  . CONSTIPATION, CHRONIC 12/18/2008  . COPD 04/04/2007  . ELECTROCARDIOGRAM, ABNORMAL 01/17/2009  . GERD 04/04/2007  . HEADACHE, TENSION 04/18/2008  . HEMORRHOIDS 09/25/2007  . HYPERTENSION 06/12/2007  . SINUSITIS- ACUTE-NOS 05/08/2010  . TOBACCO ABUSE 04/04/2007  . UTI 09/30/2010  . Vaginitis and vulvovaginitis, unspecified 09/30/2010  . Anxiety and depression 12/24/2010     Social History   Social  History  . Marital Status: Divorced    Spouse Name: N/A  . Number of Children: 1  . Years of Education: N/A   Occupational History  . custodial [employer]   Social History Main Topics  . Smoking status: Current Every Day Smoker -- 1.00 packs/day for 35 years    Types: Cigarettes  . Smokeless tobacco: Never Used  . Alcohol Use: 0.0 oz/week     Comment: 2 cans per day  . Drug Use: No  . Sexual Activity: Not Currently   Other Topics Concern  . Not on file   Social History Narrative    Past Surgical History  Procedure Laterality Date  . Tonsillectomy    . S/p breast bx      left  . Tubal ligation    . S/p umbilical hernia      Family History  Problem Relation Age of Onset  . Asthma Mother   . COPD Maternal Uncle   . Hypertension Father   . Stroke Paternal Uncle   . Seizures Father   . Hypertension Mother     No Known Allergies  Current Outpatient Prescriptions on File Prior to Visit  Medication Sig Dispense Refill  . albuterol (PROVENTIL HFA;VENTOLIN HFA) 108 (90 BASE) MCG/ACT inhaler Inhale 2 puffs into the lungs every 6 (six) hours as needed for wheezing or shortness of breath. 1 Inhaler 0  . amLODipine (NORVASC) 10 MG tablet TAKE 1 TABLET BY MOUTH EVERY DAY 90 tablet 0  . aspirin 81 MG tablet Take 81 mg by mouth daily.      .Marland Kitchen  esomeprazole (NEXIUM) 40 MG packet Take 40 mg by mouth as needed. For heartburn    . meloxicam (MOBIC) 15 MG tablet TAKE 1 TABLET BY MOUTH EVERY DAY 30 tablet 1  . oxyCODONE (OXY IR/ROXICODONE) 5 MG immediate release tablet Take 1 tablet (5 mg total) by mouth every 6 (six) hours as needed for severe pain. 90 tablet 0  . Umeclidinium-Vilanterol (ANORO ELLIPTA) 62.5-25 MCG/INH AEPB Inhale 1 puff into the lungs daily. 30 each 11  . Vitamin D, Ergocalciferol, (DRISDOL) 50000 UNITS CAPS capsule Take 1 capsule (50,000 Units total) by mouth every 7 (seven) days. 10 capsule 0   No current facility-administered medications on file prior to  visit.    BP 130/70 mmHg  Pulse 84  Temp(Src) 97.8 F (36.6 C) (Oral)  Ht  (1.499 m)  Wt 82 lb 8 oz (37.422 kg)  BMI 16.65 kg/m2       Objective:   Physical Exam  Constitutional: She is oriented to person, place, and time.   Positive significant findings include: She is thin & appears suboptimally nourished   HENT:  Right Ear: Tympanic membrane normal.  Left Ear: Tympanic membrane normal.  Nose: Right sinus exhibits no maxillary sinus tenderness and no frontal sinus tenderness. Left sinus exhibits no maxillary sinus tenderness and no frontal sinus tenderness.  Mouth/Throat: No oropharyngeal exudate or posterior oropharyngeal erythema.  Nasal polyps present bilaterally. Nares are dry and mildly inflamed.   Eyes: Pupils are equal, round, and reactive to light. No scleral icterus.  Neck: Neck supple.  Cardiovascular: Normal rate and regular rhythm.   Pulmonary/Chest: Effort normal.  She has scattered wheezing bilaterally without associated increased work of breathing.  Abdominal: Soft. Bowel sounds are normal.  Lymphadenopathy:    She has cervical adenopathy.  Neurological: She is alert and oriented to person, place, and time.  Skin: Skin is warm and dry.      Assessment & Plan:  1. COPD (chronic obstructive pulmonary disease) with chronic bronchitis (HCC) Doubt symptoms are related to pneumonia with her scattered wheezing and no associated increased WOB, good oxygenation, and lack of fever but if there is no improvement or any worsening of her symptoms, she will need a CXR.   - azithromycin (ZITHROMAX) 250 MG tablet; Take 2 tablets today, then one tablet daily for 4 days.  Dispense: 6 tablet; Refill: 0 - predniSONE (DELTASONE) 10 MG tablet; Take 4 tablets by mouth once daily for 2 days, 3 tablets daily for 2 days, 2 tablets daily for 2 days, and one tablet daily for 2 days  Dispense: 20 tablet; Refill: 0  2. Acute bronchitis, unspecified organism Start prednisone  and azithromycin for acute bronchitis Discussed the importance of smoking cessation    3. Cough Advised patient on supportive measures:  Get rest, drink plenty of fluids, and follow up if fever >101, if symptoms worsen or if symptoms are not improved in 3 days with treatment. Patient verbalizes understanding and agreed with plan.  - promethazine-dextromethorphan (PROMETHAZINE-DM) 6.25-15 MG/5ML syrup; TAKE 5 MLS BY MOUTH 4 (FOUR) TIMES DAILY AS NEEDED FOR COUGH.  Dispense: 118 mL; Refill: 0  Roddie Mc, FNP-C

## 2016-02-09 NOTE — Patient Instructions (Addendum)
Please take medication as directed and follow up with Dr. Yetta BarreJones if your symptoms do not improve, worsen, or you develop a fever >101.  It was a pleasure seeing you today, please rest, increase fluids, and follow up with your provider as recommended for management of COPD.

## 2016-02-12 ENCOUNTER — Telehealth: Payer: Self-pay | Admitting: Internal Medicine

## 2016-02-12 NOTE — Telephone Encounter (Signed)
Need to verify if patient is taking oxycodone on a regular basis. If not, I will sent in hycodan for cough to her pharmacy.

## 2016-02-12 NOTE — Telephone Encounter (Signed)
Pt was Amber Griffith on 7/10 and cough med is not [facility]. Pt would like another cough med [facility] [address]

## 2016-02-12 NOTE — Telephone Encounter (Signed)
Called pt, LVM for pt to return my call

## 2016-02-13 NOTE — Telephone Encounter (Signed)
Pt states she only took pain med when she hurt her back back in Jan. Pt does not take anymore pain meds

## 2016-02-13 NOTE — Telephone Encounter (Signed)
I tried to reach pt and no answer.  

## 2016-02-16 NOTE — Telephone Encounter (Signed)
Please advise on cough medication -- approval given new information that has been provided? If so, confirm dosage and frequency.

## 2016-02-17 MED ORDER — HYDROCODONE-HOMATROPINE 5-1.5 MG/5ML PO SYRP: 5.0000 mL | ORAL_SOLUTION | Freq: Three times a day (TID) | ORAL | Status: DC | PRN

## 2016-02-17 NOTE — Telephone Encounter (Signed)
Pharmacy advised pt they could not accept a fax for this medication, due to it being a controlled substance. Pt needs to pick up rx. Pt would like to pick up asap.

## 2016-02-17 NOTE — Telephone Encounter (Signed)
Rx printed & signed. Pt aware & will pick up.

## 2016-02-17 NOTE — Telephone Encounter (Addendum)
Pt following up in cough med rx.  Pt states she needs because she is still coughing and chest is tight.

## 2016-02-17 NOTE — Telephone Encounter (Signed)
In Amber Griffith's absence, per Dr. Kirtland BouchardK, okay to order hycodan syrup since it was confirmed patient is not taking pain medication. Rx printed and faxed to pharmacy. Left voicemail on patient's cell phone to make aware of Rx status.

## 2016-02-17 NOTE — Addendum Note (Signed)
Addended by: Shade FloodJONES, MICHELLE O on: 02/17/2016 09:05 AM   Modules accepted: Orders

## 2016-07-05 ENCOUNTER — Ambulatory Visit: Payer: BC Managed Care – PPO | Admitting: Internal Medicine

## 2016-07-05 DIAGNOSIS — Z0289 Encounter for other administrative examinations: Secondary | ICD-10-CM

## 2016-08-23 ENCOUNTER — Other Ambulatory Visit: Payer: Self-pay | Admitting: Internal Medicine

## 2016-08-30 ENCOUNTER — Ambulatory Visit (INDEPENDENT_AMBULATORY_CARE_PROVIDER_SITE_OTHER)
Admission: RE | Admit: 2016-08-30 | Discharge: 2016-08-30 | Disposition: A | Discharge status: Home / Self Care | Payer: BC Managed Care – PPO | Source: Ambulatory Visit | Attending: Internal Medicine | Admitting: Internal Medicine

## 2016-08-30 ENCOUNTER — Other Ambulatory Visit: Payer: BC Managed Care – PPO

## 2016-08-30 ENCOUNTER — Encounter: Payer: Self-pay | Admitting: Internal Medicine

## 2016-08-30 ENCOUNTER — Ambulatory Visit (INDEPENDENT_AMBULATORY_CARE_PROVIDER_SITE_OTHER): Payer: BC Managed Care – PPO | Admitting: Internal Medicine

## 2016-08-30 VITALS — BP 170/70 | HR 58 | Temp 97.5°F | Resp 16 | Ht 59.0 in | Wt 86.8 lb

## 2016-08-30 DIAGNOSIS — M546 Pain in thoracic spine: Secondary | ICD-10-CM

## 2016-08-30 DIAGNOSIS — M545 Low back pain, unspecified: Secondary | ICD-10-CM

## 2016-08-30 DIAGNOSIS — F17200 Nicotine dependence, unspecified, uncomplicated: Secondary | ICD-10-CM

## 2016-08-30 DIAGNOSIS — I517 Cardiomegaly: Secondary | ICD-10-CM

## 2016-08-30 DIAGNOSIS — G8929 Other chronic pain: Secondary | ICD-10-CM | POA: Insufficient documentation

## 2016-08-30 DIAGNOSIS — Z0000 Encounter for general adult medical examination without abnormal findings: Secondary | ICD-10-CM | POA: Diagnosis not present

## 2016-08-30 DIAGNOSIS — R911 Solitary pulmonary nodule: Secondary | ICD-10-CM

## 2016-08-30 DIAGNOSIS — E559 Vitamin D deficiency, unspecified: Secondary | ICD-10-CM

## 2016-08-30 DIAGNOSIS — M8000XA Age-related osteoporosis with current pathological fracture, unspecified site, initial encounter for fracture: Secondary | ICD-10-CM

## 2016-08-30 DIAGNOSIS — Z23 Encounter for immunization: Secondary | ICD-10-CM | POA: Diagnosis not present

## 2016-08-30 DIAGNOSIS — I10 Essential (primary) hypertension: Secondary | ICD-10-CM

## 2016-08-30 DIAGNOSIS — Z122 Encounter for screening for malignant neoplasm of respiratory organs: Secondary | ICD-10-CM | POA: Insufficient documentation

## 2016-08-30 DIAGNOSIS — J449 Chronic obstructive pulmonary disease, unspecified: Secondary | ICD-10-CM

## 2016-08-30 DIAGNOSIS — Z1231 Encounter for screening mammogram for malignant neoplasm of breast: Secondary | ICD-10-CM

## 2016-08-30 DIAGNOSIS — Z124 Encounter for screening for malignant neoplasm of cervix: Secondary | ICD-10-CM | POA: Insufficient documentation

## 2016-08-30 DIAGNOSIS — R918 Other nonspecific abnormal finding of lung field: Secondary | ICD-10-CM

## 2016-08-30 MED ORDER — CHLORTHALIDONE 25 MG PO TABS: 25.0000 mg | ORAL_TABLET | Freq: Every day | ORAL | 1 refills | Status: DC

## 2016-08-30 MED ORDER — UMECLIDINIUM-VILANTEROL 62.5-25 MCG/INH IN AEPB: 1.0000 | INHALATION_SPRAY | Freq: Every day | RESPIRATORY_TRACT | 11 refills | Status: DC

## 2016-08-30 MED ORDER — VITAMIN D (ERGOCALCIFEROL) 1.25 MG (50000 UNIT) PO CAPS: 50000.0000 [IU] | ORAL_CAPSULE | ORAL | 5 refills | Status: DC

## 2016-08-30 NOTE — Progress Notes (Signed)
Pre visit review using our clinic review tool, if applicable. No additional management support is needed unless otherwise documented below in the visit note. 

## 2016-08-30 NOTE — Patient Instructions (Signed)
Hypertension Hypertension, commonly called high blood pressure, is when the force of blood pumping through your arteries is too strong. Your arteries are the blood vessels that carry blood from your heart throughout your body. A blood pressure reading consists of a higher number over a lower number, such as 110/72. The higher number (systolic) is the pressure inside your arteries when your heart pumps. The lower number (diastolic) is the pressure inside your arteries when your heart relaxes. Ideally you want your blood pressure below 120/80. Hypertension forces your heart to work harder to pump blood. Your arteries may become narrow or stiff. Having untreated or uncontrolled hypertension can cause heart attack, stroke, kidney disease, and other problems. What increases the risk? Some risk factors for high blood pressure are controllable. Others are not. Risk factors you cannot control include:  Race. You may be at higher risk if you are African American.  Age. Risk increases with age.  Gender. Men are at higher risk than women before age 45 years. After age 65, women are at higher risk than men. Risk factors you can control include:  Not getting enough exercise or physical activity.  Being overweight.  Getting too much fat, sugar, calories, or salt in your diet.  Drinking too much alcohol. What are the signs or symptoms? Hypertension does not usually cause signs or symptoms. Extremely high blood pressure (hypertensive crisis) may cause headache, anxiety, shortness of breath, and nosebleed. How is this diagnosed? To check if you have hypertension, your health care provider will measure your blood pressure while you are seated, with your arm held at the level of your heart. It should be measured at least twice using the same arm. Certain conditions can cause a difference in blood pressure between your right and left arms. A blood pressure reading that is higher than normal on one occasion does  not mean that you need treatment. If it is not clear whether you have high blood pressure, you may be asked to return on a different day to have your blood pressure checked again. Or, you may be asked to monitor your blood pressure at home for 1 or more weeks. How is this treated? Treating high blood pressure includes making lifestyle changes and possibly taking medicine. Living a healthy lifestyle can help lower high blood pressure. You may need to change some of your habits. Lifestyle changes may include:  Following the DASH diet. This diet is high in fruits, vegetables, and whole grains. It is low in salt, red meat, and added sugars.  Keep your sodium intake below 2,300 mg per day.  Getting at least 30-45 minutes of aerobic exercise at least 4 times per week.  Losing weight if necessary.  Not smoking.  Limiting alcoholic beverages.  Learning ways to reduce stress. Your health care provider may prescribe medicine if lifestyle changes are not enough to get your blood pressure under control, and if one of the following is true:  You are 18-59 years of age and your systolic blood pressure is above 140.  You are 60 years of age or older, and your systolic blood pressure is above 150.  Your diastolic blood pressure is above 90.  You have diabetes, and your systolic blood pressure is over 140 or your diastolic blood pressure is over 90.  You have kidney disease and your blood pressure is above 140/90.  You have heart disease and your blood pressure is above 140/90. Your personal target blood pressure may vary depending on your medical   conditions, your age, and other factors. Follow these instructions at home:  Have your blood pressure rechecked as directed by your health care provider.  Take medicines only as directed by your health care provider. Follow the directions carefully. Blood pressure medicines must be taken as prescribed. The medicine does not work as well when you skip  doses. Skipping doses also puts you at risk for problems.  Do not smoke.  Monitor your blood pressure at home as directed by your health care provider. Contact a health care provider if:  You think you are having a reaction to medicines taken.  You have recurrent headaches or feel dizzy.  You have swelling in your ankles.  You have trouble with your vision. Get help right away if:  You develop a severe headache or confusion.  You have unusual weakness, numbness, or feel faint.  You have severe chest or abdominal pain.  You vomit repeatedly.  You have trouble breathing. This information is not intended to replace advice given to you by your health care provider. Make sure you discuss any questions you have with your health care provider. Document Released: 07/19/2005 Document Revised: 12/25/2015 Document Reviewed: 05/11/2013 Elsevier Interactive Patient Education  2017 [employer]  

## 2016-08-30 NOTE — Progress Notes (Signed)
Subjective:  Patient ID: Amber Griffith, female    DOB: 1951/02/11  Age: 66 y.o. MRN: [id number]  CC: Hypertension; Back Pain; and Annual Exam   HPI Amber Griffith presents for a follow-up on hypertension and complains about back pain. She tells me that she ran out of amlodipine and couple weeks ago and since then her blood pressure has not been well controlled. She has had a few headaches but she denies blurred vision, nausea, vomiting, chest pain, shortness of breath, or edema.  She also complains of new onset pain in her thoracic spine between the shoulder blades for about 2 months. She has chronic low back pain from her prior lumbar vertebral fracture. She tells me the low back pain is improving. The pain in the thoracic area is described by her as an intermittent dull ache. The pain does not radiate towards her arms and legs and she denies numbness, weakness, tingling in her extremities.  Outpatient Medications Prior to Visit  Medication Sig Dispense Refill  . aspirin 81 MG tablet Take 81 mg by mouth daily.      Marland Kitchen amLODipine (NORVASC) 10 MG tablet TAKE 1 TABLET BY MOUTH EVERY DAY 90 tablet 0  . albuterol (PROVENTIL HFA;VENTOLIN HFA) 108 (90 BASE) MCG/ACT inhaler Inhale 2 puffs into the lungs every 6 (six) hours as needed for wheezing or shortness of breath. (Patient not taking: Reported on 08/30/2016) 1 Inhaler 0  . esomeprazole (NEXIUM) 40 MG packet Take 40 mg by mouth as needed. For heartburn    . azithromycin (ZITHROMAX) 250 MG tablet Take 2 tablets today, then one tablet daily for 4 days. 6 tablet 0  . HYDROcodone-homatropine (HYCODAN) 5-1.5 MG/5ML syrup Take 5 mLs by mouth every 8 (eight) hours as needed for cough. 120 mL 0  . meloxicam (MOBIC) 15 MG tablet TAKE 1 TABLET BY MOUTH EVERY DAY (Patient not taking: Reported on 08/30/2016) 30 tablet 1  . oxyCODONE (OXY IR/ROXICODONE) 5 MG immediate release tablet Take 1 tablet (5 mg total) by mouth every 6 (six) hours as needed for severe  pain. (Patient not taking: Reported on 08/30/2016) 90 tablet 0  . predniSONE (DELTASONE) 10 MG tablet Take 4 tablets by mouth once daily for 2 days, 3 tablets daily for 2 days, 2 tablets daily for 2 days, and one tablet daily for 2 days 20 tablet 0  . promethazine-dextromethorphan (PROMETHAZINE-DM) 6.25-15 MG/5ML syrup TAKE 5 MLS BY MOUTH 4 (FOUR) TIMES DAILY AS NEEDED FOR COUGH. 118 mL 0  . Umeclidinium-Vilanterol (ANORO ELLIPTA) 62.5-25 MCG/INH AEPB Inhale 1 puff into the lungs daily. (Patient not taking: Reported on 08/30/2016) 30 each 11  . Vitamin D, Ergocalciferol, (DRISDOL) 50000 UNITS CAPS capsule Take 1 capsule (50,000 Units total) by mouth every 7 (seven) days. (Patient not taking: Reported on 08/30/2016) 10 capsule 0   No facility-administered medications prior to visit.     ROS Review of Systems  Constitutional: Negative for appetite change, diaphoresis, fatigue and unexpected weight change.  HENT: Negative.   Eyes: Negative for visual disturbance.  Respiratory: Positive for wheezing. Negative for cough, chest tightness and shortness of breath.   Cardiovascular: Negative for chest pain, palpitations and leg swelling.  Gastrointestinal: Negative for abdominal pain, constipation, diarrhea, nausea and vomiting.  Endocrine: Negative.   Genitourinary: Negative.  Negative for difficulty urinating, flank pain and hematuria.  Musculoskeletal: Positive for back pain. Negative for joint swelling, myalgias and neck pain.  Skin: Negative.   Allergic/Immunologic: Negative.   Neurological: Positive  for headaches. Negative for dizziness, syncope, weakness, light-headedness and numbness.  Hematological: Negative.  Negative for adenopathy. Does not bruise/bleed easily.  Psychiatric/Behavioral: Negative.     Objective:  BP (!) 170/70 (BP Location: Left Arm, Patient Position: Sitting, Cuff Size: Normal)   Pulse (!) 58   Temp 97.5 F (36.4 C) (Oral)   Resp 16   Ht 4\' 11"  (1.499 m)   Wt 86 lb  12 oz (39.3 kg)   SpO2 97%   BMI 17.52 kg/m   BP Readings from Last 3 Encounters:  08/30/16 (!) 170/70  02/09/16 130/70  07/04/15 102/62    Wt Readings from Last 3 Encounters:  08/30/16 86 lb 12 oz (39.3 kg)  02/09/16 82 lb 8 oz (37.4 kg)  07/04/15 86 lb 12.8 oz (39.4 kg)    Physical Exam  Constitutional: She is oriented to person, place, and time. No distress.  HENT:  Mouth/Throat: Oropharynx is clear and moist. No oropharyngeal exudate.  Eyes: Right eye exhibits no discharge. Left eye exhibits no discharge. No scleral icterus.  Neck: Normal range of motion. Neck supple. No JVD present. No tracheal deviation present. No thyromegaly present.  Cardiovascular: Normal rate, regular rhythm, normal heart sounds and intact distal pulses.  Exam reveals no gallop and no friction rub.   No murmur heard. EKG- Sinus  Bradycardia  -With rate variation  cv = 10. Low voltage in limb leads.   -Left atrial enlargement.   -  Negative precordial T-waves.   ABNORMAL   Pulmonary/Chest: Effort normal. No accessory muscle usage or stridor. No tachypnea. No respiratory distress. She has decreased breath sounds in the right upper field, the right middle field, the right lower field, the left upper field, the left middle field and the left lower field. She has wheezes in the right middle field, the right lower field and the left lower field. She has rhonchi in the right middle field, the right lower field and the left lower field. She has no rales.  Mild, bilateral, late inspiratory wheezes and rhonchi  Abdominal: Soft. Bowel sounds are normal. She exhibits no distension and no mass. There is no tenderness. There is no rebound and no guarding.  Musculoskeletal: Normal range of motion. She exhibits no edema, tenderness or deformity.       Thoracic back: Normal. She exhibits normal range of motion, no tenderness, no bony tenderness, no swelling, no edema, no deformity, no pain and no spasm.       Lumbar  back: Normal. She exhibits normal range of motion, no tenderness and no bony tenderness.  Lymphadenopathy:    She has no cervical adenopathy.  Neurological: She is oriented to person, place, and time.  Skin: Skin is warm and dry. No rash noted. She is not diaphoretic. No erythema. No pallor.  Vitals reviewed.   Lab Results  Component Value Date   WBC 6.2 08/31/2016   HGB 12.4 08/31/2016   HCT 37.4 08/31/2016   PLT 233.0 08/31/2016   GLUCOSE 93 08/31/2016   CHOL 162 08/31/2016   TRIG 66.0 08/31/2016   HDL 65.20 08/31/2016   LDLCALC 83 08/31/2016   ALT 15 08/31/2016   AST 15 08/31/2016   NA 143 08/31/2016   K 3.9 08/31/2016   CL 110 08/31/2016   CREATININE 0.68 08/31/2016   BUN 12 08/31/2016   CO2 29 08/31/2016   TSH 0.64 08/31/2016   HGBA1C 4.9 07/30/2013    Mr Lumbar Spine Wo Contrast  Result Date: 07/24/2015 CLINICAL DATA:  Midline low back pain for 1 month since lifting a heavy television. Compression fracture. No reported radicular complaints. EXAM: MRI LUMBAR SPINE WITHOUT CONTRAST TECHNIQUE: Multiplanar, multisequence MR imaging of the lumbar spine was performed. No intravenous contrast was administered. COMPARISON:  Lumbar spine radiographs 06/23/2015 FINDINGS: Vertebral alignment is normal. As seen on prior radiographs, there is a mild L4 superior endplate compression fracture with 15-20% height loss. There is mild edema along the superior endplate. No retropulsion. Lumbar vertebral body heights are preserved elsewhere. Intervertebral discs are well hydrated and maintained in height. The conus medullaris is normal in signal and terminates at L1. There is mild disc bulging at L3-4 without resultant stenosis. No disc herniation elsewhere. IMPRESSION: Mild, subacute L4 compression fracture.  No stenosis. Electronically Signed   By: Sebastian Ache M.D.   On: 07/24/2015 14:44    Assessment & Plan:   Micaila was seen today for hypertension, back pain and annual  exam.  Diagnoses and all orders for this visit:  Essential hypertension- her blood pressure is not adequately well controlled, I've asked her to start taking a thiazide diuretic. Her EKG is negative for signs of ischemia or LVH. Her labs are negative for any secondary causes or end organ damage. -     EKG 12-Lead -     chlorthalidone (HYGROTON) 25 MG tablet; Take 1 tablet (25 mg total) by mouth daily.  Cardiomegaly- she has a normal volume status today but Griffith better blood pressure control -     chlorthalidone (HYGROTON) 25 MG tablet; Take 1 tablet (25 mg total) by mouth daily.  Pulmonary nodule/lesion, solitary  Cervical cancer screening -     Ambulatory referral to Gynecology  TOBACCO ABUSE  Visit for screening mammogram -     MM DIGITAL SCREENING BILATERAL; Future  Routine general medical examination at a health care facility- exam completed, labs ordered and reviewed, vaccines reviewed and updated, she wants to see a gynecologist for her Pap smear, mammogram was ordered, her colonoscopy is up-to-date, patient education material was given. -     Lipid panel; Future -     Comprehensive metabolic panel; Future -     CBC with Differential/Platelet; Future -     TSH; Future -     Urinalysis, Routine w reflex microscopic; Future  COPD (chronic obstructive pulmonary disease) with chronic bronchitis (HCC) -     umeclidinium-vilanterol (ANORO ELLIPTA) 62.5-25 MCG/INH AEPB; Inhale 1 puff into the lungs daily.  Age-related osteoporosis with current pathological fracture, initial encounter- he is due for bone density scan, her vitamin D level is low so I've asked her to start taking a vitamin D supplementation. -     Vitamin D, Ergocalciferol, (DRISDOL) 50000 units CAPS capsule; Take 1 capsule (50,000 Units total) by mouth every 7 (seven) days. -     VITAMIN D 25 Hydroxy (Vit-D Deficiency, Fractures); Future -     DG Bone Density; Future -     Cholecalciferol 2000 units TABS; Take 1  tablet (2,000 Units total) by mouth daily.  Chronic midline thoracic back pain- plain films of the thoracic spine are normal however there is a mass in her lung that could be causing the pain. A CT scan has been ordered, she is high risk for lung cancer. -     DG Thoracic Spine W/Swimmers; Future  Chronic low back pain without sciatica, unspecified back pain laterality- based on her symptoms, exam, next ray this appears to be stable. -  DG Lumbar Spine Complete; Future  Need for shingles vaccine  Need for immunization against influenza -     Flu Vaccine QUAD 36+ mos IM  Mass of upper lobe of right lung -     Cancel: CT CHEST W CONTRAST; Future -     CT CHEST W CONTRAST; Future  Vitamin D deficiency -     Cholecalciferol 2000 units TABS; Take 1 tablet (2,000 Units total) by mouth daily.   I have discontinued Ms. Saltos's amLODipine, oxyCODONE, meloxicam, azithromycin, promethazine-dextromethorphan, predniSONE, and HYDROcodone-homatropine. I am also having her start on chlorthalidone and Cholecalciferol. Additionally, I am having her maintain her aspirin, esomeprazole, albuterol, umeclidinium-vilanterol, and Vitamin D (Ergocalciferol).  Meds ordered this encounter  Medications  . umeclidinium-vilanterol (ANORO ELLIPTA) 62.5-25 MCG/INH AEPB    Sig: Inhale 1 puff into the lungs daily.    Dispense:  30 each    Refill:  11  . Vitamin D, Ergocalciferol, (DRISDOL) 50000 units CAPS capsule    Sig: Take 1 capsule (50,000 Units total) by mouth every 7 (seven) days.    Dispense:  4 capsule    Refill:  5  . chlorthalidone (HYGROTON) 25 MG tablet    Sig: Take 1 tablet (25 mg total) by mouth daily.    Dispense:  90 tablet    Refill:  1  . Cholecalciferol 2000 units TABS    Sig: Take 1 tablet (2,000 Units total) by mouth daily.    Dispense:  90 tablet    Refill:  3     Follow-up: Return in about 6 weeks (around 10/11/2016).  Sanda Linger, MD

## 2016-08-31 ENCOUNTER — Other Ambulatory Visit (INDEPENDENT_AMBULATORY_CARE_PROVIDER_SITE_OTHER): Payer: BC Managed Care – PPO

## 2016-08-31 ENCOUNTER — Encounter: Payer: Self-pay | Admitting: Internal Medicine

## 2016-08-31 DIAGNOSIS — M8000XA Age-related osteoporosis with current pathological fracture, unspecified site, initial encounter for fracture: Secondary | ICD-10-CM

## 2016-08-31 DIAGNOSIS — E559 Vitamin D deficiency, unspecified: Secondary | ICD-10-CM | POA: Insufficient documentation

## 2016-08-31 DIAGNOSIS — Z0000 Encounter for general adult medical examination without abnormal findings: Secondary | ICD-10-CM

## 2016-08-31 LAB — URINALYSIS, ROUTINE W REFLEX MICROSCOPIC
BILIRUBIN URINE: NEGATIVE
KETONES UR: NEGATIVE
LEUKOCYTES UA: NEGATIVE
Nitrite: NEGATIVE
Specific Gravity, Urine: 1.01 (ref 1.000–1.030)
TOTAL PROTEIN, URINE-UPE24: NEGATIVE
UROBILINOGEN UA: 0.2 (ref 0.0–1.0)
Urine Glucose: NEGATIVE
WBC, UA: NONE SEEN (ref 0–?)
pH: 7 (ref 5.0–8.0)

## 2016-08-31 LAB — COMPREHENSIVE METABOLIC PANEL
ALBUMIN: 4.2 g/dL (ref 3.5–5.2)
ALT: 15 U/L (ref 0–35)
AST: 15 U/L (ref 0–37)
Alkaline Phosphatase: 51 U/L (ref 39–117)
BUN: 12 mg/dL (ref 6–23)
CO2: 29 mEq/L (ref 19–32)
CREATININE: 0.68 mg/dL (ref 0.40–1.20)
Calcium: 8.8 mg/dL (ref 8.4–10.5)
Chloride: 110 mEq/L (ref 96–112)
GFR: 112 mL/min (ref >60.00)
GLUCOSE: 93 mg/dL (ref 70–99)
Potassium: 3.9 mEq/L (ref 3.5–5.1)
SODIUM: 143 meq/L (ref 135–145)
TOTAL PROTEIN: 6.8 g/dL (ref 6.0–8.3)
Total Bilirubin: 0.8 mg/dL (ref 0.2–1.2)

## 2016-08-31 LAB — LIPID PANEL
CHOLESTEROL: 162 mg/dL (ref 0–200)
HDL: 65.2 mg/dL (ref >39.00)
LDL CALC: 83 mg/dL (ref 0–99)
NONHDL: 96.39
Total CHOL/HDL Ratio: 2
Triglycerides: 66 mg/dL (ref 0.0–149.0)
VLDL: 13.2 mg/dL (ref 0.0–40.0)

## 2016-08-31 LAB — CBC WITH DIFFERENTIAL/PLATELET
BASOS ABS: 0.1 10*3/uL (ref 0.0–0.1)
Basophils Relative: 1.3 % (ref 0.0–3.0)
Eosinophils Absolute: 0.1 10*3/uL (ref 0.0–0.7)
Eosinophils Relative: 1.2 % (ref 0.0–5.0)
HEMATOCRIT: 37.4 % (ref 36.0–46.0)
Hemoglobin: 12.4 g/dL (ref 12.0–15.0)
LYMPHS ABS: 2.2 10*3/uL (ref 0.7–4.0)
LYMPHS PCT: 35 % (ref 12.0–46.0)
MCHC: 33.1 g/dL (ref 30.0–36.0)
MCV: 91.1 fl (ref 78.0–100.0)
MONOS PCT: 12.1 % — AB (ref 3.0–12.0)
Monocytes Absolute: 0.7 10*3/uL (ref 0.1–1.0)
Neutro Abs: 3.1 10*3/uL (ref 1.4–7.7)
Neutrophils Relative %: 50.4 % (ref 43.0–77.0)
Platelets: 233 10*3/uL (ref 150.0–400.0)
RBC: 4.11 Mil/uL (ref 3.87–5.11)
RDW: 13.2 % (ref 11.5–15.5)
WBC: 6.2 10*3/uL (ref 4.0–10.5)

## 2016-08-31 LAB — TSH: TSH: 0.64 u[IU]/mL (ref 0.35–4.50)

## 2016-08-31 LAB — VITAMIN D 25 HYDROXY (VIT D DEFICIENCY, FRACTURES): VITD: 17.84 ng/mL — ABNORMAL LOW (ref 30.00–100.00)

## 2016-08-31 MED ORDER — CHOLECALCIFEROL 50 MCG (2000 UT) PO TABS: 1.0000 | ORAL_TABLET | Freq: Every day | ORAL | 3 refills | Status: DC

## 2016-09-01 ENCOUNTER — Telehealth: Payer: Self-pay | Admitting: Emergency Medicine

## 2016-09-01 DIAGNOSIS — M545 Low back pain, unspecified: Secondary | ICD-10-CM

## 2016-09-01 DIAGNOSIS — M546 Pain in thoracic spine: Secondary | ICD-10-CM

## 2016-09-01 DIAGNOSIS — G8929 Other chronic pain: Secondary | ICD-10-CM

## 2016-09-01 NOTE — Telephone Encounter (Signed)
sure

## 2016-09-01 NOTE — Telephone Encounter (Signed)
Pt called and wants to know if someone can call her back about her labs. Please advise thanks.

## 2016-09-01 NOTE — Telephone Encounter (Signed)
Pt informed of results.

## 2016-09-01 NOTE — Telephone Encounter (Signed)
Pt is requesting rx for back brace. Okay to print order?

## 2016-09-02 ENCOUNTER — Other Ambulatory Visit: Payer: Self-pay | Admitting: Internal Medicine

## 2016-09-02 DIAGNOSIS — Z1231 Encounter for screening mammogram for malignant neoplasm of breast: Secondary | ICD-10-CM

## 2016-09-03 ENCOUNTER — Encounter: Payer: Self-pay | Admitting: Emergency Medicine

## 2016-09-03 NOTE — Addendum Note (Signed)
Addended by: Verlan FriendsAIRRIKIER DAVIDSON, Dorian Duval M on: 09/03/2016 04:54 PM   Modules accepted: Orders

## 2016-09-03 NOTE — Telephone Encounter (Signed)
Printed for PCP sign.

## 2016-09-06 ENCOUNTER — Ambulatory Visit
Admission: RE | Admit: 2016-09-06 | Discharge: 2016-09-06 | Disposition: A | Discharge status: Home / Self Care | Payer: BC Managed Care – PPO | Source: Ambulatory Visit | Attending: Internal Medicine | Admitting: Internal Medicine

## 2016-09-06 DIAGNOSIS — Z1231 Encounter for screening mammogram for malignant neoplasm of breast: Secondary | ICD-10-CM

## 2016-09-07 ENCOUNTER — Ambulatory Visit
Admission: RE | Admit: 2016-09-07 | Discharge: 2016-09-07 | Disposition: A | Discharge status: Home / Self Care | Payer: BC Managed Care – PPO | Source: Ambulatory Visit | Attending: Internal Medicine | Admitting: Internal Medicine

## 2016-09-07 DIAGNOSIS — R918 Other nonspecific abnormal finding of lung field: Secondary | ICD-10-CM

## 2016-09-07 LAB — HM MAMMOGRAPHY

## 2016-09-07 MED ORDER — IOPAMIDOL (ISOVUE-300) INJECTION 61%
75.0000 mL | Freq: Once | INTRAVENOUS | Status: AC | PRN
  Administered 2016-09-07: 75 mL via INTRAVENOUS

## 2016-09-07 NOTE — Telephone Encounter (Signed)
Informed pt that rx was ready for pick up. Pt may need to submitted rx to insurance to be reimbursed.

## 2016-09-08 ENCOUNTER — Telehealth: Payer: Self-pay

## 2016-09-08 NOTE — Telephone Encounter (Signed)
Got rescheduled °

## 2016-09-08 NOTE — Telephone Encounter (Signed)
Patient needs to reschedule bone density for another day----routing to tammy, can you please call patient and reschedule?  thanks

## 2016-09-13 ENCOUNTER — Other Ambulatory Visit: Payer: BC Managed Care – PPO

## 2016-09-15 ENCOUNTER — Ambulatory Visit: Payer: Self-pay | Admitting: Gynecology

## 2016-09-20 ENCOUNTER — Telehealth: Payer: Self-pay | Admitting: Internal Medicine

## 2016-09-20 NOTE — Telephone Encounter (Signed)
Left patient vm to check with [facility] to verify coverage before cancelling appt.

## 2016-09-20 NOTE — Telephone Encounter (Signed)
Patient states she can not have bone density done until after 06/30/2015.  States this was when her last bone density was done.  [facility] at 11am.  Please let me know if I need to cancel.

## 2016-09-20 NOTE — Telephone Encounter (Signed)
She had a fracture so there should not be an issue with insurance covering.

## 2016-09-23 ENCOUNTER — Other Ambulatory Visit: Payer: BC Managed Care – PPO

## 2016-09-24 ENCOUNTER — Ambulatory Visit (INDEPENDENT_AMBULATORY_CARE_PROVIDER_SITE_OTHER): Payer: BC Managed Care – PPO | Admitting: Gynecology

## 2016-09-24 ENCOUNTER — Encounter: Payer: Self-pay | Admitting: Gynecology

## 2016-09-24 VITALS — BP 138/78 | Ht 59.0 in | Wt 83.0 lb

## 2016-09-24 DIAGNOSIS — Z01411 Encounter for gynecological examination (general) (routine) with abnormal findings: Secondary | ICD-10-CM | POA: Diagnosis not present

## 2016-09-24 DIAGNOSIS — E559 Vitamin D deficiency, unspecified: Secondary | ICD-10-CM

## 2016-09-24 DIAGNOSIS — F17200 Nicotine dependence, unspecified, uncomplicated: Secondary | ICD-10-CM | POA: Diagnosis not present

## 2016-09-24 DIAGNOSIS — M810 Age-related osteoporosis without current pathological fracture: Secondary | ICD-10-CM

## 2016-09-24 LAB — HM PAP SMEAR

## 2016-09-24 NOTE — Progress Notes (Signed)
Amber Griffith 02/10/1951 [id number]   History:    66 y.o.  for annual gyn exam who was referred to our practice by her PCP Dr. Barkley BoardsJoneses patient has not had a gynecological examination in many years. She states she had a normal Pap smear in 2012. She never been on any hormone replacement therapy in the past. Dr. Lafe GarinGherge has been monitoring her osteoporosis for which patient is on yearly Reclast IV infusion. After reviewing her note patient has history vitamin D deficiency and was to return back to check her vitamin D level but has been quite some time so we will check today. She is not even taking any calcium or vitamin D currently. She does have a low body mass index and is a chronic smoker of a pack cigarette every 2-3 days. She had a CT of the chest in 2017. Her mammogram was normal in February this year. Patient denies any family history of any GYN malignancy. Patient has never been on any hormone replacement therapy. Her PCP has been doing her blood work and her vaccines are up-to-date. Colonoscopy 2013.  Past medical history,surgical history, family history and social history were all reviewed and documented in the EPIC chart.  Gynecologic History No LMP recorded. Patient is postmenopausal. Contraception: post menopausal status Last Pap: 2012. Results were: normal Last mammogram: 2018. Results were: normal  Obstetric History OB History  Gravida Para Term Preterm AB Living  1       1 1   SAB TAB Ectopic Multiple Live Births      1        # Outcome Date GA Lbr Len/2nd Weight Sex Delivery Anes PTL Lv  1 Ectopic                ROS: A ROS was performed and pertinent positives and negatives are included in the history.  GENERAL: No fevers or chills. HEENT: No change in vision, no earache, sore throat or sinus congestion. NECK: No pain or stiffness. CARDIOVASCULAR: No chest pain or pressure. No palpitations. PULMONARY: No shortness of breath, cough or wheeze. GASTROINTESTINAL: No  abdominal pain, nausea, vomiting or diarrhea, melena or bright red blood per rectum. GENITOURINARY: No urinary frequency, urgency, hesitancy or dysuria. MUSCULOSKELETAL: No joint or muscle pain, no back pain, no recent trauma. DERMATOLOGIC: No rash, no itching, no lesions. ENDOCRINE: No polyuria, polydipsia, no heat or cold intolerance. No recent change in weight. HEMATOLOGICAL: No anemia or easy bruising or bleeding. NEUROLOGIC: No headache, seizures, numbness, tingling or weakness. PSYCHIATRIC: No depression, no loss of interest in normal activity or change in sleep pattern.     Exam: chaperone present  BP 138/78   Ht 4\' 11"  (1.499 m)   Wt 83 lb (37.6 kg)   BMI 16.76 kg/m   Body mass index is 16.76 kg/m.  General appearance : Well developed well nourished female. No acute distress HEENT: Eyes: no retinal hemorrhage or exudates,  Neck supple, trachea midline, no carotid bruits, no thyroidmegaly Lungs: Clear to auscultation, no rhonchi or wheezes, or rib retractions  Heart: Regular rate and rhythm, no murmurs or gallops Breast:Examined in sitting and supine position were symmetrical in appearance, no palpable masses or tenderness,  no skin retraction, no nipple inversion, no nipple discharge, no skin discoloration, no axillary or supraclavicular lymphadenopathy Abdomen: no palpable masses or tenderness, no rebound or guarding Extremities: no edema or skin discoloration or tenderness  Pelvic:  Bartholin, Urethra, Skene Glands: Within normal limits  Vagina: No gross lesions or discharge, atrophic changes  Cervix: No gross lesions or discharge  Uterus  retroverted, normal size, shape and consistency, non-tender and mobile  Adnexa  Without masses or tenderness  Anus and perineum  normal   Rectovaginal  normal sphincter tone without palpated masses or tenderness             Hemoccult PCP provides     Assessment/Plan:  66 y.o. female for annual exam with history of  osteoporosis being monitored by endocrinologist patient on yearly IV infusion of Reclast has had history vitamin D deficiency did not follow up instructions by her endocrinologist to recheck her vitamin D level and for this reason we will check her vitamin D level today. We discussed importance of calcium vitamin D and weightbearing exercises for osteoporosis prevention. She was counseled on the detrimental effects of smoking. Pap smear was done today with HPV screening since his been over 6 years since her last Pap smear. Her PCP has been doing her blood work. She is due for her next bone density study in December of this year.    Ok Edwards MD, 12:18 PM 09/24/2016

## 2016-09-24 NOTE — Addendum Note (Signed)
Addended by: Kem ParkinsonBARNES, Ndia Sampath on: 09/24/2016 12:29 PM   Modules accepted: Orders

## 2016-09-24 NOTE — Patient Instructions (Addendum)
Osteoporosis Osteoporosis is the thinning and loss of density in the bones. Osteoporosis makes the bones more brittle, fragile, and likely to break (fracture). Over time, osteoporosis can cause the bones to become so weak that they fracture after a simple fall. The bones most likely to fracture are the bones in the hip, wrist, and spine. What are the causes? The exact cause is not known. What increases the risk? Anyone can develop osteoporosis. You may be at greater risk if you have a family history of the condition or have poor nutrition. You may also have a higher risk if you are:  Female.  64 years old or older.  A smoker.  Not physically active.  White or Asian.  Slender. What are the signs or symptoms? A fracture might be the first sign of the disease, especially if it results from a fall or injury that would not usually cause a bone to break. Other signs and symptoms include:  Low back and neck pain.  Stooped posture. Height loss. Steps to Quit Smoking Smoking tobacco can be harmful to your health and can affect almost every organ in your body. Smoking puts you, and those around you, at risk for developing many serious chronic diseases. Quitting smoking is difficult, but it is one of the best things that you can do for your health. It is never too late to quit. What are the benefits of quitting smoking? When you quit smoking, you lower your risk of developing serious diseases and conditions, such as:  Lung cancer or lung disease, such as COPD.  Heart disease.  Stroke.  Heart attack.  Infertility.  Osteoporosis and bone fractures. Additionally, symptoms such as coughing, wheezing, and shortness of breath may get better when you quit. You may also find that you get sick less often because your body is stronger at fighting off colds and infections. If you are pregnant, quitting smoking can help to reduce your chances of having a baby of low birth weight. How do I get  ready to quit? When you decide to quit smoking, create a plan to make sure that you are successful. Before you quit:  Pick a date to quit. Set a date within the next two weeks to give you time to prepare.  Write down the reasons why you are quitting. Keep this list in places where you will see it often, such as on your bathroom mirror or in your car or wallet.  Identify the people, places, things, and activities that make you want to smoke (triggers) and avoid them. Make sure to take these actions:  Throw away all cigarettes at home, at work, and in your car.  Throw away smoking accessories, such as Set designer.  Clean your car and make sure to empty the ashtray.  Clean your home, including curtains and carpets.  Tell your family, friends, and coworkers that you are quitting. Support from your loved ones can make quitting easier.  Talk with your health care provider about your options for quitting smoking.  Find out what treatment options are covered by your health insurance. What strategies can I use to quit smoking? Talk with your healthcare provider about different strategies to quit smoking. Some strategies include:  Quitting smoking altogether instead of gradually lessening how much you smoke over a period of time. Research shows that quitting "cold [place]" is more successful than gradually quitting.  Attending in-person counseling to help you build problem-solving skills. You are more likely to have success in  quitting if you attend several counseling sessions. Even short sessions of 10 minutes can be effective.  Finding resources and support systems that can help you to quit smoking and remain smoke-free after you quit. These resources are most helpful when you use them often. They can include:  Online chats with a Veterinary surgeoncounselor.  Telephone quitlines.  Printed Materials engineerself-help materials.  Support groups or group counseling.  Text messaging programs.  Mobile phone  applications.  Taking medicines to help you quit smoking. (If you are pregnant or breastfeeding, talk with your health care provider first.) Some medicines contain nicotine and some do not. Both types of medicines help with cravings, but the medicines that include nicotine help to relieve withdrawal symptoms. Your health care provider may recommend:  Nicotine patches, gum, or lozenges.  Nicotine inhalers or sprays.  Non-nicotine medicine that is taken by mouth. Talk with your health care provider about combining strategies, such as taking medicines while you are also receiving in-person counseling. Using these two strategies together makes you more likely to succeed in quitting than if you used either strategy on its own. If you are pregnant or breastfeeding, talk with your health care provider about finding counseling or other support strategies to quit smoking. Do not take medicine to help you quit smoking unless told to do so by your health care provider. What things can I do to make it easier to quit? Quitting smoking might feel overwhelming at first, but there is a lot that you can do to make it easier. Take these important actions:  Reach out to your family and friends and ask that they support and encourage you during this time. Call telephone quitlines, reach out to support groups, or work with a counselor for support.  Ask people who smoke to avoid smoking around you.  Avoid places that trigger you to smoke, such as bars, parties, or smoke-break areas at work.  Spend time around people who do not smoke.  Lessen stress in your life, because stress can be a smoking trigger for some people. To lessen stress, try:  Exercising regularly.  Deep-breathing exercises.  Yoga.  Meditating.  Performing a body scan. This involves closing your eyes, scanning your body from head to toe, and noticing which parts of your body are particularly tense. Purposefully relax the muscles in those  areas.  Download or purchase mobile phone or tablet apps (applications) that can help you stick to your quit plan by providing reminders, tips, and encouragement. There are many free apps, such as QuitGuide from the [employer] developer(Centers for Disease Control and Prevention). You can find other support for quitting smoking (smoking cessation) through smokefree.gov and other websites. How will I feel when I quit smoking? Within the first 24 hours of quitting smoking, you may start to feel some withdrawal symptoms. These symptoms are usually most noticeable 2-3 days after quitting, but they usually do not last beyond 2-3 weeks. Changes or symptoms that you might experience include:  Mood swings.  Restlessness, anxiety, or irritation.  Difficulty concentrating.  Dizziness.  Strong cravings for sugary foods in addition to nicotine.  Mild weight gain.  Constipation.  Nausea.  Coughing or a sore throat.  Changes in how your medicines work in your body.  A depressed mood.  Difficulty sleeping (insomnia). After the first 2-3 weeks of quitting, you may start to notice more positive results, such as:  Improved sense of smell and taste.  Decreased coughing and sore throat.  Slower heart rate.  Lower  blood pressure.  Clearer skin.  The ability to breathe more easily.  Fewer sick days. Quitting smoking is very challenging for most people. Do not get discouraged if you are not successful the first time. Some people need to make many attempts to quit before they achieve long-term success. Do your best to stick to your quit plan, and talk with your health care provider if you have any questions or concerns. This information is not intended to replace advice given to you by your health care provider. Make sure you discuss any questions you have with your health care provider. Document Released: 07/13/2001 Document Revised: 03/16/2016 Document Reviewed: 12/03/2014 Elsevier Interactive Patient  Education  2017 [employer]. How is this diagnosed? To make a diagnosis, your health care provider may:  Take a medical history.  Perform a physical exam.  Order tests, such as:  A bone mineral density test.  A dual-energy X-ray absorptiometry test. How is this treated? The goal of osteoporosis treatment is to strengthen your bones to reduce your risk of a fracture. Treatment may involve:  Making lifestyle changes, such as:  Eating a diet rich in calcium.  Doing weight-bearing and muscle-strengthening exercises.  Stopping tobacco use.  Limiting alcohol intake.  Taking medicine to slow the process of bone loss or to increase bone density.  Monitoring your levels of calcium and vitamin D. Follow these instructions at home:  Include calcium and vitamin D in your diet. Calcium is important for bone health, and vitamin D helps the body absorb calcium.  Perform weight-bearing and muscle-strengthening exercises as directed by your health care provider.  Do not use any tobacco products, including cigarettes, chewing tobacco, and electronic cigarettes. If you need help quitting, ask your health care provider.  Limit your alcohol intake.  Take medicines only as directed by your health care provider.  Keep all follow-up visits as directed by your health care provider. This is important.  Take precautions at home to lower your risk of falling, such as:  Keeping rooms well lit and clutter free.  Installing safety rails on stairs.  Using rubber mats in the bathroom and other areas that are often wet or slippery. Get help right away if: You fall or injure yourself. This information is not intended to replace advice given to you by your health care provider. Make sure you discuss any questions you have with your health care provider. Document Released: 04/28/2005 Document Revised: 12/22/2015 Document Reviewed: 12/27/2013 Elsevier Interactive Patient Education  2017 [employer].

## 2016-09-25 LAB — VITAMIN D 25 HYDROXY (VIT D DEFICIENCY, FRACTURES): Vit D, 25-Hydroxy: 49 ng/mL (ref 30–100)

## 2016-09-27 LAB — PAP IG W/ RFLX HPV ASCU

## 2016-12-15 ENCOUNTER — Encounter: Payer: Self-pay | Admitting: Gynecology

## 2017-03-21 ENCOUNTER — Encounter: Payer: Self-pay | Admitting: Internal Medicine

## 2017-03-21 ENCOUNTER — Ambulatory Visit (INDEPENDENT_AMBULATORY_CARE_PROVIDER_SITE_OTHER): Payer: BC Managed Care – PPO | Admitting: Internal Medicine

## 2017-03-21 VITALS — BP 124/70 | HR 80 | Temp 98.8°F | Resp 16 | Ht 59.0 in | Wt 80.2 lb

## 2017-03-21 DIAGNOSIS — J449 Chronic obstructive pulmonary disease, unspecified: Secondary | ICD-10-CM | POA: Diagnosis not present

## 2017-03-21 DIAGNOSIS — J988 Other specified respiratory disorders: Secondary | ICD-10-CM | POA: Diagnosis not present

## 2017-03-21 MED ORDER — PROMETHAZINE-DM 6.25-15 MG/5ML PO SYRP: 5.0000 mL | ORAL_SOLUTION | Freq: Four times a day (QID) | ORAL | 0 refills | Status: AC | PRN

## 2017-03-21 MED ORDER — CEFDINIR 300 MG PO CAPS: 300.0000 mg | ORAL_CAPSULE | Freq: Two times a day (BID) | ORAL | 0 refills | Status: AC

## 2017-03-21 NOTE — Progress Notes (Signed)
Subjective:  Patient ID: Amber Griffith, female    DOB: 04-08-1951  Age: 66 y.o. MRN: [id number]  CC: Cough   HPI Amber Griffith presents for a 2 day history of cough that's productive of thick yellow phlegm with shortness of breath and chills. She also complains of myalgias. She's had no fever, hemoptysis, or chest pain. She is getting some symptom relief with Anoro.  Outpatient Medications Prior to Visit  Medication Sig Dispense Refill  . aspirin 81 MG tablet Take 81 mg by mouth daily.      . chlorthalidone (HYGROTON) 25 MG tablet Take 1 tablet (25 mg total) by mouth daily. 90 tablet 1  . esomeprazole (NEXIUM) 40 MG packet Take 40 mg by mouth as needed. For heartburn    . umeclidinium-vilanterol (ANORO ELLIPTA) 62.5-25 MCG/INH AEPB Inhale 1 puff into the lungs daily. 30 each 11  . Vitamin D, Ergocalciferol, (DRISDOL) 50000 units CAPS capsule Take 1 capsule (50,000 Units total) by mouth every 7 (seven) days. 4 capsule 5   No facility-administered medications prior to visit.     ROS Review of Systems  Constitutional: Positive for chills. Negative for appetite change, fatigue, fever and unexpected weight change.  HENT: Negative.  Negative for facial swelling, sinus pressure and trouble swallowing.   Eyes: Negative for visual disturbance.  Respiratory: Positive for cough and shortness of breath. Negative for chest tightness and wheezing.   Cardiovascular: Negative for chest pain.  Gastrointestinal: Negative for abdominal pain, diarrhea, nausea and vomiting.  Endocrine: Negative.   Genitourinary: Negative.  Negative for difficulty urinating.  Musculoskeletal: Negative.  Negative for back pain and myalgias.  Skin: Negative.  Negative for color change and rash.  Allergic/Immunologic: Negative.   Neurological: Negative.  Negative for dizziness, weakness and numbness.  Hematological: Negative for adenopathy. Does not bruise/bleed easily.  Psychiatric/Behavioral: Negative.      Objective:  BP 124/70 (BP Location: Left Arm, Patient Position: Sitting, Cuff Size: Normal)   Pulse 80   Temp 98.8 F (37.1 C) (Oral)   Resp 16   Ht 4\' 11"  (1.499 m)   Wt 80 lb 4 oz (36.4 kg)   SpO2 98%   BMI 16.21 kg/m   BP Readings from Last 3 Encounters:  03/21/17 124/70  09/24/16 138/78  08/30/16 (!) 170/70    Wt Readings from Last 3 Encounters:  03/21/17 80 lb 4 oz (36.4 kg)  09/24/16 83 lb (37.6 kg)  08/30/16 86 lb 12 oz (39.3 kg)    Physical Exam  Constitutional: She is oriented to person, place, and time.  Non-toxic appearance. She does not have a sickly appearance. She does not appear ill. No distress.  HENT:  Mouth/Throat: Oropharynx is clear and moist. No oropharyngeal exudate.  Eyes: Conjunctivae are normal. Right eye exhibits no discharge. Left eye exhibits no discharge. No scleral icterus.  Neck: Normal range of motion. Neck supple. No JVD present. No thyromegaly present.  Cardiovascular: Normal rate, regular rhythm and intact distal pulses.  Exam reveals no gallop and no friction rub.   No murmur heard. Pulmonary/Chest: Effort normal. No accessory muscle usage. No tachypnea. No respiratory distress. She has no decreased breath sounds. She has no wheezes. She has rhonchi in the right lower field and the left lower field. She has no rales. She exhibits no tenderness.  Abdominal: Soft. Bowel sounds are normal. She exhibits no distension and no mass. There is no tenderness. There is no rebound and no guarding.  Musculoskeletal: Normal range of  motion. She exhibits no edema, tenderness or deformity.  Lymphadenopathy:    She has no cervical adenopathy.  Neurological: She is alert and oriented to person, place, and time.  Skin: Skin is warm and dry. No rash noted. She is not diaphoretic. No erythema. No pallor.  Vitals reviewed.   Lab Results  Component Value Date   WBC 6.2 08/31/2016   HGB 12.4 08/31/2016   HCT 37.4 08/31/2016   PLT 233.0 08/31/2016    GLUCOSE 93 08/31/2016   CHOL 162 08/31/2016   TRIG 66.0 08/31/2016   HDL 65.20 08/31/2016   LDLCALC 83 08/31/2016   ALT 15 08/31/2016   AST 15 08/31/2016   NA 143 08/31/2016   K 3.9 08/31/2016   CL 110 08/31/2016   CREATININE 0.68 08/31/2016   BUN 12 08/31/2016   CO2 29 08/31/2016   TSH 0.64 08/31/2016   HGBA1C 4.9 07/30/2013    Ct Chest W Contrast  Result Date: 09/07/2016 CLINICAL DATA:  Fever for 1 week. Questionable nodular lesion medial right apex EXAM: CT CHEST WITH CONTRAST TECHNIQUE: Multidetector CT imaging of the chest was performed during intravenous contrast administration. CONTRAST:  64mL ISOVUE-300 IOPAMIDOL (ISOVUE-300) INJECTION 61% COMPARISON:  Thoracic spine radiographs August 30, 2016; chest CT April 14, 2011 FINDINGS: Cardiovascular: There is no appreciable thoracic aortic aneurysm or dissection. Visualized great vessels appear unremarkable except for minimal atherosclerotic calcification in the origins of these respective vessels. There are scattered foci of coronary artery calcification. Pericardium is not appreciably thickened. No pulmonary embolus is demonstrable on this study. Mediastinum/Nodes: No thyroid lesions are evident. There is no appreciable thoracic adenopathy. Lungs/Pleura: There is underlying centrilobular type emphysematous change. There are bullae in the upper lobes and lung apices bilaterally. There is no edema or consolidation. No pleural effusion or pleural thickening. There is mild scarring in the right middle lobe. There is no parenchymal lung mass in the area of concern on the recent thoracic radiograph. There is a stable 2 mm nodular opacity in the anterior segment of the left upper lobe seen on axial slice 68 series 3. There is a stable nodular opacity abutting the pleura in the anterior segment of the right middle lobe measuring 5 x 3 mm seen on axial slice 90 series 3. Upper Abdomen: No focal lesions are appreciable in the visualized upper  abdomen. Musculoskeletal: There are no blastic or lytic bone lesions. IMPRESSION: There is no parenchymal lung mass on CT corresponding to the area question on recent thoracic spine radiographs. It is felt that the opacity seen on the thoracic spine radiograph represents vascular prominence superimposed on bullous disease, accentuating this vascular prominence. Stable subcentimeter nodular lesions as described. Underlying emphysematous change. No airspace consolidation or edema. No adenopathy. Scattered foci of atherosclerotic calcification including coronary artery calcification. Electronically Signed   By: Bretta Bang III M.D.   On: 09/07/2016 13:24    Assessment & Plan:   Brodie was seen today for cough.  Diagnoses and all orders for this visit:  RTI (respiratory tract infection)- I will treat the infection with Omnicef and will control the cough with Phenergan DM. -     cefdinir (OMNICEF) 300 MG capsule; Take 1 capsule (300 mg total) by mouth 2 (two) times daily. -     promethazine-dextromethorphan (PROMETHAZINE-DM) 6.25-15 MG/5ML syrup; Take 5 mLs by mouth 4 (four) times daily as needed for cough.  COPD (chronic obstructive pulmonary disease) with chronic bronchitis (HCC)- her symptoms and exam are not consistent with an acute  exacerbation. I've asked her to continue using the LABA/LAMA combination inhaler.   I am having Ms. Theall start on cefdinir and promethazine-dextromethorphan. I am also having her maintain her aspirin, esomeprazole, umeclidinium-vilanterol, Vitamin D (Ergocalciferol), and chlorthalidone.  Meds ordered this encounter  Medications  . cefdinir (OMNICEF) 300 MG capsule    Sig: Take 1 capsule (300 mg total) by mouth 2 (two) times daily.    Dispense:  20 capsule    Refill:  0  . promethazine-dextromethorphan (PROMETHAZINE-DM) 6.25-15 MG/5ML syrup    Sig: Take 5 mLs by mouth 4 (four) times daily as needed for cough.    Dispense:  118 mL    Refill:  0      Follow-up: Return in about 3 weeks (around 04/11/2017).  Sanda Linger, MD

## 2017-03-21 NOTE — Patient Instructions (Signed)
Cough, Adult Coughing is a reflex that clears your throat and your airways. Coughing helps to heal and protect your lungs. It is normal to cough occasionally, but a cough that happens with other symptoms or lasts a long time may be a sign of a condition that needs treatment. A cough may last only 2-3 weeks (acute), or it may last longer than 8 weeks (chronic). What are the causes? Coughing is commonly caused by:  Breathing in substances that irritate your lungs.  A viral or bacterial respiratory infection.  Allergies.  Asthma.  Postnasal drip.  Smoking.  Acid backing up from the stomach into the esophagus (gastroesophageal reflux).  Certain medicines.  Chronic lung problems, including COPD (or rarely, lung cancer).  Other medical conditions such as heart failure.  Follow these instructions at home: Pay attention to any changes in your symptoms. Take these actions to help with your discomfort:  Take medicines only as told by your health care provider. ? If you were prescribed an antibiotic medicine, take it as told by your health care provider. Do not stop taking the antibiotic even if you start to feel better. ? Talk with your health care provider before you take a cough suppressant medicine.  Drink enough fluid to keep your urine clear or pale yellow.  If the air is dry, use a cold steam vaporizer or humidifier in your bedroom or your home to help loosen secretions.  Avoid anything that causes you to cough at work or at home.  If your cough is worse at night, try sleeping in a semi-upright position.  Avoid cigarette smoke. If you smoke, quit smoking. If you need help quitting, ask your health care provider.  Avoid caffeine.  Avoid alcohol.  Rest as needed.  Contact a health care provider if:  You have new symptoms.  You cough up pus.  Your cough does not get better after 2-3 weeks, or your cough gets worse.  You cannot control your cough with suppressant  medicines and you are losing sleep.  You develop pain that is getting worse or pain that is not controlled with pain medicines.  You have a fever.  You have unexplained weight loss.  You have night sweats. Get help right away if:  You cough up blood.  You have difficulty breathing.  Your heartbeat is very fast. This information is not intended to replace advice given to you by your health care provider. Make sure you discuss any questions you have with your health care provider. Document Released: 01/15/2011 Document Revised: 12/25/2015 Document Reviewed: 09/25/2014 Elsevier Interactive Patient Education  2017 [employer]  

## 2017-03-22 ENCOUNTER — Telehealth: Payer: Self-pay | Admitting: Internal Medicine

## 2017-03-22 NOTE — Telephone Encounter (Signed)
Patient states she was seen yesterday by Dr. Yetta Barre.  States he wrote her out of work yesterday.  States the medication Dr. Yetta Barre prescribed caused her this morning to not be able to stand up.  States she was bent over in the bathroom and fell into the bathroom floor.  States she is bruised and a little swollen on her knee cap.  Patient is requesting a letter to go to her employer to extend her days out of work.  Patient is requesting letter to be faxed to [phone].  Please follow up in regard to both.

## 2017-03-22 NOTE — Telephone Encounter (Signed)
I have left vm in regard.  Please follow up with patient in regard to med prescribed to see if patient might need something different.  Thanks!

## 2017-03-22 NOTE — Telephone Encounter (Signed)
Will you let patient know that the letter has been printed and faxed.

## 2017-03-22 NOTE — Telephone Encounter (Signed)
Pt had an episode this morning. Pt took the the Promethazine- DM and Omnicef. This morning when she got out of bed she became really dizzy and fell on her bathroom floor.  Any recommendation for her

## 2017-04-08 ENCOUNTER — Other Ambulatory Visit: Payer: Self-pay | Admitting: Internal Medicine

## 2017-04-08 DIAGNOSIS — M8000XA Age-related osteoporosis with current pathological fracture, unspecified site, initial encounter for fracture: Secondary | ICD-10-CM

## 2017-04-11 ENCOUNTER — Ambulatory Visit: Payer: BC Managed Care – PPO | Admitting: Internal Medicine

## 2017-04-16 ENCOUNTER — Other Ambulatory Visit: Payer: Self-pay | Admitting: Internal Medicine

## 2017-04-16 DIAGNOSIS — I517 Cardiomegaly: Secondary | ICD-10-CM

## 2017-04-16 DIAGNOSIS — I10 Essential (primary) hypertension: Secondary | ICD-10-CM

## 2017-04-18 ENCOUNTER — Other Ambulatory Visit: Payer: Self-pay | Admitting: Internal Medicine

## 2017-04-18 ENCOUNTER — Telehealth: Payer: Self-pay | Admitting: Internal Medicine

## 2017-04-18 DIAGNOSIS — J988 Other specified respiratory disorders: Secondary | ICD-10-CM

## 2017-04-18 MED ORDER — PROMETHAZINE-DM 6.25-15 MG/5ML PO SYRP: 5.0000 mL | ORAL_SOLUTION | Freq: Four times a day (QID) | ORAL | 0 refills | Status: DC | PRN

## 2017-04-18 NOTE — Telephone Encounter (Signed)
Patient is requesting some medication for her cough. She states its the same kind of cough she had a couple weeks when she was seen. Patient was informed she needed to set up an appointment and be seen. She states she does not have her copay and just needs to speak with the nurse. Patient is requesting a call back from the nurse. She states once she speaks with the nurse Dr.Jones will call her in a abx.

## 2017-04-18 NOTE — Telephone Encounter (Signed)
RX sent

## 2017-04-18 NOTE — Telephone Encounter (Signed)
Left detailed message for patient that Dr. Yetta Barre sent in prescriptions as requested.

## 2017-06-09 ENCOUNTER — Ambulatory Visit: Payer: BC Managed Care – PPO | Admitting: Internal Medicine

## 2017-06-09 ENCOUNTER — Encounter: Payer: Self-pay | Admitting: Internal Medicine

## 2017-06-09 ENCOUNTER — Ambulatory Visit (INDEPENDENT_AMBULATORY_CARE_PROVIDER_SITE_OTHER)
Admission: RE | Admit: 2017-06-09 | Discharge: 2017-06-09 | Disposition: A | Discharge status: Home / Self Care | Payer: BC Managed Care – PPO | Source: Ambulatory Visit | Attending: Internal Medicine | Admitting: Internal Medicine

## 2017-06-09 VITALS — BP 140/70 | HR 61 | Temp 98.0°F | Resp 16 | Ht 59.0 in | Wt 81.0 lb

## 2017-06-09 DIAGNOSIS — R05 Cough: Secondary | ICD-10-CM | POA: Diagnosis not present

## 2017-06-09 DIAGNOSIS — R059 Cough, unspecified: Secondary | ICD-10-CM

## 2017-06-09 DIAGNOSIS — R918 Other nonspecific abnormal finding of lung field: Secondary | ICD-10-CM | POA: Diagnosis not present

## 2017-06-09 DIAGNOSIS — J449 Chronic obstructive pulmonary disease, unspecified: Secondary | ICD-10-CM

## 2017-06-09 DIAGNOSIS — R911 Solitary pulmonary nodule: Secondary | ICD-10-CM

## 2017-06-09 DIAGNOSIS — I10 Essential (primary) hypertension: Secondary | ICD-10-CM

## 2017-06-09 MED ORDER — GLYCOPYRROLATE 25 MCG/ML IN SOLN: 1.0000 | Freq: Two times a day (BID) | RESPIRATORY_TRACT | 11 refills | Status: DC

## 2017-06-09 MED ORDER — FLUTICASONE-UMECLIDIN-VILANT 100-62.5-25 MCG/INH IN AEPB: 1.0000 | INHALATION_SPRAY | Freq: Every day | RESPIRATORY_TRACT | 1 refills | Status: DC

## 2017-06-09 NOTE — Patient Instructions (Signed)
Cough, Adult Coughing is a reflex that clears your throat and your airways. Coughing helps to heal and protect your lungs. It is normal to cough occasionally, but a cough that happens with other symptoms or lasts a long time may be a sign of a condition that needs treatment. A cough may last only 2-3 weeks (acute), or it may last longer than 8 weeks (chronic). What are the causes? Coughing is commonly caused by:  Breathing in substances that irritate your lungs.  A viral or bacterial respiratory infection.  Allergies.  Asthma.  Postnasal drip.  Smoking.  Acid backing up from the stomach into the esophagus (gastroesophageal reflux).  Certain medicines.  Chronic lung problems, including COPD (or rarely, lung cancer).  Other medical conditions such as heart failure.  Follow these instructions at home: Pay attention to any changes in your symptoms. Take these actions to help with your discomfort:  Take medicines only as told by your health care provider. ? If you were prescribed an antibiotic medicine, take it as told by your health care provider. Do not stop taking the antibiotic even if you start to feel better. ? Talk with your health care provider before you take a cough suppressant medicine.  Drink enough fluid to keep your urine clear or pale yellow.  If the air is dry, use a cold steam vaporizer or humidifier in your bedroom or your home to help loosen secretions.  Avoid anything that causes you to cough at work or at home.  If your cough is worse at night, try sleeping in a semi-upright position.  Avoid cigarette smoke. If you smoke, quit smoking. If you need help quitting, ask your health care provider.  Avoid caffeine.  Avoid alcohol.  Rest as needed.  Contact a health care provider if:  You have new symptoms.  You cough up pus.  Your cough does not get better after 2-3 weeks, or your cough gets worse.  You cannot control your cough with suppressant  medicines and you are losing sleep.  You develop pain that is getting worse or pain that is not controlled with pain medicines.  You have a fever.  You have unexplained weight loss.  You have night sweats. Get help right away if:  You cough up blood.  You have difficulty breathing.  Your heartbeat is very fast. This information is not intended to replace advice given to you by your health care provider. Make sure you discuss any questions you have with your health care provider. Document Released: 01/15/2011 Document Revised: 12/25/2015 Document Reviewed: 09/25/2014 Elsevier Interactive Patient Education  2017 [employer]  

## 2017-06-09 NOTE — Progress Notes (Signed)
Subjective:  Patient ID: Amber Griffith, female    DOB: 1951-01-31  Age: 66 y.o. MRN: [id number]  CC: Cough and COPD   HPI Amber Griffith presents for concerns about a persistent cough for the last 2 months.  She says the cough is productive of white phlegm.  She has mild shortness of breath but denies hemoptysis, fever, chills, or chest pain.  She tells me she has been using the Anoro inhaler.  Outpatient Medications Prior to Visit  Medication Sig Dispense Refill  . aspirin 81 MG tablet Take 81 mg by mouth daily.      . chlorthalidone (HYGROTON) 25 MG tablet TAKE 1 TABLET BY MOUTH EVERY DAY 90 tablet 1  . esomeprazole (NEXIUM) 40 MG packet Take 40 mg by mouth as needed. For heartburn    . promethazine-dextromethorphan (PROMETHAZINE-DM) 6.25-15 MG/5ML syrup Take 5 mLs by mouth 4 (four) times daily as needed for cough. 118 mL 0  . Vitamin D, Ergocalciferol, (DRISDOL) 50000 units CAPS capsule TAKE ONE CAPSULE BY MOUTH EVERY 7 DAYS 4 capsule 5  . umeclidinium-vilanterol (ANORO ELLIPTA) 62.5-25 MCG/INH AEPB Inhale 1 puff into the lungs daily. 30 each 11   No facility-administered medications prior to visit.     ROS Review of Systems  Constitutional: Negative.  Negative for chills, fatigue and fever.  HENT: Negative.  Negative for sore throat and trouble swallowing.   Eyes: Negative.   Respiratory: Positive for cough, shortness of breath and wheezing. Negative for choking.   Cardiovascular: Negative.  Negative for chest pain, palpitations and leg swelling.  Gastrointestinal: Negative for abdominal pain, constipation, diarrhea, nausea and vomiting.  Endocrine: Negative.   Genitourinary: Negative.  Negative for difficulty urinating.  Musculoskeletal: Negative.  Negative for back pain and myalgias.  Skin: Negative.  Negative for color change and rash.  Neurological: Negative.  Negative for dizziness.  Hematological: Negative for adenopathy. Does not bruise/bleed easily.     Objective:  BP 140/70 (BP Location: Left Arm, Patient Position: Sitting, Cuff Size: Normal)   Pulse 61   Temp 98 F (36.7 C) (Oral)   Resp 16   Ht _0  (1.499 m)   Wt 81 lb (36.7 kg)   SpO2 95%   BMI 16.36 kg/m   BP Readings from Last 3 Encounters:  06/09/17 140/70  03/21/17 124/70  09/24/16 138/78    Wt Readings from Last 3 Encounters:  06/09/17 81 lb (36.7 kg)  03/21/17 80 lb 4 oz (36.4 kg)  09/24/16 83 lb (37.6 kg)    Physical Exam  Constitutional: She is oriented to person, place, and time. No distress.  HENT:  Mouth/Throat: Oropharynx is clear and moist. No oropharyngeal exudate.  Eyes: Conjunctivae are normal. Right eye exhibits no discharge. Left eye exhibits no discharge. No scleral icterus.  Neck: Normal range of motion. Neck supple. No JVD present. No thyromegaly present.  Cardiovascular: Normal rate, regular rhythm and intact distal pulses. Exam reveals no gallop and no friction rub.  No murmur heard. Pulmonary/Chest: Effort normal. No accessory muscle usage. No tachypnea. No respiratory distress. She has no decreased breath sounds. She has no wheezes. She has rhonchi in the right middle field and the left upper field. She has no rales.  I heard a few rhonchi anteriorly in bilateral mid zones  Abdominal: Soft. Bowel sounds are normal. She exhibits no mass. There is no tenderness.  Musculoskeletal: Normal range of motion. She exhibits no edema, tenderness or deformity.  Lymphadenopathy:    She  has no cervical adenopathy.  Neurological: She is alert and oriented to person, place, and time.  Skin: Skin is warm and dry. No rash noted. She is not diaphoretic. No erythema. No pallor.  Vitals reviewed.   Lab Results  Component Value Date   WBC 6.2 08/31/2016   HGB 12.4 08/31/2016   HCT 37.4 08/31/2016   PLT 233.0 08/31/2016   GLUCOSE 93 08/31/2016   CHOL 162 08/31/2016   TRIG 66.0 08/31/2016   HDL 65.20 08/31/2016   LDLCALC 83 08/31/2016   ALT 15  08/31/2016   AST 15 08/31/2016   NA 143 08/31/2016   K 3.9 08/31/2016   CL 110 08/31/2016   CREATININE 0.68 08/31/2016   BUN 12 08/31/2016   CO2 29 08/31/2016   TSH 0.64 08/31/2016   HGBA1C 4.9 07/30/2013    Ct Chest W Contrast  Result Date: 09/07/2016 CLINICAL DATA:  Fever for 1 week. Questionable nodular lesion medial right apex EXAM: CT CHEST WITH CONTRAST TECHNIQUE: Multidetector CT imaging of the chest was performed during intravenous contrast administration. CONTRAST:  14m ISOVUE-300 IOPAMIDOL (ISOVUE-300) INJECTION 61% COMPARISON:  Thoracic spine radiographs August 30, 2016; chest CT April 14, 2011 FINDINGS: Cardiovascular: There is no appreciable thoracic aortic aneurysm or dissection. Visualized great vessels appear unremarkable except for minimal atherosclerotic calcification in the origins of these respective vessels. There are scattered foci of coronary artery calcification. Pericardium is not appreciably thickened. No pulmonary embolus is demonstrable on this study. Mediastinum/Nodes: No thyroid lesions are evident. There is no appreciable thoracic adenopathy. Lungs/Pleura: There is underlying centrilobular type emphysematous change. There are bullae in the upper lobes and lung apices bilaterally. There is no edema or consolidation. No pleural effusion or pleural thickening. There is mild scarring in the right middle lobe. There is no parenchymal lung mass in the area of concern on the recent thoracic radiograph. There is a stable 2 mm nodular opacity in the anterior segment of the left upper lobe seen on axial slice 68 series 3. There is a stable nodular opacity abutting the pleura in the anterior segment of the right middle lobe measuring 5 x 3 mm seen on axial slice 90 series 3. Upper Abdomen: No focal lesions are appreciable in the visualized upper abdomen. Musculoskeletal: There are no blastic or lytic bone lesions. IMPRESSION: There is no parenchymal lung mass on CT  corresponding to the area question on recent thoracic spine radiographs. It is felt that the opacity seen on the thoracic spine radiograph represents vascular prominence superimposed on bullous disease, accentuating this vascular prominence. Stable subcentimeter nodular lesions as described. Underlying emphysematous change. No airspace consolidation or edema. No adenopathy. Scattered foci of atherosclerotic calcification including coronary artery calcification. Electronically Signed   By: WLowella GripIII M.D.   On: 09/07/2016 13:24   No results found.   Assessment & Plan:   RAriannwas seen today for cough and copd.  Diagnoses and all orders for this visit:  Cough- Radiologist reports a new nodule on the right side. -     DG Chest 2 View; Future -     Fluticasone-Umeclidin-Vilant (TRELEGY ELLIPTA) 100-62.5-25 MCG/INH AEPB; Inhale 1 puff daily into the lungs.  COPD (chronic obstructive pulmonary disease) [place]- She may be having frequent exacerbations so I have asked her to try using a triple inhaler which will add an inhaled corticosteroid.  I gave her a sample of Trelegy and showed her how to use it.  Her technique was average.  If she has difficulty using the Ellipta inhaler then she will try using inhaled glycopyrrolate that is administered thru a nebulizer which may be easier for her to use. -     Glycopyrrolate (LONHALA MAGNAIR REFILL KIT) 25 MCG/ML SOLN; Inhale 1 Act 2 (two) times daily into the lungs. -     Glycopyrrolate (LONHALA MAGNAIR STARTER KIT) 25 MCG/ML SOLN; Inhale 1 Act 2 (two) times daily into the lungs.  Nodule of right lung- Will order a CT scan without contrast to see if this is suspicious for malignancy or infection. -     CT Chest Wo Contrast; Future  Abnormal findings on diagnostic imaging of lung -     CT Chest Wo Contrast; Future  Essential hypertension- her blood pressure is adequately well controlled. -     Basic metabolic panel;  Future   I have discontinued Leeloo L. Mudry's umeclidinium-vilanterol. I am also having her start on Fluticasone-Umeclidin-Vilant, Glycopyrrolate, and Glycopyrrolate. Additionally, I am having her maintain her aspirin, esomeprazole, Vitamin D (Ergocalciferol), chlorthalidone, and promethazine-dextromethorphan.  Meds ordered this encounter  Medications  . Fluticasone-Umeclidin-Vilant (TRELEGY ELLIPTA) 100-62.5-25 MCG/INH AEPB    Sig: Inhale 1 puff daily into the lungs.    Dispense:  90 each    Refill:  1  . Glycopyrrolate (LONHALA MAGNAIR REFILL KIT) 25 MCG/ML SOLN    Sig: Inhale 1 Act 2 (two) times daily into the lungs.    Dispense:  60 mL    Refill:  11  . Glycopyrrolate (LONHALA MAGNAIR STARTER KIT) 25 MCG/ML SOLN    Sig: Inhale 1 Act 2 (two) times daily into the lungs.    Dispense:  60 mL    Refill:  11     Follow-up: Return in about 3 months (around 09/09/2017).  Scarlette Calico, MD

## 2017-06-17 ENCOUNTER — Ambulatory Visit (INDEPENDENT_AMBULATORY_CARE_PROVIDER_SITE_OTHER)
Admission: RE | Admit: 2017-06-17 | Discharge: 2017-06-17 | Disposition: A | Discharge status: Home / Self Care | Payer: BC Managed Care – PPO | Source: Ambulatory Visit | Attending: Internal Medicine | Admitting: Internal Medicine

## 2017-06-17 DIAGNOSIS — R911 Solitary pulmonary nodule: Secondary | ICD-10-CM

## 2017-06-17 DIAGNOSIS — R918 Other nonspecific abnormal finding of lung field: Secondary | ICD-10-CM | POA: Diagnosis not present

## 2017-06-18 ENCOUNTER — Other Ambulatory Visit: Payer: Self-pay | Admitting: Internal Medicine

## 2017-06-18 DIAGNOSIS — J181 Lobar pneumonia, unspecified organism: Principal | ICD-10-CM

## 2017-06-18 DIAGNOSIS — J189 Pneumonia, unspecified organism: Secondary | ICD-10-CM

## 2017-06-18 MED ORDER — SULFAMETHOXAZOLE-TRIMETHOPRIM 800-160 MG PO TABS: 1.0000 | ORAL_TABLET | Freq: Two times a day (BID) | ORAL | 0 refills | Status: DC

## 2017-09-12 ENCOUNTER — Ambulatory Visit: Payer: BC Managed Care – PPO | Admitting: Internal Medicine

## 2017-09-23 ENCOUNTER — Ambulatory Visit (HOSPITAL_BASED_OUTPATIENT_CLINIC_OR_DEPARTMENT_OTHER): Admit: 2017-09-23 | Payer: Medicare Other | Admitting: Orthopedic Surgery

## 2017-09-23 ENCOUNTER — Encounter (HOSPITAL_COMMUNITY)
Admission: EM | Disposition: A | Discharge status: Home / Self Care | Payer: Self-pay | Source: Home / Self Care | Attending: Emergency Medicine

## 2017-09-23 ENCOUNTER — Emergency Department (HOSPITAL_BASED_OUTPATIENT_CLINIC_OR_DEPARTMENT_OTHER): Payer: Worker's Compensation | Admitting: Anesthesiology

## 2017-09-23 ENCOUNTER — Emergency Department (HOSPITAL_COMMUNITY)
Admission: EM | Admit: 2017-09-23 | Discharge: 2017-09-23 | Disposition: A | Discharge status: Home / Self Care | Payer: Worker's Compensation | Attending: Emergency Medicine | Admitting: Emergency Medicine

## 2017-09-23 ENCOUNTER — Encounter (HOSPITAL_COMMUNITY): Payer: Self-pay

## 2017-09-23 ENCOUNTER — Emergency Department (HOSPITAL_COMMUNITY): Payer: Worker's Compensation

## 2017-09-23 ENCOUNTER — Other Ambulatory Visit: Payer: Self-pay

## 2017-09-23 DIAGNOSIS — F17210 Nicotine dependence, cigarettes, uncomplicated: Secondary | ICD-10-CM | POA: Insufficient documentation

## 2017-09-23 DIAGNOSIS — S68620A Partial traumatic transphalangeal amputation of right index finger, initial encounter: Secondary | ICD-10-CM | POA: Diagnosis not present

## 2017-09-23 DIAGNOSIS — W3189XA Contact with other specified machinery, initial encounter: Secondary | ICD-10-CM | POA: Insufficient documentation

## 2017-09-23 DIAGNOSIS — S68110A Complete traumatic metacarpophalangeal amputation of right index finger, initial encounter: Secondary | ICD-10-CM | POA: Diagnosis present

## 2017-09-23 DIAGNOSIS — S61209A Unspecified open wound of unspecified finger without damage to nail, initial encounter: Secondary | ICD-10-CM

## 2017-09-23 DIAGNOSIS — Y990 Civilian activity done for income or pay: Secondary | ICD-10-CM | POA: Insufficient documentation

## 2017-09-23 HISTORY — PX: AMPUTATION: SHX166

## 2017-09-23 SURGERY — AMPUTATION DIGIT
Anesthesia: General | Site: Finger | Laterality: Right

## 2017-09-23 MED ORDER — MORPHINE SULFATE (PF) 4 MG/ML IV SOLN
4.0000 mg | Freq: Once | INTRAVENOUS | Status: AC
Start: 2017-09-23 — End: 2017-09-23
  Administered 2017-09-23: 4 mg via INTRAVENOUS
  Filled 2017-09-23: qty 1

## 2017-09-23 MED ORDER — FENTANYL CITRATE (PF) 100 MCG/2ML IJ SOLN
INTRAMUSCULAR | Status: AC
  Filled 2017-09-23: qty 2

## 2017-09-23 MED ORDER — ONDANSETRON HCL 4 MG/2ML IJ SOLN
INTRAMUSCULAR | Status: DC | PRN
  Administered 2017-09-23: 4 mg via INTRAVENOUS

## 2017-09-23 MED ORDER — BUPIVACAINE HCL (PF) 0.25 % IJ SOLN
INTRAMUSCULAR | Status: DC | PRN
  Administered 2017-09-23: 10 mL

## 2017-09-23 MED ORDER — LACTATED RINGERS IV SOLN
INTRAVENOUS | Status: DC
  Administered 2017-09-23: 15:00:00 via INTRAVENOUS

## 2017-09-23 MED ORDER — FENTANYL CITRATE (PF) 100 MCG/2ML IJ SOLN: 25.0000 ug | INTRAMUSCULAR | Status: DC | PRN

## 2017-09-23 MED ORDER — ONDANSETRON HCL 4 MG/2ML IJ SOLN: 4.0000 mg | Freq: Four times a day (QID) | INTRAMUSCULAR | Status: DC | PRN

## 2017-09-23 MED ORDER — LIDOCAINE 2% (20 MG/ML) 5 ML SYRINGE
INTRAMUSCULAR | Status: AC
  Filled 2017-09-23: qty 5

## 2017-09-23 MED ORDER — FENTANYL CITRATE (PF) 100 MCG/2ML IJ SOLN
50.0000 ug | INTRAMUSCULAR | Status: DC | PRN
  Administered 2017-09-23: 100 ug via INTRAVENOUS

## 2017-09-23 MED ORDER — DEXAMETHASONE SODIUM PHOSPHATE 10 MG/ML IJ SOLN
INTRAMUSCULAR | Status: AC
  Filled 2017-09-23: qty 1

## 2017-09-23 MED ORDER — ONDANSETRON HCL 4 MG/2ML IJ SOLN
INTRAMUSCULAR | Status: AC
  Filled 2017-09-23: qty 2

## 2017-09-23 MED ORDER — PROPOFOL 500 MG/50ML IV EMUL
INTRAVENOUS | Status: AC
  Filled 2017-09-23: qty 50

## 2017-09-23 MED ORDER — MIDAZOLAM HCL 2 MG/2ML IJ SOLN
1.0000 mg | INTRAMUSCULAR | Status: DC | PRN
  Administered 2017-09-23: 2 mg via INTRAVENOUS

## 2017-09-23 MED ORDER — HYDROCODONE-ACETAMINOPHEN 5-325 MG PO TABS: ORAL_TABLET | ORAL | 0 refills | Status: DC

## 2017-09-23 MED ORDER — OXYCODONE HCL 5 MG/5ML PO SOLN: 5.0000 mg | Freq: Once | ORAL | Status: DC | PRN

## 2017-09-23 MED ORDER — SCOPOLAMINE 1 MG/3DAYS TD PT72
1.0000 | MEDICATED_PATCH | Freq: Once | TRANSDERMAL | Status: DC | PRN
Start: 2017-09-23 — End: 2017-09-23

## 2017-09-23 MED ORDER — OXYCODONE HCL 5 MG PO TABS: 5.0000 mg | ORAL_TABLET | Freq: Once | ORAL | Status: DC | PRN

## 2017-09-23 MED ORDER — CEFAZOLIN SODIUM-DEXTROSE 2-3 GM-%(50ML) IV SOLR
INTRAVENOUS | Status: DC | PRN
  Administered 2017-09-23: 2 g via INTRAVENOUS

## 2017-09-23 MED ORDER — MIDAZOLAM HCL 2 MG/2ML IJ SOLN
INTRAMUSCULAR | Status: AC
  Filled 2017-09-23: qty 2

## 2017-09-23 MED ORDER — LIDOCAINE HCL (CARDIAC) 20 MG/ML IV SOLN
INTRAVENOUS | Status: DC | PRN
  Administered 2017-09-23: 50 mg via INTRAVENOUS

## 2017-09-23 MED ORDER — PROPOFOL 10 MG/ML IV BOLUS
INTRAVENOUS | Status: DC | PRN
  Administered 2017-09-23: 150 mg via INTRAVENOUS

## 2017-09-23 MED ORDER — DEXAMETHASONE SODIUM PHOSPHATE 4 MG/ML IJ SOLN
INTRAMUSCULAR | Status: DC | PRN
  Administered 2017-09-23: 10 mg via INTRAVENOUS

## 2017-09-23 SURGICAL SUPPLY — 53 items
BANDAGE ACE 3X5.8 VEL STRL LF (GAUZE/BANDAGES/DRESSINGS) IMPLANT
BLADE MINI RND TIP GREEN BEAV (BLADE) IMPLANT
BLADE SURG 15 STRL LF DISP TIS (BLADE) ×2 IMPLANT
BLADE SURG 15 STRL SS (BLADE) ×4
BNDG CMPR 9X4 STRL LF SNTH (GAUZE/BANDAGES/DRESSINGS) ×1
BNDG COHESIVE 1X5 TAN STRL LF (GAUZE/BANDAGES/DRESSINGS) IMPLANT
BNDG ELASTIC 2X5.8 VLCR STR LF (GAUZE/BANDAGES/DRESSINGS) IMPLANT
BNDG ESMARK 4X9 LF (GAUZE/BANDAGES/DRESSINGS) ×1 IMPLANT
BNDG GAUZE 1X2.1 STRL (MISCELLANEOUS) IMPLANT
CHLORAPREP W/TINT 26ML (MISCELLANEOUS) IMPLANT
CORD BIPOLAR FORCEPS 12FT (ELECTRODE) ×2 IMPLANT
COVER BACK TABLE 60X90IN (DRAPES) ×2 IMPLANT
COVER MAYO STAND STRL (DRAPES) ×2 IMPLANT
DECANTER SPIKE VIAL GLASS SM (MISCELLANEOUS) IMPLANT
DRAIN PENROSE 1/4X12 LTX STRL (WOUND CARE) IMPLANT
DRAPE EXTREMITY T 121X128X90 (DRAPE) ×2 IMPLANT
DRAPE OEC MINIVIEW 54X84 (DRAPES) IMPLANT
DRAPE SURG 17X23 STRL (DRAPES) ×2 IMPLANT
GAUZE SPONGE 4X4 12PLY STRL (GAUZE/BANDAGES/DRESSINGS) ×2 IMPLANT
GAUZE SPONGE 4X4 12PLY STRL LF (GAUZE/BANDAGES/DRESSINGS) IMPLANT
GAUZE XEROFORM 1X8 LF (GAUZE/BANDAGES/DRESSINGS) ×2 IMPLANT
GLOVE BIO SURGEON STRL SZ 6.5 (GLOVE) ×1 IMPLANT
GLOVE BIO SURGEON STRL SZ7.5 (GLOVE) ×2 IMPLANT
GLOVE BIOGEL PI IND STRL 7.0 (GLOVE) IMPLANT
GLOVE BIOGEL PI IND STRL 8 (GLOVE) ×1 IMPLANT
GLOVE BIOGEL PI IND STRL 8.5 (GLOVE) IMPLANT
GLOVE BIOGEL PI INDICATOR 7.0 (GLOVE) ×1
GLOVE BIOGEL PI INDICATOR 8 (GLOVE) ×2
GLOVE BIOGEL PI INDICATOR 8.5 (GLOVE) ×1
GLOVE SURG ORTHO 8.0 STRL STRW (GLOVE) ×1 IMPLANT
GOWN STRL REUS W/ TWL LRG LVL3 (GOWN DISPOSABLE) ×1 IMPLANT
GOWN STRL REUS W/TWL LRG LVL3 (GOWN DISPOSABLE) ×2
GOWN STRL REUS W/TWL XL LVL3 (GOWN DISPOSABLE) ×2 IMPLANT
NDL HYPO 25X1 1.5 SAFETY (NEEDLE) IMPLANT
NEEDLE HYPO 25X1 1.5 SAFETY (NEEDLE) ×2 IMPLANT
NS IRRIG 1000ML POUR BTL (IV SOLUTION) ×2 IMPLANT
PACK BASIN DAY SURGERY FS (CUSTOM PROCEDURE TRAY) ×2 IMPLANT
PADDING CAST ABS 4INX4YD NS (CAST SUPPLIES) ×1
PADDING CAST ABS COTTON 4X4 ST (CAST SUPPLIES) ×1 IMPLANT
SPLINT FINGER 3.25 BULB 911905 (SOFTGOODS) ×1 IMPLANT
STOCKINETTE 4X48 STRL (DRAPES) ×2 IMPLANT
SUT CHROMIC 4 0 P 3 18 (SUTURE) IMPLANT
SUT ETHILON 3 0 PS 1 (SUTURE) IMPLANT
SUT ETHILON 4 0 PS 2 18 (SUTURE) IMPLANT
SUT MERSILENE 4 0 P 3 (SUTURE) IMPLANT
SUT MON AB 5-0 P3 18 (SUTURE) ×1 IMPLANT
SUT VIC AB 4-0 P-3 18XBRD (SUTURE) IMPLANT
SUT VIC AB 4-0 P3 18 (SUTURE)
SYR BULB 3OZ (MISCELLANEOUS) ×2 IMPLANT
SYR CONTROL 10ML LL (SYRINGE) ×1 IMPLANT
TOWEL OR 17X24 6PK STRL BLUE (TOWEL DISPOSABLE) ×2 IMPLANT
TRAY DSU PREP LF (CUSTOM PROCEDURE TRAY) ×2 IMPLANT
UNDERPAD 30X30 (UNDERPADS AND DIAPERS) ×2 IMPLANT

## 2017-09-23 NOTE — Op Note (Signed)
NAMESHAKIRRA, BUEHLER NO.:  [id number]  MEDICAL RECORD NO.:  [id number]  LOCATION:                                 FACILITY:  PHYSICIAN:  Betha Loa, MD             DATE OF BIRTH:  DATE OF PROCEDURE:  09/23/2017 DATE OF DISCHARGE:                              OPERATIVE REPORT   PREOPERATIVE DIAGNOSIS:  Right index finger amputation/degloving.  POSTOPERATIVE DIAGNOSIS:  Right index finger amputation/degloving.  PROCEDURE:  Right index finger revision amputation through midportion distal phalanx with ablation of nail.  SURGEON:  Betha Loa, MD.  ASSISTANT:  Cindee Salt, MD.  ANESTHESIA:  General IV fluids per Anesthesia flow sheet.  ESTIMATED BLOOD LOSS:  Minimal.  COMPLICATIONS:  None.  SPECIMENS:  None.  TOURNIQUET TIME:  22 minutes.  DISPOSITION:  Stable to PACU.  INDICATIONS:  Ms. Mosely is a 67 year old right-hand dominant female, who states she injured her right index finger, which got caught in a roller at work this morning.  She was seen at the [facility] Emergency Department.  We discussed treatment options including full-thickness skin grafting, cross-finger flap, revision amputation.  Risks, benefits, and alternatives of surgery were discussed including the risk of blood loss; infection; damage to nerves, vessels, tendons, ligaments, bone; failure of surgery; need for additional surgery; complications with wound healing.  She voiced understanding of these risks and elected to proceed.  She elected to proceed with revision amputation.  OPERATIVE COURSE:  After being identified preoperatively by myself, the patient and I agreed upon procedure and site of procedure.  Surgical site was marked.  Risks, benefits, and alternatives of surgery were reviewed and she wished to proceed.  Surgical consent had been signed. She was given IV Ancef as preoperative antibiotic prophylaxis.  She was transferred to the operating room and placed on the  operating room table in supine position with the right upper extremity on arm board.  General anesthesia was induced by anesthesiologist.  Right upper extremity was prepped and draped in normal sterile orthopedic fashion.  Surgical pause was performed between surgeons, Anesthesia, and operating room staff and all were in agreement as to the patient, procedure, and site of procedure.  Tourniquet at the proximal aspect of the extremity was inflated to 250 mmHg after exsanguination of the limb with an Esmarch bandage.  The wound was explored.  The distal phalanx was exposed with the exception of a small sheath of soft tissue.  The neurovascular structures were identified and treated with bipolar and allowed to retract.  The nail was removed with a Therapist, nutritional.  The nail matrix and germinal matrix were then isolated and removed with the knife leaving the dorsal skin.  The distal phalanx was then amputated through its midpoint distal to the FDP insertion.  The wound was copiously irrigated with sterile saline.  The bone was rongeured down to appropriate level to allow soft tissue coverage.  The volar soft tissues were mobilized and able to be brought over the end of the bone.  Soft tissues were reapproximated using 5-0 Monocryl suture in an interrupted fashion.  Acceptable contour of the finger was  obtained.  The wound was dressed with sterile Xeroform and 4x4s and wrapped with a Coban dressing lightly.  Alumafoam splint was placed and wrapped lightly with Coban dressing.  A digital block was performed with 10 mL of 0.25% plain Marcaine to aid in postoperative analgesia.  The tourniquet was deflated at 22 minutes.  Fingertips were pink with brisk capillary refill after deflation of tourniquet.  The operative drapes were broken down.  The patient was awoken from anesthesia safely.  She was transferred back to the stretcher and taken to PACU in stable condition.  I will see her back in the  office in 1 week for postoperative followup.  We will give her Norco 5/325 one to two p.o. q.6 hours p.r.n. pain, dispensed #30.     Betha LoaKevin Kendall Arnell, MD     KK/MEDQ  D:  09/23/2017  T:  09/23/2017  Job:  [id number]

## 2017-09-23 NOTE — Brief Op Note (Signed)
09/23/2017  4:08 PM  PATIENT:  Amber Griffith  67 y.o. female  PRE-OPERATIVE DIAGNOSIS:  Partial Amputation Right Index Finger  POST-OPERATIVE DIAGNOSIS:  Partial Amputation Right Index Finge  PROCEDURE:  Procedure(s): REVISION AMPUTATION RIGHT INDEX FINGER (Right)  SURGEON:  Surgeon(s) and Role:    * Betha LoaKuzma, Jomari Bartnik, MD - Primary    * Cindee SaltKuzma, Gary, MD - Assisting  PHYSICIAN ASSISTANT:   ASSISTANTS: Cindee SaltGary Oleva Koo, MD   ANESTHESIA:   general  EBL:  5 mL   BLOOD ADMINISTERED:none  DRAINS: none   LOCAL MEDICATIONS USED:  MARCAINE     SPECIMEN:  No Specimen  DISPOSITION OF SPECIMEN:  N/A  COUNTS:  YES  TOURNIQUET:   Total Tourniquet Time Documented: Upper Arm (Right) - 22 minutes Total: Upper Arm (Right) - 22 minutes   DICTATION: .Other Dictation: Dictation Number [id number]  PLAN OF CARE: Discharge to home after PACU  PATIENT DISPOSITION:  PACU - hemodynamically stable.

## 2017-09-23 NOTE — ED Notes (Signed)
Pt taken to day surgery by security. Day surgery RN called and made aware

## 2017-09-23 NOTE — ED Triage Notes (Signed)
Pt from work with finger injury (right pointer) pt got hand stuck in laundry press and the back meaty part of right pointer finger got cut off

## 2017-09-23 NOTE — Transfer of Care (Signed)
Immediate Anesthesia Transfer of Care Note  Patient: Amber Griffith  Procedure(s) Performed: REVISION AMPUTATION RIGHT INDEX FINGER (Right Finger)  Patient Location: PACU  Anesthesia Type:General  Level of Consciousness: awake and sedated  Airway & Oxygen Therapy: Patient Spontanous Breathing and Patient connected to face mask oxygen  Post-op Assessment: Report given to RN and Post -op Vital signs reviewed and stable  Post vital signs: Reviewed and stable  Last Vitals:  Vitals:   09/23/17 1449 09/23/17 1609  BP: (!) 156/81   Pulse: 61   Resp:    Temp: 36.7 C 36.9 C  SpO2: 100%     Last Pain:  Vitals:   09/23/17 1449  TempSrc: Oral  PainSc: 5          Complications: No apparent anesthesia complications

## 2017-09-23 NOTE — Op Note (Signed)
[id number] 

## 2017-09-23 NOTE — H&P (Signed)
Amber Griffith is an 67 y.o. female.   Chief Complaint: right index finger amputation HPI: 67 yo rhd female states that while at work today a roller on a [employer] caught right index fingertip and removed it.  She reports no previous injury to the finger and no other injury at this time.  Throbbing pain of 5/10 severity.  Associated bleeding.  Note by Buel Ream, PAC from 09/23/2017 reviewed. Xrays viewed and interpreted by me: 3 views right index finger show no fractures, dislocations, radioopaque foreign bodies.  Labs reviewed: none  Allergies: No Known Allergies  Past Medical History:  Diagnosis Date  . ALLERGIC RHINITIS 09/01/2007  . Anxiety and depression 12/24/2010  . Cardiomegaly 01/17/2009  . CONSTIPATION, CHRONIC 12/18/2008  . COPD 04/04/2007  . ELECTROCARDIOGRAM, ABNORMAL 01/17/2009  . GERD 04/04/2007  . HEADACHE, TENSION 04/18/2008  . HEMORRHOIDS 09/25/2007  . HYPERTENSION 06/12/2007  . SINUSITIS- ACUTE-NOS 05/08/2010  . TOBACCO ABUSE 04/04/2007  . UTI 09/30/2010  . Vaginitis and vulvovaginitis, unspecified 09/30/2010    Past Surgical History:  Procedure Laterality Date  . BREAST BIOPSY    . s/p breast bx     left  . s/p umbilical hernia    . TONSILLECTOMY    . TUBAL LIGATION      Family History: Family History  Problem Relation Age of Onset  . Asthma Mother   . Hypertension Mother   . COPD Maternal Uncle   . Hypertension Father   . Seizures Father   . Stroke Paternal Uncle     Social History:   reports that she has been smoking cigarettes.  She has a 35.00 pack-year smoking history. she has never used smokeless tobacco. She reports that she drinks alcohol. She reports that she does not use drugs.  Medications:  (Not in a hospital admission)  No results found for this or any previous visit (from the past 48 hour(s)).  Dg Finger Index Right  Result Date: 09/23/2017 CLINICAL DATA:  Right index finger injury, caught in a laundry press. Initial  encounter. EXAM: RIGHT INDEX FINGER 2+V COMPARISON:  None. FINDINGS: Bandage material is noted about the index finger with soft tissue deficiency involving the distal aspect of the finger. Bandage material limits assessment of fine osseous detail. No displaced fracture or dislocation is identified. No radiopaque foreign body is seen. IMPRESSION: Index finger soft tissue injury without evidence of displaced fracture or dislocation. Electronically Signed   By: Sebastian Ache M.D.   On: 09/23/2017 11:45     A comprehensive review of systems was negative. Review of Systems: No fevers, chills, night sweats, chest pain, shortness of breath, nausea, vomiting, diarrhea, constipation, easy bleeding or bruising, headaches, dizziness, vision changes, fainting.   Blood pressure (!) 156/81, pulse 61, temperature 98.1 F (36.7 C), temperature source Oral, resp. rate 16, height 4\' 11"  (1.499 m), weight 38.6 kg (85 lb), SpO2 100 %.  General appearance: alert, cooperative and appears stated age Head: Normocephalic, without obvious abnormality, atraumatic Neck: supple, symmetrical, trachea midline Resp: clear to auscultation bilaterally Cardio: regular rate and rhythm Extremities: Intact sensation and capillary refill all digits.  +epl/fpl/io.  Right index finger with loss of soft tissue distal to dip joint.  Bone degolved.  Nail intact. Pulses: 2+ and symmetric Skin: Skin color, texture, turgor normal. No rashes or lesions Neurologic: Grossly normal Incision/Wound: As above  Assessment/Plan Right index finger soft tissue amputation/degloving distal to dip joint.  We discussed treatment options including full thickness skin grafting, cross  finger flap, and revision amputation.  She states she is not concerned with maintaining the fingernail or length of the finger and wants what is best.  She does not want multiple surgical procedures.  Given patient age and medical factors, I feel revision amputation will leave  her with the most functional and least stiff finger with a single surgical setting.  Will maintain as much length as possible.  Likely will need removal of nail matrix.  Risks, benefits and alternatives of surgery were discussed including risks of blood loss, infection, damage to nerves/vessels/tendons/ligament/bone, failure of surgery, need for additional surgery, complication with wound healing, stiffness.  She voiced understanding of these risks and elected to proceed.    Adie Vilar R 09/23/2017, 3:13 PM

## 2017-09-23 NOTE — ED Notes (Signed)
Patient transported to X-ray 

## 2017-09-23 NOTE — ED Provider Notes (Signed)
[facility] EMERGENCY DEPARTMENT Provider Note   CSN: [id number] Arrival date & time: 09/23/17  1110     History   Chief Complaint Chief Complaint  Patient presents with  . Finger Injury    HPI Amber Griffith is a 67 y.o. female with history of hypertension, tobacco abuse, GERD, anxiety, depression who presents with right index finger injury.  Patient reports she was at work at [employer] when she caught her finger in a [employer].  Someone at work immediately helped her wrap her finger up.  She is right-handed.  She has sensation and pain intact.  She denies numbness. She has full range of motion of the digit.  She denies any other injuries.  She states she is very anxious, however otherwise no other complaints.   HPI  Past Medical History:  Diagnosis Date  . ALLERGIC RHINITIS 09/01/2007  . Anxiety and depression 12/24/2010  . Cardiomegaly 01/17/2009  . CONSTIPATION, CHRONIC 12/18/2008  . COPD 04/04/2007  . ELECTROCARDIOGRAM, ABNORMAL 01/17/2009  . GERD 04/04/2007  . HEADACHE, TENSION 04/18/2008  . HEMORRHOIDS 09/25/2007  . HYPERTENSION 06/12/2007  . SINUSITIS- ACUTE-NOS 05/08/2010  . TOBACCO ABUSE 04/04/2007  . UTI 09/30/2010  . Vaginitis and vulvovaginitis, unspecified 09/30/2010    Patient Active Problem List   Diagnosis Date Noted  . Pneumonia of right middle lobe due to infectious organism ([place]) 06/18/2017  . Vitamin D deficiency 08/31/2016  . Cervical cancer screening 08/30/2016  . Visit for screening mammogram 08/30/2016  . Chronic midline thoracic back pain 08/30/2016  . Chronic low back pain without sciatica 06/23/2015  . Compression fracture of lumbar spine, non-traumatic ([place]) 06/23/2015  . Osteoporosis 06/23/2015  . Other abnormal glucose 07/30/2013  . Cough 07/30/2013  . Atopic dermatitis 11/10/2012  . Pure hypercholesterolemia 03/01/2012  . Hemorrhoids without complication 82/50/5397  . Nodule of right lung 04/09/2011  . Anxiety and  depression 12/24/2010  . Insomnia 12/24/2010  . Routine general medical examination at a health care facility 12/24/2010  . Cardiomegaly 01/17/2009  . CONSTIPATION, CHRONIC 12/18/2008  . ALLERGIC RHINITIS 09/01/2007  . Essential hypertension 06/12/2007  . TOBACCO ABUSE 04/04/2007  . COPD (chronic obstructive pulmonary disease) with chronic bronchitis ([place]) 04/04/2007  . GERD 04/04/2007    Past Surgical History:  Procedure Laterality Date  . BREAST BIOPSY    . s/p breast bx     left  . s/p umbilical hernia    . TONSILLECTOMY    . TUBAL LIGATION      OB History    Gravida Para Term Preterm AB Living   _0 SAB TAB Ectopic Multiple Live Births       1           Home Medications    Prior to Admission medications   Medication Sig Start Date End Date Taking? Authorizing Provider  aspirin 81 MG tablet Take 81 mg by mouth daily.      [provider]  chlorthalidone (HYGROTON) 25 MG tablet TAKE 1 TABLET BY MOUTH EVERY DAY 04/16/17   Janith Lima, MD  esomeprazole (NEXIUM) 40 MG packet Take 40 mg by mouth as needed. For heartburn    [provider]  Fluticasone-Umeclidin-Vilant (TRELEGY ELLIPTA) 100-62.5-25 MCG/INH AEPB Inhale 1 puff daily into the lungs. 06/09/17   Janith Lima, MD  Glycopyrrolate [facility] MAGNAIR REFILL KIT) 25 MCG/ML SOLN Inhale 1 Act 2 (two) times daily into the lungs.  06/09/17   Janith Lima, MD  Glycopyrrolate [facility] MAGNAIR STARTER KIT) 25 MCG/ML SOLN Inhale 1 Act 2 (two) times daily into the lungs. 06/09/17   Janith Lima, MD  HYDROcodone-acetaminophen [facility]) 5-325 MG tablet 1-2 tabs po q6 hours prn pain 09/23/17   Leanora Cover, MD  promethazine-dextromethorphan (PROMETHAZINE-DM) 6.25-15 MG/5ML syrup Take 5 mLs by mouth 4 (four) times daily as needed for cough. 04/18/17   Janith Lima, MD  sulfamethoxazole-trimethoprim (BACTRIM DS,SEPTRA DS) 800-160 MG tablet Take 1 tablet 2 (two) times daily by mouth. 06/18/17   Janith Lima, MD  Vitamin D, Ergocalciferol, (DRISDOL) 50000 units CAPS capsule TAKE ONE CAPSULE BY MOUTH EVERY 7 DAYS 04/08/17   Janith Lima, MD    Family History Family History  Problem Relation Age of Onset  . Asthma Mother   . Hypertension Mother   . COPD Maternal Uncle   . Hypertension Father   . Seizures Father   . Stroke Paternal Uncle     Social History Social History   Tobacco Use  . Smoking status: Current Every Day Smoker    Packs/day: 1.00    Years: 35.00    Pack years: 35.00    Types: Cigarettes  . Smokeless tobacco: Never Used  Substance Use Topics  . Alcohol use: Yes    Alcohol/week: 0.0 oz    Comment: 2 cans per day  . Drug use: No     Allergies   Patient has no known allergies.   Review of Systems Review of Systems  Constitutional: Negative for chills and fever.  HENT: Negative for facial swelling and sore throat.   Respiratory: Negative for shortness of breath.   Cardiovascular: Negative for chest pain.  Gastrointestinal: Negative for abdominal pain, nausea and vomiting.  Genitourinary: Negative for dysuria.  Musculoskeletal: Negative for back pain.  Skin: Positive for wound. Negative for rash.  Neurological: Negative for headaches.  Psychiatric/Behavioral: The patient is not nervous/anxious.      Physical Exam Updated Vital Signs BP (!) 160/82 (BP Location: Right Arm)   Pulse 72   Temp 97.8 F (36.6 C)   Resp 20   Ht _0  (1.499 m)   Wt 38.6 kg (85 lb)   SpO2 98%   BMI 17.17 kg/m   Physical Exam  Constitutional: She appears well-developed and well-nourished. No distress.  HENT:  Head: Normocephalic and atraumatic.  Mouth/Throat: Oropharynx is clear and moist. No oropharyngeal exudate.  Eyes: Conjunctivae are normal. Pupils are equal, round, and reactive to light. Right eye exhibits no discharge. Left eye exhibits no discharge. No scleral icterus.  Neck: Normal range of motion. Neck supple. No thyromegaly present.    Cardiovascular: Normal rate, regular rhythm, normal heart sounds and intact distal pulses. Exam reveals no gallop and no friction rub.  No murmur heard. Pulmonary/Chest: Effort normal and breath sounds normal. No stridor. No respiratory distress. She has no wheezes. She has no rales.  Abdominal: Soft. Bowel sounds are normal. She exhibits no distension. There is no tenderness. There is no rebound and no guarding.  Musculoskeletal: She exhibits no edema.  R index finger: Complete palmar fingertip avulsion distal to DIP; range of motion intact at the DIP; pain sensation intact; see photos  Lymphadenopathy:    She has no cervical adenopathy.  Neurological: She is alert. Coordination normal.  Skin: Skin is warm and dry. No rash noted. She is not diaphoretic. No pallor.  Psychiatric: She has a normal mood and affect.  Nursing note and vitals reviewed.          ED Treatments / Results  Labs (all labs ordered are listed, but only abnormal results are displayed) Labs Reviewed - No data to display  EKG  EKG Interpretation None       Radiology Dg Finger Index Right  Result Date: 09/23/2017 CLINICAL DATA:  Right index finger injury, caught in a laundry press. Initial encounter. EXAM: RIGHT INDEX FINGER 2+V COMPARISON:  None. FINDINGS: Bandage material is noted about the index finger with soft tissue deficiency involving the distal aspect of the finger. Bandage material limits assessment of fine osseous detail. No displaced fracture or dislocation is identified. No radiopaque foreign body is seen. IMPRESSION: Index finger soft tissue injury without evidence of displaced fracture or dislocation. Electronically Signed   By: Logan Bores M.D.   On: 09/23/2017 11:45    Procedures Procedures (including critical care time)  Medications Ordered in ED Medications  lactated ringers infusion ( Intravenous Stopped 09/23/17 1702)  midazolam (VERSED) injection 1-2 mg (2 mg Intravenous Given  09/23/17 1524)  scopolamine (TRANSDERM-SCOP) 1 MG/3DAYS 1.5 mg (not administered)  oxyCODONE (Oxy IR/ROXICODONE) immediate release tablet 5 mg (not administered)    Or  oxyCODONE (ROXICODONE) 5 MG/5ML solution 5 mg (not administered)  ondansetron (ZOFRAN) injection 4 mg (not administered)  fentaNYL (SUBLIMAZE) injection 25-50 mcg (not administered)  morphine 4 MG/ML injection 4 mg (4 mg Intravenous Given 09/23/17 1242)     Initial Impression / Assessment and Plan / ED Course  I have reviewed the triage vital signs and the nursing notes.  Pertinent labs & imaging results that were available during my care of the patient were reviewed by me and considered in my medical decision making (see chart for details).  Clinical Course as of Sep 23 1920  Fri Feb 22, 466  [id number] 67 year old female with injury to right index finger distal, caught impressed with avulsion of the volar surface of the pad.  The nail is still there and intact.  Well-perfused.  Discussion with hand surgery they recommend transfer over to the clinic where they can evaluate and treat.  [MB]    Clinical Course User Index [MB] Hayden Rasmussen, MD    Patient with right index finger avulsion/degloving on the palmar aspect distal to the DIP.  Nail is intact.  Pain sensation intact.  I spoke with hand surgeon, Dr. Levell July nurse, who relayed all information to Dr. Fredna Dow, and Dr. Fredna Dow would like patient transferred to the day surgery center for repair.  This was arranged and security transported patient to the Center considering patient arrived via EMS and did not have family available to transport her.  Patient also evaluated by Dr. Melina Copa who guided the patient's management and agrees with plan.  Final Clinical Impressions(s) / ED Diagnoses   Final diagnoses:  Avulsion of fingertip, initial encounter    ED Discharge Orders        Ordered    HYDROcodone-acetaminophen ([place]) 5-325 MG tablet     09/23/17 [address], [place] 09/23/17 1923    Hayden Rasmussen, MD 09/24/17 1048

## 2017-09-23 NOTE — Anesthesia Preprocedure Evaluation (Signed)
Anesthesia Evaluation  Patient identified by MRN, date of birth, ID band Patient awake    Reviewed: Allergy & Precautions, H&P , NPO status , Patient's Chart, lab work & pertinent test results  Airway Mallampati: II   Neck ROM: full    Dental   Pulmonary COPD, Current Smoker,    breath sounds clear to auscultation       Cardiovascular hypertension,  Rhythm:regular Rate:Normal     Neuro/Psych  Headaches, PSYCHIATRIC DISORDERS Anxiety    GI/Hepatic GERD  ,  Endo/Other    Renal/GU      Musculoskeletal   Abdominal   Peds  Hematology   Anesthesia Other Findings   Reproductive/Obstetrics                             Anesthesia Physical Anesthesia Plan  ASA: II  Anesthesia Plan: General   Post-op Pain Management:    Induction: Intravenous  PONV Risk Score and Plan: 2 and Ondansetron, Dexamethasone, Midazolam and Treatment may vary due to age or medical condition  Airway Management Planned: LMA  Additional Equipment:   Intra-op Plan:   Post-operative Plan:   Informed Consent: I have reviewed the patients History and Physical, chart, labs and discussed the procedure including the risks, benefits and alternatives for the proposed anesthesia with the patient or authorized representative who has indicated his/her understanding and acceptance.     Plan Discussed with: CRNA, Anesthesiologist and Surgeon  Anesthesia Plan Comments:         Anesthesia Quick Evaluation

## 2017-09-23 NOTE — Anesthesia Procedure Notes (Signed)
Procedure Name: LMA Insertion Performed by: Chandy Tarman W, CRNA Pre-anesthesia Checklist: Patient identified, Emergency Drugs available, Suction available and Patient being monitored Patient Re-evaluated:Patient Re-evaluated prior to induction Oxygen Delivery Method: Circle system utilized Preoxygenation: Pre-oxygenation with 100% oxygen Induction Type: IV induction Ventilation: Mask ventilation without difficulty LMA: LMA inserted LMA Size: 3.0 Number of attempts: 1 Placement Confirmation: positive ETCO2 Tube secured with: Tape Dental Injury: Teeth and Oropharynx as per pre-operative assessment        

## 2017-09-23 NOTE — Discharge Instructions (Addendum)

## 2017-09-23 NOTE — Op Note (Signed)
I assisted Surgeon(s) and Role:    * Betha LoaKuzma, Kevin, MD - Primary    * Cindee SaltKuzma, Carel Schnee, MD - Assisting on the Procedure(s): REVISION AMPUTATION RIGHT INDEX FINGER on 09/23/2017.  I provided assistance on this case as follows: debridement, revision amputation,closure and application of the dressing. Electronically signed by: Nicki ReaperKUZMA,Lawerence Dery R, MD Date: 09/23/2017 Time: 4:07 PM

## 2017-09-24 NOTE — Anesthesia Postprocedure Evaluation (Signed)
Anesthesia Post Note  Patient: Amber Griffith NeedsRosalyn L Grimes  Procedure(s) Performed: REVISION AMPUTATION RIGHT INDEX FINGER (Right Finger)     Patient location during evaluation: PACU Anesthesia Type: General Level of consciousness: awake and alert Pain management: pain level controlled Vital Signs Assessment: post-procedure vital signs reviewed and stable Respiratory status: spontaneous breathing, nonlabored ventilation, respiratory function stable and patient connected to nasal cannula oxygen Cardiovascular status: blood pressure returned to baseline and stable Postop Assessment: no apparent nausea or vomiting Anesthetic complications: no    Last Vitals:  Vitals:   09/23/17 1630 09/23/17 1700  BP: (!) 161/82 (!) 160/82  Pulse: 77 72  Resp: 16 20  Temp:  36.6 C  SpO2: 99% 98%    Last Pain:  Vitals:   09/23/17 1700  TempSrc:   PainSc: 0-No pain                 Luciana Cammarata S

## 2017-09-26 ENCOUNTER — Ambulatory Visit: Payer: Medicare Other | Admitting: Internal Medicine

## 2017-09-27 ENCOUNTER — Encounter (HOSPITAL_BASED_OUTPATIENT_CLINIC_OR_DEPARTMENT_OTHER): Payer: Self-pay | Admitting: Orthopedic Surgery

## 2017-10-21 ENCOUNTER — Other Ambulatory Visit: Payer: Self-pay | Admitting: Internal Medicine

## 2017-10-21 DIAGNOSIS — M8000XA Age-related osteoporosis with current pathological fracture, unspecified site, initial encounter for fracture: Secondary | ICD-10-CM

## 2017-10-23 ENCOUNTER — Other Ambulatory Visit: Payer: Self-pay | Admitting: Internal Medicine

## 2017-10-23 DIAGNOSIS — I10 Essential (primary) hypertension: Secondary | ICD-10-CM

## 2017-10-23 DIAGNOSIS — I517 Cardiomegaly: Secondary | ICD-10-CM

## 2018-01-02 ENCOUNTER — Telehealth: Payer: Self-pay

## 2018-01-02 NOTE — Telephone Encounter (Signed)
Okay to write letter stating pt can not work outside due to COPD?

## 2018-01-02 NOTE — Telephone Encounter (Signed)
Copied from CRM [id number]. Topic: Inquiry >> Jan 02, 2018  3:00 PM Alexander BergeronBarksdale, Harvey B wrote: Reason for CRM: pt states she is needing a letter from her pcp stating since she has COPD she cannot be outside while working, contact pt to advise

## 2018-01-02 NOTE — Telephone Encounter (Signed)
yes

## 2018-01-04 NOTE — Telephone Encounter (Signed)
Letter completed and given to PCP to sign.  

## 2018-01-04 NOTE — Telephone Encounter (Signed)
Called and LVM to informed patient that her letter is ready to be picked up.

## 2018-01-14 ENCOUNTER — Other Ambulatory Visit: Payer: Self-pay | Admitting: Internal Medicine

## 2018-01-14 DIAGNOSIS — I10 Essential (primary) hypertension: Secondary | ICD-10-CM

## 2018-01-14 DIAGNOSIS — I517 Cardiomegaly: Secondary | ICD-10-CM

## 2018-01-16 IMAGING — DX DG LUMBAR SPINE COMPLETE 4+V
5 series · 5 of 5 positions shown · non-contrast
Comparison: None.

CLINICAL DATA: Thoracic and lumbar pain.  No known injury.

EXAM:
LUMBAR SPINE - COMPLETE 4+ VIEW

[l-spine ap]
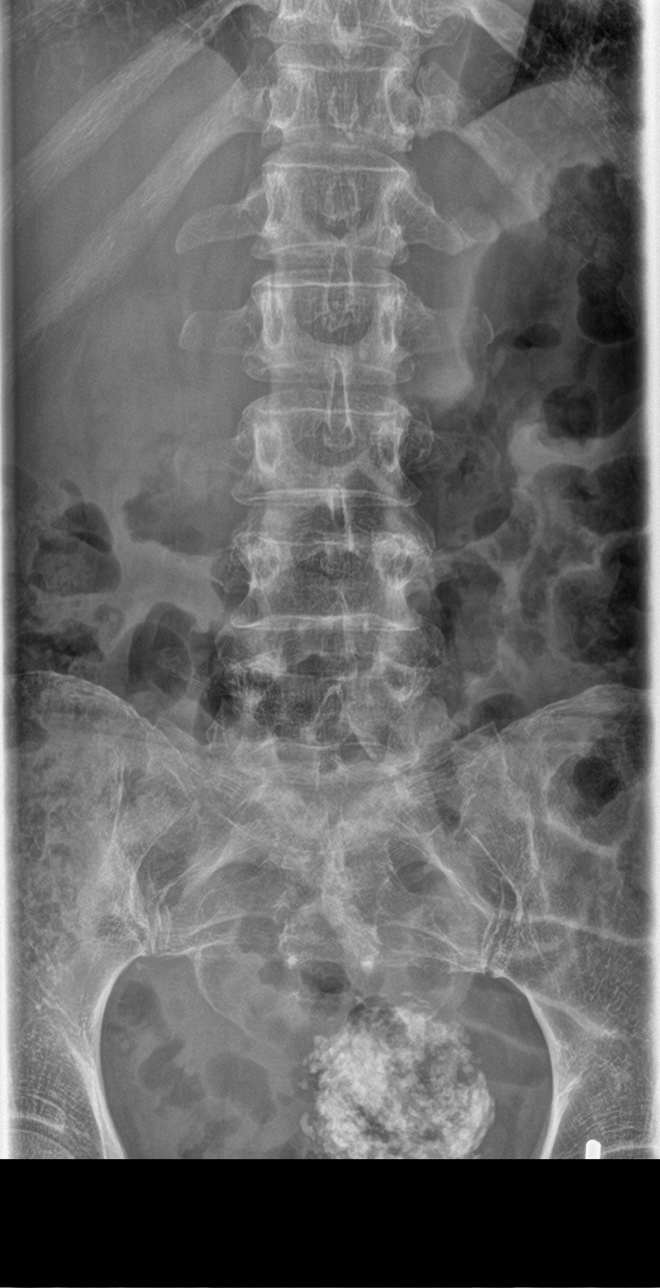

[l-spine obl (1 of 2)]
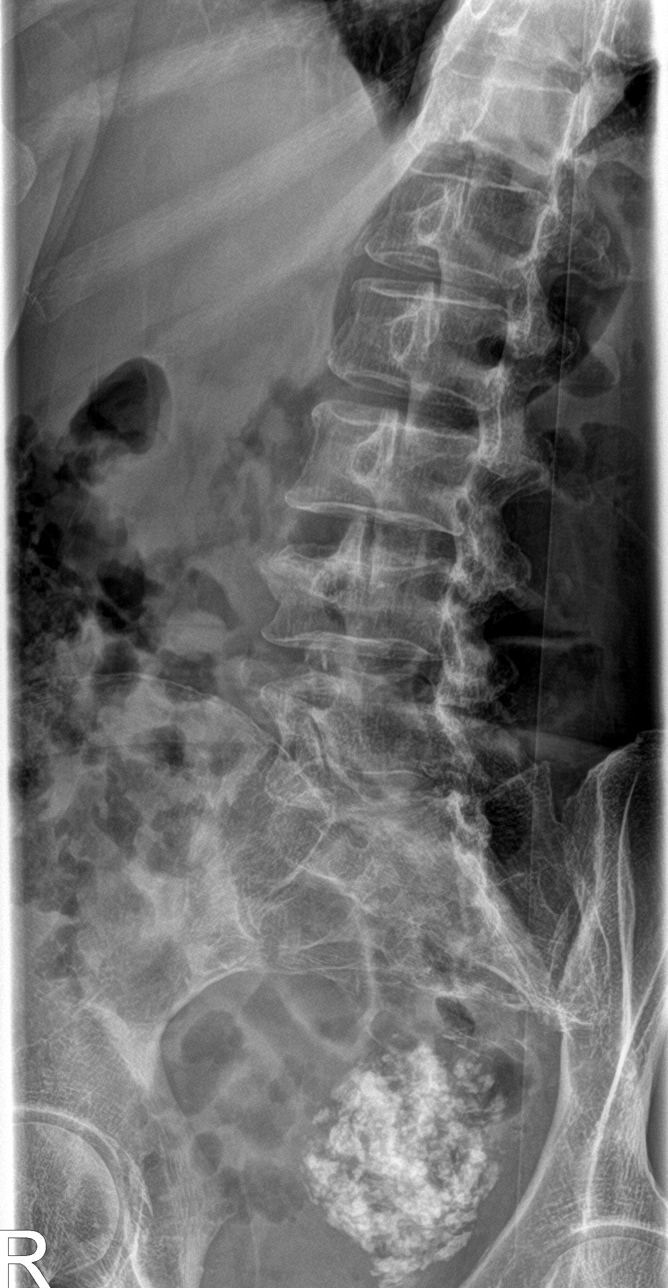

[l-spine obl (2 of 2)]
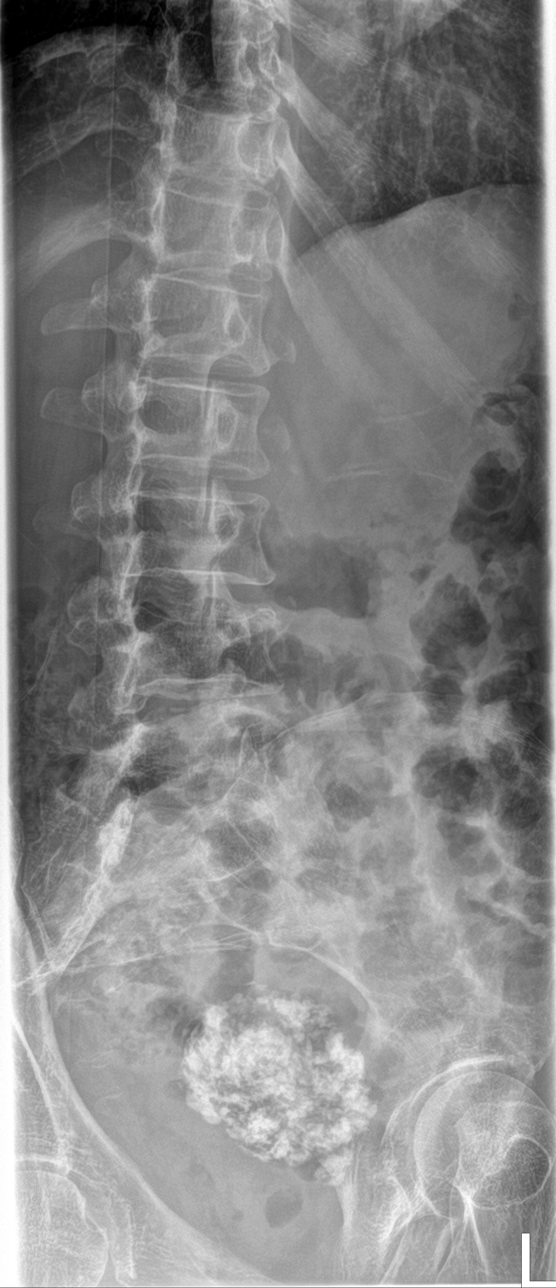

[l-spine lat]
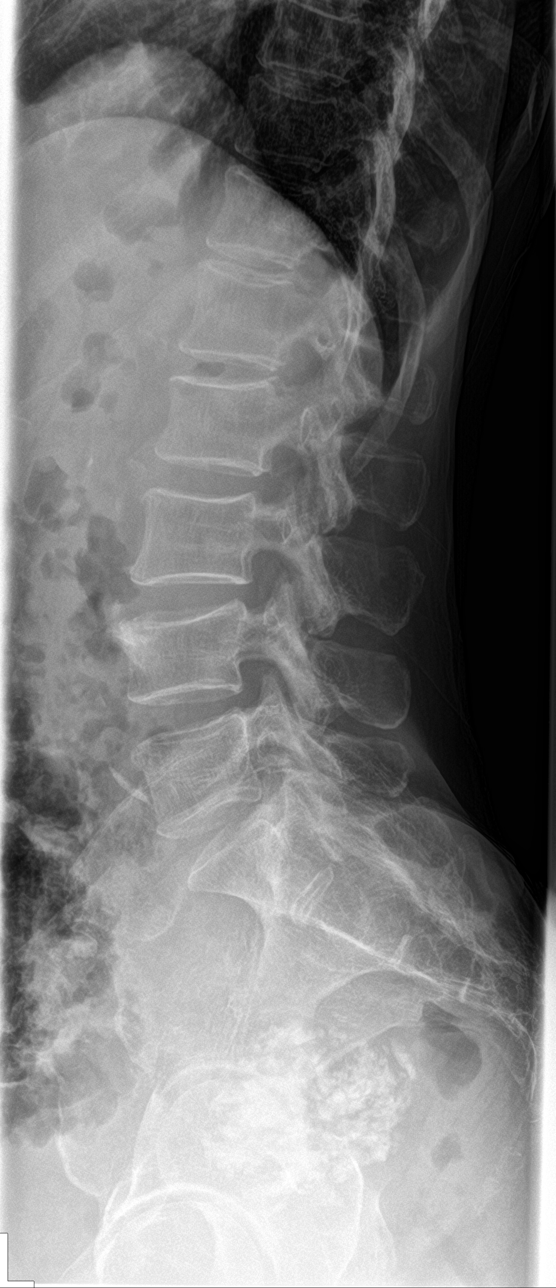

[l-spine spot]
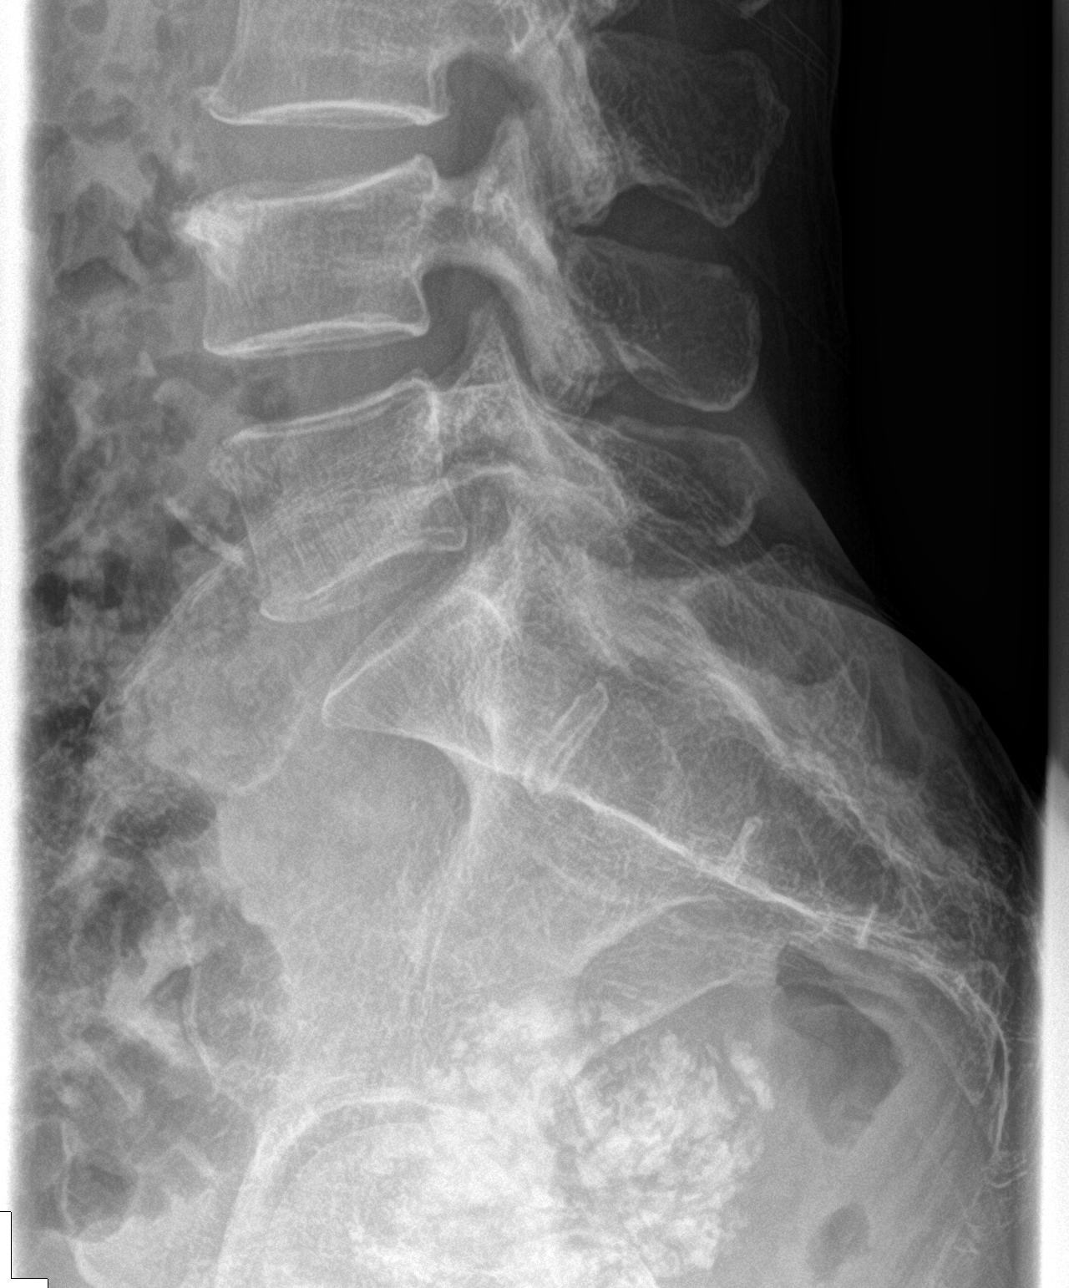

[5 of 5 positions shown; findings below may reference images not displayed]

FINDINGS: Subacute- chronic L4 vertebral body compression fracture with
approximately 10% height loss.

Remainder the vertebral body heights are maintained. Alignment is
normal. Intervertebral disc spaces are maintained.

Large calcified mass in the pelvis most consistent with a uterine
fibroid.
IMPRESSION: 1. Subacute- chronic L4 vertebral body compression fracture with
approximately 10% height loss.

## 2018-01-25 ENCOUNTER — Ambulatory Visit (INDEPENDENT_AMBULATORY_CARE_PROVIDER_SITE_OTHER)
Admission: RE | Admit: 2018-01-25 | Discharge: 2018-01-25 | Disposition: A | Discharge status: Home / Self Care | Payer: Medicare Other | Source: Ambulatory Visit | Attending: Internal Medicine | Admitting: Internal Medicine

## 2018-01-25 ENCOUNTER — Encounter: Payer: Self-pay | Admitting: Internal Medicine

## 2018-01-25 ENCOUNTER — Ambulatory Visit (INDEPENDENT_AMBULATORY_CARE_PROVIDER_SITE_OTHER): Payer: Medicare Other | Admitting: Internal Medicine

## 2018-01-25 ENCOUNTER — Other Ambulatory Visit (INDEPENDENT_AMBULATORY_CARE_PROVIDER_SITE_OTHER): Payer: Medicare Other

## 2018-01-25 ENCOUNTER — Telehealth: Payer: Self-pay

## 2018-01-25 VITALS — BP 142/74 | HR 84 | Temp 97.6°F | Resp 16 | Ht 59.0 in | Wt 87.0 lb

## 2018-01-25 DIAGNOSIS — M8000XA Age-related osteoporosis with current pathological fracture, unspecified site, initial encounter for fracture: Secondary | ICD-10-CM

## 2018-01-25 DIAGNOSIS — E559 Vitamin D deficiency, unspecified: Secondary | ICD-10-CM | POA: Diagnosis not present

## 2018-01-25 DIAGNOSIS — I10 Essential (primary) hypertension: Secondary | ICD-10-CM | POA: Diagnosis not present

## 2018-01-25 DIAGNOSIS — J449 Chronic obstructive pulmonary disease, unspecified: Secondary | ICD-10-CM

## 2018-01-25 DIAGNOSIS — E7800 Pure hypercholesterolemia, unspecified: Secondary | ICD-10-CM

## 2018-01-25 DIAGNOSIS — R05 Cough: Secondary | ICD-10-CM

## 2018-01-25 DIAGNOSIS — Z23 Encounter for immunization: Secondary | ICD-10-CM | POA: Diagnosis not present

## 2018-01-25 DIAGNOSIS — Z0000 Encounter for general adult medical examination without abnormal findings: Secondary | ICD-10-CM

## 2018-01-25 DIAGNOSIS — S99922A Unspecified injury of left foot, initial encounter: Secondary | ICD-10-CM

## 2018-01-25 DIAGNOSIS — R059 Cough, unspecified: Secondary | ICD-10-CM

## 2018-01-25 DIAGNOSIS — Z124 Encounter for screening for malignant neoplasm of cervix: Secondary | ICD-10-CM

## 2018-01-25 DIAGNOSIS — M810 Age-related osteoporosis without current pathological fracture: Secondary | ICD-10-CM

## 2018-01-25 DIAGNOSIS — Z1231 Encounter for screening mammogram for malignant neoplasm of breast: Secondary | ICD-10-CM

## 2018-01-25 LAB — URINALYSIS, ROUTINE W REFLEX MICROSCOPIC
Bilirubin Urine: NEGATIVE
Ketones, ur: NEGATIVE
Leukocytes, UA: NEGATIVE
NITRITE: NEGATIVE
SPECIFIC GRAVITY, URINE: 1.015 (ref 1.000–1.030)
TOTAL PROTEIN, URINE-UPE24: NEGATIVE
URINE GLUCOSE: NEGATIVE
Urobilinogen, UA: 0.2 (ref 0.0–1.0)
pH: 5.5 (ref 5.0–8.0)

## 2018-01-25 LAB — CBC WITH DIFFERENTIAL/PLATELET
BASOS ABS: 0.1 10*3/uL (ref 0.0–0.1)
Basophils Relative: 1.2 % (ref 0.0–3.0)
EOS ABS: 0.1 10*3/uL (ref 0.0–0.7)
Eosinophils Relative: 1.6 % (ref 0.0–5.0)
HEMATOCRIT: 38.8 % (ref 36.0–46.0)
HEMOGLOBIN: 12.9 g/dL (ref 12.0–15.0)
LYMPHS PCT: 39 % (ref 12.0–46.0)
Lymphs Abs: 3.1 10*3/uL (ref 0.7–4.0)
MCHC: 33.2 g/dL (ref 30.0–36.0)
MCV: 90.4 fl (ref 78.0–100.0)
Monocytes Absolute: 0.7 10*3/uL (ref 0.1–1.0)
Monocytes Relative: 9.4 % (ref 3.0–12.0)
Neutro Abs: 3.8 10*3/uL (ref 1.4–7.7)
Neutrophils Relative %: 48.8 % (ref 43.0–77.0)
Platelets: 254 10*3/uL (ref 150.0–400.0)
RBC: 4.3 Mil/uL (ref 3.87–5.11)
RDW: 12.6 % (ref 11.5–15.5)
WBC: 7.9 10*3/uL (ref 4.0–10.5)

## 2018-01-25 LAB — LIPID PANEL
Cholesterol: 179 mg/dL (ref 0–200)
HDL: 57.5 mg/dL (ref >39.00)
NonHDL: 121.76
Total CHOL/HDL Ratio: 3
Triglycerides: 223 mg/dL — ABNORMAL HIGH (ref 0.0–149.0)
VLDL: 44.6 mg/dL — AB (ref 0.0–40.0)

## 2018-01-25 LAB — LDL CHOLESTEROL, DIRECT: Direct LDL: 84 mg/dL

## 2018-01-25 LAB — VITAMIN D 25 HYDROXY (VIT D DEFICIENCY, FRACTURES): VITD: 45.29 ng/mL (ref 30.00–100.00)

## 2018-01-25 LAB — TSH: TSH: 0.8 u[IU]/mL (ref 0.35–4.50)

## 2018-01-25 MED ORDER — FLUTICASONE-UMECLIDIN-VILANT 100-62.5-25 MCG/INH IN AEPB: 1.0000 | INHALATION_SPRAY | Freq: Every day | RESPIRATORY_TRACT | 1 refills | Status: DC

## 2018-01-25 MED ORDER — ROSUVASTATIN CALCIUM 5 MG PO TABS: 5.0000 mg | ORAL_TABLET | Freq: Every day | ORAL | 1 refills | Status: DC

## 2018-01-25 NOTE — Telephone Encounter (Signed)
Pt needs to start prolia or evenity for osteoporosis/current fx per dr jones---pt needs to bring [employer] insurance card to our office so that I can check on summary of benefits for both meds---can talk with tamara,RN at [facility] office if needed

## 2018-01-25 NOTE — Progress Notes (Signed)
Subjective:  Patient ID: Amber Griffith, female    DOB: 1950-10-08  Age: 67 y.o. MRN: [id number]  CC: Annual Exam; Hypertension; Foot Injury; and Hyperlipidemia   HPI Amber Griffith presents for a CPX.  She complains that she injured her left foot about a week ago.  She was walking in her house and stubbed it against a piece of furniture.  She has persistent pain and swelling but she can bear weight on the left foot.  She tells me her blood pressure has been well controlled.  She denies any recent episodes of DOE, CP, SOB, palpitations, edema, or fatigue.  Outpatient Medications Prior to Visit  Medication Sig Dispense Refill  . aspirin 81 MG tablet Take 81 mg by mouth daily.      . chlorthalidone (HYGROTON) 25 MG tablet TAKE 1 TABLET BY MOUTH EVERY DAY 90 tablet 0  . esomeprazole (NEXIUM) 40 MG packet Take 40 mg by mouth as needed. For heartburn    . Vitamin D, Ergocalciferol, (DRISDOL) 50000 units CAPS capsule TAKE ONE CAPSULE BY MOUTH EVERY 7 DAYS 4 capsule 5  . Fluticasone-Umeclidin-Vilant (TRELEGY ELLIPTA) 100-62.5-25 MCG/INH AEPB Inhale 1 puff daily into the lungs. 90 each 1  . Glycopyrrolate (LONHALA MAGNAIR REFILL KIT) 25 MCG/ML SOLN Inhale 1 Act 2 (two) times daily into the lungs. 60 mL 11  . HYDROcodone-acetaminophen ([place]) 5-325 MG tablet 1-2 tabs po q6 hours prn pain 30 tablet 0  . Glycopyrrolate (LONHALA MAGNAIR STARTER KIT) 25 MCG/ML SOLN Inhale 1 Act 2 (two) times daily into the lungs. 60 mL 11  . promethazine-dextromethorphan (PROMETHAZINE-DM) 6.25-15 MG/5ML syrup Take 5 mLs by mouth 4 (four) times daily as needed for cough. 118 mL 0  . sulfamethoxazole-trimethoprim (BACTRIM DS,SEPTRA DS) 800-160 MG tablet Take 1 tablet 2 (two) times daily by mouth. 20 tablet 0   No facility-administered medications prior to visit.     ROS Review of Systems  Constitutional: Negative.  Negative for chills, diaphoresis, fatigue and fever.  HENT: Negative.   Eyes: Negative.   Negative for visual disturbance.  Respiratory: Negative for cough, chest tightness, shortness of breath and wheezing.   Cardiovascular: Negative for chest pain, palpitations and leg swelling.  Gastrointestinal: Negative for abdominal pain, constipation, diarrhea and vomiting.  Endocrine: Negative.   Genitourinary: Negative.  Negative for difficulty urinating.  Musculoskeletal: Positive for arthralgias. Negative for back pain, myalgias and neck pain.  Skin: Negative.  Negative for color change, pallor, rash and wound.  Neurological: Negative.  Negative for dizziness, weakness, light-headedness and numbness.  Hematological: Negative for adenopathy. Does not bruise/bleed easily.  Psychiatric/Behavioral: Negative.     Objective:  BP (!) 142/74 (BP Location: Left Arm, Patient Position: Sitting, Cuff Size: Normal)   Pulse 84   Temp 97.6 F (36.4 C) (Oral)   Resp 16   Ht 4' 11"  (1.499 m)   Wt 87 lb (39.5 kg)   SpO2 96%   BMI 17.57 kg/m   BP Readings from Last 3 Encounters:  01/25/18 (!) 142/74  09/23/17 (!) 160/82  06/09/17 140/70    Wt Readings from Last 3 Encounters:  01/25/18 87 lb (39.5 kg)  09/23/17 85 lb (38.6 kg)  06/09/17 81 lb (36.7 kg)    Physical Exam  Constitutional: She is oriented to person, place, and time. No distress.  HENT:  Mouth/Throat: Oropharynx is clear and moist. No oropharyngeal exudate.  Eyes: Conjunctivae are normal. No scleral icterus.  Neck: Normal range of motion. Neck supple. No JVD  present. No thyromegaly present.  Cardiovascular: Normal rate, regular rhythm and normal heart sounds. Exam reveals no friction rub.  No murmur heard. Pulmonary/Chest: Effort normal. No stridor. No tachypnea. No respiratory distress. She has decreased breath sounds in the right upper field, the right middle field, the right lower field, the left upper field, the left middle field and the left lower field. She has no wheezes. She has no rhonchi. She has no rales.    Abdominal: Soft. Normal appearance and bowel sounds are normal. She exhibits no mass. There is no hepatosplenomegaly. There is no tenderness. No hernia.  Musculoskeletal:       Left foot: There is tenderness, bony tenderness and swelling. There is normal range of motion, no crepitus and no deformity.       Feet:  Lymphadenopathy:    She has no cervical adenopathy.  Neurological: She is alert and oriented to person, place, and time.  Skin: Skin is warm and dry. She is not diaphoretic. No pallor.  Vitals reviewed.   Lab Results  Component Value Date   WBC 7.9 01/25/2018   HGB 12.9 01/25/2018   HCT 38.8 01/25/2018   PLT 254.0 01/25/2018   GLUCOSE 93 08/31/2016   CHOL 179 01/25/2018   TRIG 223.0 (H) 01/25/2018   HDL 57.50 01/25/2018   LDLDIRECT 84.0 01/25/2018   LDLCALC 83 08/31/2016   ALT 15 08/31/2016   AST 15 08/31/2016   NA 143 08/31/2016   K 3.9 08/31/2016   CL 110 08/31/2016   CREATININE 0.68 08/31/2016   BUN 12 08/31/2016   CO2 29 08/31/2016   TSH 0.80 01/25/2018   HGBA1C 4.9 07/30/2013    Dg Finger Index Right  Result Date: 09/23/2017 CLINICAL DATA:  Right index finger injury, caught in a laundry press. Initial encounter. EXAM: RIGHT INDEX FINGER 2+V COMPARISON:  None. FINDINGS: Bandage material is noted about the index finger with soft tissue deficiency involving the distal aspect of the finger. Bandage material limits assessment of fine osseous detail. No displaced fracture or dislocation is identified. No radiopaque foreign body is seen. IMPRESSION: Index finger soft tissue injury without evidence of displaced fracture or dislocation. Electronically Signed   By: Logan Bores M.D.   On: 09/23/2017 11:45    Assessment & Plan:   Keli was seen today for annual exam, hypertension, foot injury and hyperlipidemia.  Diagnoses and all orders for this visit:  Essential hypertension- Her blood pressure is adequately well controlled.  Labs are negative for secondary  causes or endorgan damage. -     CBC with Differential/Platelet; Future -     TSH; Future -     Urinalysis, Routine w reflex microscopic; Future  Routine general medical examination at a health care facility- Exam completed, labs reviewed, vaccines reviewed, screening for colon cancer is up-to-date, her Pap smear is up-to-date, I have ordered a mammogram, patient education material was given. -     Lipid panel; Future  Vitamin D deficiency-we will monitor her vitamin D level. -     VITAMIN D 25 Hydroxy (Vit-D Deficiency, Fractures); Future  Cervical cancer screening  Visit for screening mammogram -     MM DIGITAL SCREENING BILATERAL; Future  Age-related osteoporosis with current pathological fracture, initial encounter- She is due for a follow-up DEXA scan.  I have tentatively recommended that she start receiving PROLIA every 6 months. -     VITAMIN D 25 Hydroxy (Vit-D Deficiency, Fractures); Future -     DG Bone Density; Future  Foot injury, left, initial encounter- Symptoms, exam, and the normal plain x-ray are consistent with a contusion.  I have asked her to rest, ice, elevate as much as possible. -     DG Foot Complete Left; Future  Cough  COPD (chronic obstructive pulmonary disease) with chronic bronchitis ([place]) -     Fluticasone-Umeclidin-Vilant (TRELEGY ELLIPTA) 100-62.5-25 MCG/INH AEPB; Inhale 1 puff into the lungs daily.  Need for pneumococcal vaccination -     Pneumococcal conjugate vaccine 13-valent  Pure hypercholesterolemia -     rosuvastatin (CRESTOR) 5 MG tablet; Take 1 tablet (5 mg total) by mouth daily.   I have discontinued Ginni L. Cherubini's promethazine-dextromethorphan, Glycopyrrolate, Glycopyrrolate, sulfamethoxazole-trimethoprim, and HYDROcodone-acetaminophen. I have also changed her Fluticasone-Umeclidin-Vilant. Additionally, I am having her start on rosuvastatin. Lastly, I am having her maintain her aspirin, esomeprazole, Vitamin D (Ergocalciferol), and  chlorthalidone.  Meds ordered this encounter  Medications  . Fluticasone-Umeclidin-Vilant (TRELEGY ELLIPTA) 100-62.5-25 MCG/INH AEPB    Sig: Inhale 1 puff into the lungs daily.    Dispense:  90 each    Refill:  1  . rosuvastatin (CRESTOR) 5 MG tablet    Sig: Take 1 tablet (5 mg total) by mouth daily.    Dispense:  90 tablet    Refill:  1     Follow-up: No follow-ups on file.  Scarlette Calico, MD

## 2018-02-01 ENCOUNTER — Telehealth: Payer: Self-pay

## 2018-02-01 NOTE — Telephone Encounter (Signed)
Patient did not understand message, requesting call back [phone]

## 2018-02-01 NOTE — Telephone Encounter (Signed)
Pt is requesting lab results. Please advise.

## 2018-02-01 NOTE — Telephone Encounter (Signed)
Lab Results  Component Value Date   WBC 7.9 01/25/2018   HGB 12.9 01/25/2018   HCT 38.8 01/25/2018   PLT 254.0 01/25/2018   GLUCOSE 93 08/31/2016   CHOL 179 01/25/2018   TRIG 223.0 (H) 01/25/2018   HDL 57.50 01/25/2018   LDLDIRECT 84.0 01/25/2018   LDLCALC 83 08/31/2016   ALT 15 08/31/2016   AST 15 08/31/2016   NA 143 08/31/2016   K 3.9 08/31/2016   CL 110 08/31/2016   CREATININE 0.68 08/31/2016   BUN 12 08/31/2016   CO2 29 08/31/2016   TSH 0.80 01/25/2018   HGBA1C 4.9 07/30/2013    Her triglycerides were little too high but the other labs are all okay.

## 2018-02-01 NOTE — Telephone Encounter (Signed)
Left detailed message stating the lab results.

## 2018-02-01 NOTE — Telephone Encounter (Signed)
Copied from CRM [id number]. Topic: Quick Communication - Lab Results >> Jan 30, 2018  2:10 PM Alexander BergeronBarksdale, Harvey B wrote: Call pt when labs have been released

## 2018-02-03 NOTE — Telephone Encounter (Signed)
LVM for pt to call back as soon as possible.   RE: Please get me when pt calls back.  

## 2018-02-10 ENCOUNTER — Other Ambulatory Visit: Payer: Self-pay | Admitting: Internal Medicine

## 2018-02-10 DIAGNOSIS — I10 Essential (primary) hypertension: Secondary | ICD-10-CM

## 2018-02-10 DIAGNOSIS — I517 Cardiomegaly: Secondary | ICD-10-CM

## 2018-03-24 ENCOUNTER — Ambulatory Visit
Admission: RE | Admit: 2018-03-24 | Discharge: 2018-03-24 | Disposition: A | Discharge status: Home / Self Care | Payer: Medicare Other | Source: Ambulatory Visit | Attending: Internal Medicine | Admitting: Internal Medicine

## 2018-03-24 DIAGNOSIS — Z1231 Encounter for screening mammogram for malignant neoplasm of breast: Secondary | ICD-10-CM

## 2018-03-24 LAB — HM MAMMOGRAPHY

## 2018-04-11 ENCOUNTER — Other Ambulatory Visit: Payer: Self-pay | Admitting: Internal Medicine

## 2018-04-11 DIAGNOSIS — I517 Cardiomegaly: Secondary | ICD-10-CM

## 2018-04-11 DIAGNOSIS — I10 Essential (primary) hypertension: Secondary | ICD-10-CM

## 2018-04-14 ENCOUNTER — Telehealth: Payer: Self-pay | Admitting: Internal Medicine

## 2018-04-14 ENCOUNTER — Encounter: Payer: Self-pay | Admitting: Internal Medicine

## 2018-04-14 NOTE — Telephone Encounter (Signed)
[employer] is needing medical necessity for Evenity.  °We have been working on this for quite a while. °I have faxed over the letter of medical necessity with clinical documentation and injection information to [employer].  °If [employer] calls they can be routed to the office to speak with me (Carson) or Tamara if I am out of the office.  °

## 2018-04-14 NOTE — Telephone Encounter (Signed)
This case was closed because they did not receive all of the information that was needed originally.  Contacted [employer] to redo the claim authorization so that we can resubmit the information for clinical review.   Spoke with someone who states this is bring resubmitted for review. Authorization # [id number]  Ref # [id number]. Someone will contact the office with information on where to send letter and clinical notes.

## 2018-04-24 NOTE — Telephone Encounter (Signed)
[facility]mber [facility] from [facility] returned call, requesting a call back at [phone].

## 2018-04-25 NOTE — Telephone Encounter (Addendum)
Horace Porteousenise Cooper with [facility] called and states we can fax the letter of medical necessity and clinical notes for Evenity to fax# [phone]. Denise direct line if you need information or help is  Phone # [phone].   Angelique BlonderDenise states there are 2 days left on this case.

## 2018-04-27 NOTE — Telephone Encounter (Signed)
Authorization for [id number] was denied. Doctor needs to send records to review or discuss treatment. Medical necessity was not provided.

## 2018-04-30 ENCOUNTER — Other Ambulatory Visit: Payer: Self-pay | Admitting: Internal Medicine

## 2018-04-30 DIAGNOSIS — M8000XA Age-related osteoporosis with current pathological fracture, unspecified site, initial encounter for fracture: Secondary | ICD-10-CM

## 2018-05-05 NOTE — Telephone Encounter (Signed)
I will be re-running insurance for prolia benefit coverage, evenity is not looking probable 

## 2018-05-08 ENCOUNTER — Ambulatory Visit: Payer: Medicare Other | Admitting: Family

## 2018-05-08 ENCOUNTER — Encounter: Payer: Self-pay | Admitting: Family

## 2018-05-08 VITALS — BP 140/70 | HR 73 | Temp 98.1°F | Ht 59.0 in | Wt 88.0 lb

## 2018-05-08 DIAGNOSIS — R062 Wheezing: Secondary | ICD-10-CM | POA: Diagnosis not present

## 2018-05-08 DIAGNOSIS — J441 Chronic obstructive pulmonary disease with (acute) exacerbation: Secondary | ICD-10-CM

## 2018-05-08 MED ORDER — PREDNISONE 20 MG PO TABS: 20.0000 mg | ORAL_TABLET | Freq: Every day | ORAL | 0 refills | Status: DC

## 2018-05-08 MED ORDER — DOXYCYCLINE HYCLATE 100 MG PO TABS: 100.0000 mg | ORAL_TABLET | Freq: Two times a day (BID) | ORAL | 0 refills | Status: DC

## 2018-05-08 MED ORDER — IPRATROPIUM-ALBUTEROL 0.5-2.5 (3) MG/3ML IN SOLN
3.0000 mL | Freq: Once | RESPIRATORY_TRACT | Status: AC
  Administered 2018-05-08: 3 mL via RESPIRATORY_TRACT

## 2018-05-08 NOTE — Progress Notes (Signed)
Amber Griffith is a 67 y.o. female with the following history as recorded in Epic[employer]:  Patient Active Problem List   Diagnosis Date Noted  . Foot injury, left, initial encounter 01/25/2018  . Vitamin D deficiency 08/31/2016  . Cervical cancer screening 08/30/2016  . Visit for screening mammogram 08/30/2016  . Chronic midline thoracic back pain 08/30/2016  . Chronic low back pain without sciatica 06/23/2015  . Compression fracture of lumbar spine, non-traumatic (HCC) 06/23/2015  . Osteoporosis 06/23/2015  . Other abnormal glucose 07/30/2013  . Atopic dermatitis 11/10/2012  . Pure hypercholesterolemia 03/01/2012  . Hemorrhoids without complication 03/01/2012  . Anxiety and depression 12/24/2010  . Insomnia 12/24/2010  . Routine general medical examination at a health care facility 12/24/2010  . Cardiomegaly 01/17/2009  . CONSTIPATION, CHRONIC 12/18/2008  . ALLERGIC RHINITIS 09/01/2007  . Essential hypertension 06/12/2007  . TOBACCO ABUSE 04/04/2007  . COPD (chronic obstructive pulmonary disease) with chronic bronchitis (HCC) 04/04/2007  . GERD 04/04/2007    Current Outpatient Medications  Medication Sig Dispense Refill  . aspirin 81 MG tablet Take 81 mg by mouth daily.      . chlorthalidone (HYGROTON) 25 MG tablet TAKE 1 TABLET BY MOUTH EVERY DAY 90 tablet 0  . esomeprazole (NEXIUM) 40 MG packet Take 40 mg by mouth as needed. For heartburn    . Fluticasone-Umeclidin-Vilant (TRELEGY ELLIPTA) 100-62.5-25 MCG/INH AEPB Inhale 1 puff into the lungs daily. 90 each 1  . rosuvastatin (CRESTOR) 5 MG tablet Take 1 tablet (5 mg total) by mouth daily. 90 tablet 1  . Vitamin D, Ergocalciferol, (DRISDOL) 50000 units CAPS capsule TAKE ONE CAPSULE BY MOUTH EVERY 7 DAYS 12 capsule 1  . doxycycline (VIBRA-TABS) 100 MG tablet Take 1 tablet (100 mg total) by mouth 2 (two) times daily. 20 tablet 0  . predniSONE (DELTASONE) 20 MG tablet Take 1 tablet (20 mg total) by mouth daily with breakfast. 5  tablet 0   No current facility-administered medications for this visit.     Allergies: Patient has no known allergies.  Past Medical History:  Diagnosis Date  . ALLERGIC RHINITIS 09/01/2007  . Anxiety and depression 12/24/2010  . Cardiomegaly 01/17/2009  . CONSTIPATION, CHRONIC 12/18/2008  . COPD 04/04/2007  . ELECTROCARDIOGRAM, ABNORMAL 01/17/2009  . GERD 04/04/2007  . HEADACHE, TENSION 04/18/2008  . HEMORRHOIDS 09/25/2007  . HYPERTENSION 06/12/2007  . SINUSITIS- ACUTE-NOS 05/08/2010  . TOBACCO ABUSE 04/04/2007  . UTI 09/30/2010  . Vaginitis and vulvovaginitis, unspecified 09/30/2010    Past Surgical History:  Procedure Laterality Date  . AMPUTATION Right 09/23/2017   Procedure: REVISION AMPUTATION RIGHT INDEX FINGER;  Surgeon: Betha Loa, MD;  Location: [facility];  Service: Orthopedics;  Laterality: Right;  . BREAST BIOPSY Left   . s/p breast bx     left  . s/p umbilical hernia    . TONSILLECTOMY    . TUBAL LIGATION      Family History  Problem Relation Age of Onset  . Asthma Mother   . Hypertension Mother   . COPD Maternal Uncle   . Hypertension Father   . Seizures Father   . Stroke Paternal Uncle     Social History   Tobacco Use  . Smoking status: Current Every Day Smoker    Packs/day: 1.00    Years: 35.00    Pack years: 35.00    Types: Cigarettes  . Smokeless tobacco: Never Used  Substance Use Topics  . Alcohol use: Yes    Comment: 2  cans per day    Subjective:  Patient started last week with cough/ congestion; notes that has felt feverish in the past few days; using OTC Coricidin HBP; using Trelegy sporadically; + smoker; + wheezing;    Objective:  Vitals:   05/08/18 1409  BP: 140/70  Pulse: 73  Temp: 98.1 F (36.7 C)  TempSrc: Oral  SpO2: 97%  Weight: 88 lb (39.9 kg)  Height: 4\' 11"  (1.499 m)    General: Well developed, well nourished, in no acute distress  Skin : Warm and dry.  Head: Normocephalic and atraumatic  Eyes: Sclera and  conjunctiva clear; pupils round and reactive to light; extraocular movements intact  Ears: External normal; canals clear; tympanic membranes normal  Oropharynx: Pink, supple. No suspicious lesions  Neck: Supple without thyromegaly, adenopathy  Lungs: Respirations unlabored; wheezing noted in all 4 lobes  CVS exam: normal rate and regular rhythm.  Neurologic: Alert and oriented; speech intact; face symmetrical; moves all extremities well; CNII-XII intact without focal deficit   Assessment:  1. Wheezing   2. COPD with acute exacerbation (HCC)     Plan:  Duo-neb given in office with some benefit; Rx for Prednisone and Doxycycline; encouraged to use her Trelegy everyday; she knows she needs to quit smoking; work note given for today; increase fluids, rest and follow-up worse, no better.   No follow-ups on file.  No orders of the defined types were placed in this encounter.   Requested Prescriptions   Signed Prescriptions Disp Refills  . doxycycline (VIBRA-TABS) 100 MG tablet 20 tablet 0    Sig: Take 1 tablet (100 mg total) by mouth 2 (two) times daily.  . predniSONE (DELTASONE) 20 MG tablet 5 tablet 0    Sig: Take 1 tablet (20 mg total) by mouth daily with breakfast.

## 2018-05-11 ENCOUNTER — Telehealth: Payer: Self-pay | Admitting: Internal Medicine

## 2018-05-11 DIAGNOSIS — R05 Cough: Secondary | ICD-10-CM

## 2018-05-11 DIAGNOSIS — R053 Chronic cough: Secondary | ICD-10-CM

## 2018-05-11 NOTE — Telephone Encounter (Signed)
Copied from CRM [id number]. Topic: Quick Communication - See Telephone Encounter >> May 11, 2018  3:01 PM Lorayne Bender wrote: CRM for notification. See Telephone encounter for: 05/11/18.  Pt calling:  states she is doing no better since office visit.  Pt wants to know what to do. Pt can be reached at [phone]

## 2018-05-11 NOTE — Telephone Encounter (Signed)
Spoke with patient. Patient seen by Amber Griffith on 10/7. She has not finished all of her medication yet. She states she doesn't feel worse but coughing more - clear mucus up. She is okay with waiting through the weekend to give the medication time to work. Please advise if you are okay with that or would like for her to do something else. Thank you.

## 2018-05-12 ENCOUNTER — Ambulatory Visit (INDEPENDENT_AMBULATORY_CARE_PROVIDER_SITE_OTHER)
Admission: RE | Admit: 2018-05-12 | Discharge: 2018-05-12 | Disposition: A | Discharge status: Home / Self Care | Payer: Medicare Other | Source: Ambulatory Visit | Attending: Family | Admitting: Family

## 2018-05-12 DIAGNOSIS — R05 Cough: Secondary | ICD-10-CM

## 2018-05-12 DIAGNOSIS — R053 Chronic cough: Secondary | ICD-10-CM

## 2018-05-12 NOTE — Telephone Encounter (Signed)
Spoke with patient and info given 

## 2018-05-12 NOTE — Telephone Encounter (Signed)
I am going to put in an order for her to come get a CXR. Please try to do today.

## 2018-05-12 NOTE — Telephone Encounter (Signed)
See below- not overly concerned with what she is describing let's make sure the CXR is clear.

## 2018-05-15 ENCOUNTER — Other Ambulatory Visit: Payer: Self-pay | Admitting: Family

## 2018-05-15 ENCOUNTER — Telehealth: Payer: Self-pay | Admitting: Internal Medicine

## 2018-05-15 DIAGNOSIS — J988 Other specified respiratory disorders: Secondary | ICD-10-CM

## 2018-05-15 MED ORDER — PROMETHAZINE-DM 6.25-15 MG/5ML PO SYRP: 5.0000 mL | ORAL_SOLUTION | Freq: Four times a day (QID) | ORAL | 0 refills | Status: DC | PRN

## 2018-05-15 NOTE — Telephone Encounter (Signed)
Copied from CRM [id number]. Topic: Quick Communication - See Telephone Encounter >> May 15, 2018  9:56 AM Jay Schlichter wrote: CRM for notification. See Telephone encounter for: 05/15/18. Pt had xray on Friday 05-12-18. She would like the results, pt also states that she would like cough syrup as she has been coughing all weekend.  Please call [phone]

## 2018-05-23 ENCOUNTER — Telehealth: Payer: Self-pay | Admitting: Internal Medicine

## 2018-05-23 MED ORDER — ESOMEPRAZOLE MAGNESIUM 40 MG PO PACK: 40.0000 mg | PACK | ORAL | 5 refills | Status: DC | PRN

## 2018-05-23 NOTE — Telephone Encounter (Signed)
Copied from CRM [id number]. Topic: General - Other >> May 23, 2018 11:41 AM Leafy Ro wrote: Reason for CRM:pt is calling and she had samples of nexium 40 mg. Pt would like new rx nexium send to [address]

## 2018-06-02 ENCOUNTER — Ambulatory Visit (INDEPENDENT_AMBULATORY_CARE_PROVIDER_SITE_OTHER): Payer: Medicare Other | Admitting: *Deleted

## 2018-06-02 DIAGNOSIS — M810 Age-related osteoporosis without current pathological fracture: Secondary | ICD-10-CM

## 2018-06-02 MED ORDER — DENOSUMAB 60 MG/ML ~~LOC~~ SOSY
60.0000 mg | PREFILLED_SYRINGE | Freq: Once | SUBCUTANEOUS | Status: AC
  Administered 2018-06-02: 60 mg via SUBCUTANEOUS

## 2018-06-02 MED ORDER — ESOMEPRAZOLE MAGNESIUM 40 MG PO PACK: 40.0000 mg | PACK | ORAL | 5 refills | Status: DC | PRN

## 2018-06-02 NOTE — Addendum Note (Signed)
Addended by: Deatra James on: 06/02/2018 11:00 AM   Modules accepted: Orders

## 2018-06-02 NOTE — Progress Notes (Signed)
Pt states [facility]. Her copay is $42 wanting to know if we had any samples. Inform pt since med come in generic we no longer get samples. Can send [facility] to see if price is cheaper. [facility]

## 2018-07-07 ENCOUNTER — Ambulatory Visit: Payer: Self-pay

## 2018-07-07 ENCOUNTER — Ambulatory Visit (INDEPENDENT_AMBULATORY_CARE_PROVIDER_SITE_OTHER): Payer: Medicare Other

## 2018-07-07 DIAGNOSIS — Z23 Encounter for immunization: Secondary | ICD-10-CM | POA: Diagnosis not present

## 2018-07-07 NOTE — Telephone Encounter (Signed)
Pt called with C/O a red area with a egg size knot to her rt lower leg.  She states she noticed this today when she woke up and it has been getting red and itching. She is unable to see a bite mark. She has been cleansing the area with alcohol. She thinks it looks infected. She had no hives or SOB.The area is painful but she states the itch is much worse. Per protocol pt will be seen tomorrow at the Saturday clinic. Care advice read to patient. Pt verbalized understanding of all instructions.  Reason for Disposition . [1] Red or very tender (to touch) area AND [2] started over 24 hours after the bite  Answer Assessment - Initial Assessment Questions 1. ONSET: "When did the swelling start?" (e.g., minutes, hours, days)     today 2. LOCATION: "What part of the leg is swollen?"  "Are both legs swollen or just one leg?"     Bottom of leg right leg 3. SEVERITY: "How bad is the swelling?" (e.g., localized; mild, moderate, severe)  - Localized - small area of swelling localized to one leg  - MILD pedal edema - swelling limited to foot and ankle, pitting edema < 1/4 inch (6 mm) deep, rest and elevation eliminate most or all swelling  - MODERATE edema - swelling of lower leg to knee, pitting edema > 1/4 inch (6 mm) deep, rest and elevation only partially reduce swelling  - SEVERE edema - swelling extends above knee, facial or hand swelling present      Egg size 4. REDNESS: "Does the swelling look red or infected?"     Red and infected 5. PAIN: "Is the swelling painful to touch?" If so, ask: "How painful is it?"   (Scale 1-10; mild, moderate or severe)     2 itching more than pain 6. FEVER: "Do you have a fever?" If so, ask: "What is it, how was it measured, and when did it start?"      no 7. CAUSE: "What do you think is causing the leg swelling?"     Bug bite  8. MEDICAL HISTORY: "Do you have a history of heart failure, kidney disease, liver failure, or cancer?"     no 9. RECURRENT SYMPTOM: "Have  you had leg swelling before?" If so, ask: "When was the last time?" "What happened that time?"     no 10. OTHER SYMPTOMS: "Do you have any other symptoms?" (e.g., chest pain, difficulty breathing)       no 11. PREGNANCY: "Is there any chance you are pregnant?" "When was your last menstrual period?"       No  Answer Assessment - Initial Assessment Questions 1. TYPE of INSECT: "What type of insect was it?"      unsure 2. ONSET: "When did you get bitten?"      Today about this morning 3. LOCATION: "Where is the insect bite located?"      Right lower leg 4. REDNESS: "Is the area red or pink?" If so, ask "What size is area of redness?" (inches or cm). "When did the redness start?"     Red and sore 5. PAIN: "Is there any pain?" If so, ask: "How bad is it?"  (Scale 1-10; or mild, moderate, severe)     2 more of an itch 6. ITCHING: "Does it itch?" If so, ask: "How bad is the itch?"    - MILD: doesn't interfere with normal activities   - MODERATE - SEVERE: interferes with  work, school, sleep, or other activities      5 7. SWELLING: "How big is the swelling?" (inches, cm, or compare to coins)     Egg size knot 8. OTHER SYMPTOMS: "Do you have any other symptoms?"  (e.g., difficulty breathing, hives)     no 9. PREGNANCY: "Is there any chance you are pregnant?" "When was your last menstrual period?"     No  Protocols used: INSECT BITE-A-AH, LEG SWELLING AND EDEMA-A-AH

## 2018-07-08 ENCOUNTER — Ambulatory Visit: Payer: Medicare Other | Admitting: Medical

## 2018-07-08 ENCOUNTER — Telehealth: Payer: Self-pay | Admitting: Internal Medicine

## 2018-07-08 ENCOUNTER — Encounter: Payer: Self-pay | Admitting: Medical

## 2018-07-08 VITALS — BP 140/76 | HR 58 | Temp 98.0°F | Ht 59.0 in | Wt 87.0 lb

## 2018-07-08 DIAGNOSIS — L299 Pruritus, unspecified: Secondary | ICD-10-CM

## 2018-07-08 DIAGNOSIS — L089 Local infection of the skin and subcutaneous tissue, unspecified: Secondary | ICD-10-CM | POA: Diagnosis not present

## 2018-07-08 DIAGNOSIS — I10 Essential (primary) hypertension: Secondary | ICD-10-CM

## 2018-07-08 DIAGNOSIS — E7800 Pure hypercholesterolemia, unspecified: Secondary | ICD-10-CM

## 2018-07-08 DIAGNOSIS — I517 Cardiomegaly: Secondary | ICD-10-CM

## 2018-07-08 MED ORDER — PREDNISONE 10 MG PO TABS: 10.0000 mg | ORAL_TABLET | Freq: Every day | ORAL | 0 refills | Status: DC

## 2018-07-08 MED ORDER — DOXYCYCLINE HYCLATE 100 MG PO TABS: 100.0000 mg | ORAL_TABLET | Freq: Two times a day (BID) | ORAL | 0 refills | Status: DC

## 2018-07-08 NOTE — Patient Instructions (Signed)
You do appear to have early skin infection/cellulitis following a possible insect bite(patient has concern for spider.)  Will prescribe doxycycline antibiotic.  Rx advisement given.  Also since itching recent predominant feature gave low-dose prednisone to use 1 tablet daily x4 days.  If itching despite the prednisone then can use low-dose Benadryl.  Watch area for any worsening signs and symptoms.  If notify us/be seen.  Follow-up in 7 days or as needed.

## 2018-07-08 NOTE — Progress Notes (Signed)
Subjective:    Patient ID: Amber Griffith, female    DOB: 04-11-1951, 67 y.o.   MRN: [id number]  HPI  Pt in with rt lower calf area redness an mild tender over past 2 days. She at first felt mild itch. She does not report sting or bite that she remembers. She does speculate that maybe was spider bite.  She states felt little bit possible fever. Noticed area started to get warm and tender over past 24 hours.  Pt thinks she felt little bit dry mouth and faint transient short of breath. But no lip swelling, no syncope and no wheezing. No respiratory symptoms now.     Review of Systems  Constitutional: Positive for fever. Negative for chills and fatigue.  HENT: Negative for congestion, ear discharge and ear pain.   Respiratory: Negative for cough, chest tightness, shortness of breath and wheezing.        See hpi.  Cardiovascular: Negative for chest pain and palpitations.  Gastrointestinal: Negative for abdominal pain.  Musculoskeletal: Negative for back pain.  Skin:       See hpi and exam  Hematological: Negative for adenopathy. Does not bruise/bleed easily.  Psychiatric/Behavioral: Negative for behavioral problems and confusion.    Past Medical History:  Diagnosis Date  . ALLERGIC RHINITIS 09/01/2007  . Anxiety and depression 12/24/2010  . Cardiomegaly 01/17/2009  . CONSTIPATION, CHRONIC 12/18/2008  . COPD 04/04/2007  . ELECTROCARDIOGRAM, ABNORMAL 01/17/2009  . GERD 04/04/2007  . HEADACHE, TENSION 04/18/2008  . HEMORRHOIDS 09/25/2007  . HYPERTENSION 06/12/2007  . SINUSITIS- ACUTE-NOS 05/08/2010  . TOBACCO ABUSE 04/04/2007  . UTI 09/30/2010  . Vaginitis and vulvovaginitis, unspecified 09/30/2010     Social History   Socioeconomic History  . Marital status: Divorced    Spouse name: Not on file  . Number of children: 1  . Years of education: Not on file  . Highest education level: Not on file  Occupational History  . Occupation: custodial    Employer: [employer]    Social Griffith  . Financial resource strain: Not on file  . Food insecurity:    Worry: Not on file    Inability: Not on file  . Transportation Griffith:    Medical: Not on file    Non-medical: Not on file  Tobacco Use  . Smoking status: Current Every Day Smoker    Packs/day: 1.00    Years: 35.00    Pack years: 35.00    Types: Cigarettes  . Smokeless tobacco: Never Used  Substance and Sexual Activity  . Alcohol use: Yes    Comment: 2 cans per day  . Drug use: No  . Sexual activity: Not Currently  Lifestyle  . Physical activity:    Days per week: Not on file    Minutes per session: Not on file  . Stress: Not on file  Relationships  . Social connections:    Talks on phone: Not on file    Gets together: Not on file    Attends religious service: Not on file    Active member of club or organization: Not on file    Attends meetings of clubs or organizations: Not on file    Relationship status: Not on file  . Intimate partner violence:    Fear of current or ex partner: Not on file    Emotionally abused: Not on file    Physically abused: Not on file    Forced sexual activity: Not on file  Other  Topics Concern  . Not on file  Social History Narrative  . Not on file    Past Surgical History:  Procedure Laterality Date  . AMPUTATION Right 09/23/2017   Procedure: REVISION AMPUTATION RIGHT INDEX FINGER;  Surgeon: Betha LoaKuzma, Kevin, MD;  Location: [facility];  Service: Orthopedics;  Laterality: Right;  . BREAST BIOPSY Left   . s/p breast bx     left  . s/p umbilical hernia    . TONSILLECTOMY    . TUBAL LIGATION      Family History  Problem Relation Age of Onset  . Asthma Mother   . Hypertension Mother   . COPD Maternal Uncle   . Hypertension Father   . Seizures Father   . Stroke Paternal Uncle     No Known Allergies  Current Outpatient Medications on File Prior to Visit  Medication Sig Dispense Refill  . aspirin 81 MG tablet Take 81 mg by mouth daily.       . chlorthalidone (HYGROTON) 25 MG tablet TAKE 1 TABLET BY MOUTH EVERY DAY 90 tablet 0  . doxycycline (VIBRA-TABS) 100 MG tablet Take 1 tablet (100 mg total) by mouth 2 (two) times daily. 20 tablet 0  . esomeprazole (NEXIUM) 40 MG packet Take 40 mg by mouth as needed. For heartburn 30 each 5  . Fluticasone-Umeclidin-Vilant (TRELEGY ELLIPTA) 100-62.5-25 MCG/INH AEPB Inhale 1 puff into the lungs daily. 90 each 1  . predniSONE (DELTASONE) 20 MG tablet Take 1 tablet (20 mg total) by mouth daily with breakfast. 5 tablet 0  . promethazine-dextromethorphan (PROMETHAZINE-DM) 6.25-15 MG/5ML syrup Take 5 mLs by mouth 4 (four) times daily as needed for cough. 118 mL 0  . rosuvastatin (CRESTOR) 5 MG tablet Take 1 tablet (5 mg total) by mouth daily. 90 tablet 1  . Vitamin D, Ergocalciferol, (DRISDOL) 50000 units CAPS capsule TAKE ONE CAPSULE BY MOUTH EVERY 7 DAYS 12 capsule 1   No current facility-administered medications on file prior to visit.     BP 140/76 (BP Location: Left Arm, Patient Position: Sitting, Cuff Size: Normal)   Pulse (!) 58   Temp 98 F (36.7 C) (Oral)   Ht 4\' 11"  (1.499 m)   Wt 87 lb (39.5 kg)   SpO2 98%   BMI 17.57 kg/m       Objective:   Physical Exam  General- No acute distress. Pleasant patient. Neck- Full range of motion, no jvd Lungs- Clear, even and unlabored. Heart- regular rate and rhythm. Neurologic- CNII- XII grossly intact.  Rt lower extremity- distal calf 2 cm x 3 cm faint red, tender and indurated. No break down to skin. No blisters.       Assessment & Plan:  You do appear to have early skin infection/cellulitis following a possible insect bite(patient has concern for spider.)  Will prescribe doxycycline antibiotic.  Rx advisement given.  Also since itching recent predominant feature gave low-dose prednisone to use 1 tablet daily x4 days.  If itching despite the prednisone then can use low-dose Benadryl.  Watch area for any worsening signs and  symptoms.  If notify us/be seen.  Follow-up in 7 days or as needed.  Esperanza RichtersEdward Travontae Freiberger, PA-C

## 2018-07-10 ENCOUNTER — Ambulatory Visit: Payer: Self-pay | Admitting: *Deleted

## 2018-07-10 NOTE — Telephone Encounter (Signed)
"  Pt saw edward on 07-08-18. Pt is taking doxycycline and it is making her itch. Pt would like to know if she can take benadryl also"  Returned call to patient regarding above message of  taking doxycycline and benadryl. No answer. Left message for patient to call the office in the morning to discuss her medications.

## 2018-07-10 NOTE — Telephone Encounter (Signed)
Pt states she has mild itching after starting medications 07/08/18. Saw E. Saguier with cellulitis following insect bite. Placed on doxycline and prednisone.  Denies rash, states itching is primarily at bite site. Per note ok for pt to take low dose Benadryl. Reviewed with pt, verbalized understanding. Advised to speak with pharmacist if any additional questions. Instructed to CB for any further symptoms, itching increases in intensity, rash occurs, SOB.  Reason for Disposition . Caller has NON-URGENT medication question about med that PCP prescribed and triager unable to answer question  Answer Assessment - Initial Assessment Questions 1. SYMPTOMS: "Do you have any symptoms?"     Itching, mild 2. SEVERITY: If symptoms are present, ask "Are they mild, moderate or severe?"     mild  Protocols used: MEDICATION QUESTION CALL-A-AH

## 2018-07-11 NOTE — Telephone Encounter (Signed)
Is pt able to take benadryl with other medications? Please advise at your convenience.

## 2018-07-11 NOTE — Telephone Encounter (Signed)
Pt had a rash but it is clearing up, pt stated that it is not as red as it was. Pt stated that the itching is starting to slow down as well.

## 2018-07-11 NOTE — Telephone Encounter (Signed)
Dose she have a rash?

## 2018-07-11 NOTE — Telephone Encounter (Signed)
If she thinks she is having allergic reaction to doxycycline then she should stop taking it. Yes, she can add Benadryl to her current medications.

## 2018-07-24 MED ORDER — CHLORTHALIDONE 25 MG PO TABS: 25.0000 mg | ORAL_TABLET | Freq: Every day | ORAL | 0 refills | Status: DC

## 2018-07-24 MED ORDER — ROSUVASTATIN CALCIUM 5 MG PO TABS: 5.0000 mg | ORAL_TABLET | Freq: Every day | ORAL | 0 refills | Status: DC

## 2018-07-24 NOTE — Addendum Note (Signed)
Addended by: Verlan FriendsAIRRIKIER DAVIDSON, Ansen Sayegh M on: 07/24/2018 02:21 PM   Modules accepted: Orders

## 2018-07-24 NOTE — Telephone Encounter (Signed)
Pt called to make an appointment for 08/17/18 and would like to know if she could have enough medication until OV. Please advise

## 2018-07-24 NOTE — Telephone Encounter (Signed)
erx has been sent as requested.  

## 2018-08-17 ENCOUNTER — Encounter: Payer: Self-pay | Admitting: Internal Medicine

## 2018-08-17 ENCOUNTER — Ambulatory Visit: Payer: Medicare Other | Admitting: Internal Medicine

## 2018-08-17 ENCOUNTER — Other Ambulatory Visit (INDEPENDENT_AMBULATORY_CARE_PROVIDER_SITE_OTHER): Payer: Medicare Other

## 2018-08-17 VITALS — BP 142/66 | HR 97 | Temp 97.6°F | Resp 16 | Ht 59.0 in | Wt 90.0 lb

## 2018-08-17 DIAGNOSIS — I10 Essential (primary) hypertension: Secondary | ICD-10-CM | POA: Diagnosis not present

## 2018-08-17 DIAGNOSIS — Z1159 Encounter for screening for other viral diseases: Secondary | ICD-10-CM | POA: Insufficient documentation

## 2018-08-17 DIAGNOSIS — E559 Vitamin D deficiency, unspecified: Secondary | ICD-10-CM

## 2018-08-17 DIAGNOSIS — E876 Hypokalemia: Secondary | ICD-10-CM | POA: Diagnosis not present

## 2018-08-17 DIAGNOSIS — Z91038 Other insect allergy status: Secondary | ICD-10-CM | POA: Diagnosis not present

## 2018-08-17 DIAGNOSIS — T502X5A Adverse effect of carbonic-anhydrase inhibitors, benzothiadiazides and other diuretics, initial encounter: Secondary | ICD-10-CM

## 2018-08-17 LAB — BASIC METABOLIC PANEL
BUN: 14 mg/dL (ref 6–23)
CALCIUM: 9.2 mg/dL (ref 8.4–10.5)
CO2: 33 meq/L — AB (ref 19–32)
Chloride: 103 mEq/L (ref 96–112)
Creatinine, Ser: 0.63 mg/dL (ref 0.40–1.20)
GFR: 114.39 mL/min (ref >60.00)
GLUCOSE: 96 mg/dL (ref 70–99)
Potassium: 3.1 mEq/L — ABNORMAL LOW (ref 3.5–5.1)
SODIUM: 141 meq/L (ref 135–145)

## 2018-08-17 MED ORDER — CLOBETASOL PROPIONATE 0.05 % EX OINT: 1.0000 "application " | TOPICAL_OINTMENT | Freq: Two times a day (BID) | CUTANEOUS | 0 refills | Status: DC

## 2018-08-17 NOTE — Progress Notes (Signed)
Subjective:  Patient ID: Amber Griffith, female    DOB: 04/14/1951  Age: 68 y.o. MRN: [id number]  CC: Hypertension and Rash   HPI Amber NeedsRosalyn L Saha presents for f/up - She complains of a 5-day history of itchy rash on the medial aspect of her left lower leg.  She thinks she sustained some bug bites.  She has been treating it with hydrogen peroxide which has not helped.  She tells me her blood pressure has been well controlled.  Outpatient Medications Prior to Visit  Medication Sig Dispense Refill  . aspirin 81 MG tablet Take 81 mg by mouth daily.      Marland Kitchen. esomeprazole (NEXIUM) 40 MG packet Take 40 mg by mouth as needed. For heartburn 30 each 5  . Fluticasone-Umeclidin-Vilant (TRELEGY ELLIPTA) 100-62.5-25 MCG/INH AEPB Inhale 1 puff into the lungs daily. 90 each 1  . rosuvastatin (CRESTOR) 5 MG tablet Take 1 tablet (5 mg total) by mouth daily. 90 tablet 0  . Vitamin D, Ergocalciferol, (DRISDOL) 50000 units CAPS capsule TAKE ONE CAPSULE BY MOUTH EVERY 7 DAYS 12 capsule 1  . chlorthalidone (HYGROTON) 25 MG tablet Take 1 tablet (25 mg total) by mouth daily. 90 tablet 0  . doxycycline (VIBRA-TABS) 100 MG tablet Take 1 tablet (100 mg total) by mouth 2 (two) times daily. Can give caps or generic 20 tablet 0  . predniSONE (DELTASONE) 10 MG tablet Take 1 tablet (10 mg total) by mouth daily with breakfast. 4 tablet 0  . promethazine-dextromethorphan (PROMETHAZINE-DM) 6.25-15 MG/5ML syrup Take 5 mLs by mouth 4 (four) times daily as needed for cough. 118 mL 0   No facility-administered medications prior to visit.     ROS Review of Systems  Constitutional: Negative for diaphoresis, fatigue and unexpected weight change.  HENT: Negative.   Respiratory: Negative for cough, chest tightness, shortness of breath and wheezing.   Cardiovascular: Negative for chest pain, palpitations and leg swelling.  Gastrointestinal: Negative for abdominal pain, constipation, diarrhea, nausea and vomiting.    Genitourinary: Negative.  Negative for difficulty urinating and dysuria.  Musculoskeletal: Negative.  Negative for arthralgias, back pain and neck pain.  Skin: Positive for color change and rash.  Neurological: Negative.  Negative for dizziness, weakness and light-headedness.  Hematological: Negative for adenopathy. Does not bruise/bleed easily.  Psychiatric/Behavioral: Negative.     Objective:  BP (!) 142/66 (BP Location: Left Arm, Patient Position: Sitting, Cuff Size: Normal)   Pulse 97   Temp 97.6 F (36.4 C) (Oral)   Resp 16   Ht 4\' 11"  (1.499 m)   Wt 90 lb (40.8 kg)   SpO2 95%   BMI 18.18 kg/m   BP Readings from Last 3 Encounters:  08/17/18 (!) 142/66  07/08/18 140/76  05/08/18 140/70    Wt Readings from Last 3 Encounters:  08/17/18 90 lb (40.8 kg)  07/08/18 87 lb (39.5 kg)  05/08/18 88 lb (39.9 kg)    Physical Exam Vitals signs reviewed.  HENT:     Nose: Nose normal. No congestion or rhinorrhea.     Mouth/Throat:     Mouth: Mucous membranes are moist.     Pharynx: No oropharyngeal exudate or posterior oropharyngeal erythema.  Eyes:     General: No scleral icterus.    Conjunctiva/sclera: Conjunctivae normal.  Neck:     Musculoskeletal: Normal range of motion and neck supple. No muscular tenderness.  Cardiovascular:     Rate and Rhythm: Normal rate and regular rhythm.     Heart sounds:  No murmur. No gallop.   Pulmonary:     Breath sounds: No stridor. No wheezing, rhonchi or rales.  Abdominal:     General: Bowel sounds are normal.     Palpations: There is no hepatomegaly, splenomegaly or mass.     Tenderness: There is no abdominal tenderness. There is no guarding.     Hernia: No hernia is present.  Musculoskeletal: Normal range of motion.        General: No swelling.     Right lower leg: No edema.     Left lower leg: No edema.       Legs:  Skin:    General: Skin is warm and dry.     Findings: Rash present.  Neurological:     General: No focal  deficit present.     Mental Status: She is oriented to person, place, and time. Mental status is at baseline.     Lab Results  Component Value Date   WBC 7.9 01/25/2018   HGB 12.9 01/25/2018   HCT 38.8 01/25/2018   PLT 254.0 01/25/2018   GLUCOSE 96 08/17/2018   CHOL 179 01/25/2018   TRIG 223.0 (H) 01/25/2018   HDL 57.50 01/25/2018   LDLDIRECT 84.0 01/25/2018   LDLCALC 83 08/31/2016   ALT 15 08/31/2016   AST 15 08/31/2016   NA 141 08/17/2018   K 3.1 (L) 08/17/2018   CL 103 08/17/2018   CREATININE 0.63 08/17/2018   BUN 14 08/17/2018   CO2 33 (H) 08/17/2018   TSH 0.80 01/25/2018   HGBA1C 4.9 07/30/2013    Dg Chest 2 View  Result Date: 05/12/2018 CLINICAL DATA:  Persistent cough for 7 days. History of hypertension, smoker, COPD, pneumonia. EXAM: CHEST - 2 VIEW COMPARISON:  Chest x-ray dated 06/09/2017. FINDINGS: Heart size and mediastinal contours are stable. Lungs are hyperexpanded indicating COPD. Coarse lung markings are again noted, indicating associated chronic bronchitic change and/or chronic interstitial lung disease. No confluent opacity to suggest superimposed pneumonia. No pleural effusion or pneumothorax seen. No suspicious nodule or mass seen. Osseous structures about the chest are unremarkable. IMPRESSION: 1. No active cardiopulmonary disease. No evidence of pneumonia or pulmonary edema. 2. COPD. 3. Chronic bronchitic change and/or chronic interstitial lung disease. Electronically Signed   By: Bary Richard M.D.   On: 05/12/2018 16:47    Assessment & Plan:   Leocadia was seen today for hypertension and rash.  Diagnoses and all orders for this visit:  Essential hypertension- Her blood pressure is not adequately well controlled and she has developed hypokalemia.  I have asked her to change from a thiazide diuretic to a potassium sparing diuretic. -     Basic metabolic panel; Future -     spironolactone (ALDACTONE) 25 MG tablet; Take 1 tablet (25 mg total) by mouth  daily.  Need for hepatitis C screening test -     Hepatitis C antibody; Future  Allergic reaction to insect bite- Will treat this with a topical steroid. -     clobetasol ointment (TEMOVATE) 0.05 %; Apply 1 application topically 2 (two) times daily.  Vitamin D deficiency- Her vitamin D level is in the normal range now. -     VITAMIN D 25 Hydroxy (Vit-D Deficiency, Fractures); Future  Diuretic-induced hypokalemia- See above -     spironolactone (ALDACTONE) 25 MG tablet; Take 1 tablet (25 mg total) by mouth daily.   I have discontinued Pluma L. Forero's promethazine-dextromethorphan, doxycycline, predniSONE, and chlorthalidone. I am also having her start  on clobetasol ointment and spironolactone. Additionally, I am having her maintain her aspirin, Fluticasone-Umeclidin-Vilant, Vitamin D (Ergocalciferol), esomeprazole, and rosuvastatin.  Meds ordered this encounter  Medications  . clobetasol ointment (TEMOVATE) 0.05 %    Sig: Apply 1 application topically 2 (two) times daily.    Dispense:  30 g    Refill:  0  . spironolactone (ALDACTONE) 25 MG tablet    Sig: Take 1 tablet (25 mg total) by mouth daily.    Dispense:  90 tablet    Refill:  0     Follow-up: Return in about 3 weeks (around 09/07/2018).  Sanda Lingerhomas Particia Strahm, MD

## 2018-08-17 NOTE — Patient Instructions (Signed)
Rash, Adult  A rash is a change in the color of your skin. A rash can also change the way your skin feels. There are many different conditions and factors that can cause a rash. Some rashes may disappear after a few days, but some may last for a few weeks. Common causes of rashes include:   Viral infections, such as:  ? Colds.  ? Measles.  ? Hand, foot, and mouth disease.   Bacterial infections, such as:  ? Scarlet fever.  ? Impetigo.   Fungal infections, such as Candida.   Allergic reactions to food, medicines, or skin care products.  Follow these instructions at home:  The goal of treatment is to stop the itching and keep the rash from spreading. Pay attention to any changes in your symptoms. Follow these instructions to help with your condition:  Medicine  Take or apply over-the-counter and prescription medicines only as told by your health care provider. These may include:   Corticosteroid creams to treat red or swollen skin.   Anti-itch lotions.   Oral allergy medicines (antihistamines).   Oral corticosteroids for severe symptoms.    Skin care   Apply cool compresses to the affected areas.   Do not scratch or rub your skin.   Avoid covering the rash. Make sure the rash is exposed to air as much as possible.  Managing itching and discomfort   Avoid hot showers or baths, which can make itching worse. A cold shower may help.   Try taking a bath with:  ? Epsom salts. Follow manufacturer instructions on the packaging. You can get these at your local pharmacy or grocery store.  ? Baking soda. Pour a small amount into the bath as told by your health care provider.  ? Colloidal oatmeal. Follow manufacturer instructions on the packaging. You can get this at your local pharmacy or grocery store.   Try applying baking soda paste to your skin. Stir water into baking soda until it reaches a paste-like consistency.   Try applying calamine lotion. This is an over-the-counter lotion that helps to relieve  itchiness.   Keep cool and out of the sun. Sweating and being hot can make itching worse.  General instructions     Rest as needed.   Drink enough fluid to keep your urine pale yellow.   Wear loose-fitting clothing.   Avoid scented soaps, detergents, and perfumes. Use gentle soaps, detergents, perfumes, and other cosmetic products.   Avoid any substance that causes your rash. Keep a journal to help track what causes your rash. Write down:  ? What you eat.  ? What cosmetic products you use.  ? What you drink.  ? What you wear. This includes jewelry.   Keep all follow-up visits as told by your health care provider. This is important.  Contact a health care provider if:   You sweat at night.   You lose weight.   You urinate more than normal.   You urinate less than normal, or you notice that your urine is a darker color than usual.   You feel weak.   You vomit.   Your skin or the whites of your eyes look yellow (jaundice).   Your skin:  ? Tingles.  ? Is numb.   Your rash:  ? Does not go away after several days.  ? Gets worse.   You are:  ? Unusually thirsty.  ? More tired than normal.   You have:  ? New symptoms.  ?   Pain in your abdomen.  ? A fever.  ? Diarrhea.  Get help right away if you:   Have a fever and your symptoms suddenly get worse.   Develop confusion.   Have a severe headache or a stiff neck.   Have severe joint pains or stiffness.   Have a seizure.   Develop a rash that covers all or most of your body. The rash may or may not be painful.   Develop blisters that:  ? Are on top of the rash.  ? Grow larger or grow together.  ? Are painful.  ? Are inside your nose or mouth.   Develop a rash that:  ? Looks like purple pinprick-sized spots all over your body.  ? Has a "bull's eye" or looks like a target.  ? Is not related to sun exposure, is red and painful, and causes your skin to peel.  Summary   A rash is a change in the color of your skin. Some rashes disappear after a few days,  but some may last for a few weeks.   The goal of treatment is to stop the itching and keep the rash from spreading.   Take or apply over-the-counter and prescription medicines only as told by your health care provider.   Contact a health care provider if you have new or worsening symptoms.   Keep all follow-up visits as told by your health care provider. This is important.  This information is not intended to replace advice given to you by your health care provider. Make sure you discuss any questions you have with your health care provider.  Document Released: 07/09/2002 Document Revised: 02/20/2018 Document Reviewed: 02/20/2018  Elsevier Interactive Patient Education  2019 [employer]

## 2018-08-18 LAB — VITAMIN D 25 HYDROXY (VIT D DEFICIENCY, FRACTURES): VITD: 60.47 ng/mL (ref 30.00–100.00)

## 2018-08-18 LAB — HEPATITIS C ANTIBODY
Hepatitis C Ab: NONREACTIVE
SIGNAL TO CUT-OFF: 0.03 (ref <1.00)

## 2018-08-19 MED ORDER — SPIRONOLACTONE 25 MG PO TABS: 25.0000 mg | ORAL_TABLET | Freq: Every day | ORAL | 0 refills | Status: DC

## 2018-09-25 ENCOUNTER — Ambulatory Visit (INDEPENDENT_AMBULATORY_CARE_PROVIDER_SITE_OTHER): Payer: Medicare Other | Admitting: Internal Medicine

## 2018-09-25 ENCOUNTER — Other Ambulatory Visit (INDEPENDENT_AMBULATORY_CARE_PROVIDER_SITE_OTHER): Payer: Medicare Other

## 2018-09-25 ENCOUNTER — Encounter: Payer: Self-pay | Admitting: Internal Medicine

## 2018-09-25 VITALS — BP 140/60 | HR 76 | Temp 97.6°F | Resp 16 | Ht 59.0 in | Wt 90.8 lb

## 2018-09-25 DIAGNOSIS — R1310 Dysphagia, unspecified: Secondary | ICD-10-CM | POA: Insufficient documentation

## 2018-09-25 DIAGNOSIS — I10 Essential (primary) hypertension: Secondary | ICD-10-CM

## 2018-09-25 DIAGNOSIS — K2100 Gastro-esophageal reflux disease with esophagitis, without bleeding: Secondary | ICD-10-CM

## 2018-09-25 DIAGNOSIS — R1319 Other dysphagia: Secondary | ICD-10-CM | POA: Insufficient documentation

## 2018-09-25 LAB — CBC WITH DIFFERENTIAL/PLATELET
Basophils Absolute: 0.1 10*3/uL (ref 0.0–0.1)
Basophils Relative: 1.3 % (ref 0.0–3.0)
EOS PCT: 1.2 % (ref 0.0–5.0)
Eosinophils Absolute: 0.1 10*3/uL (ref 0.0–0.7)
HCT: 38.9 % (ref 36.0–46.0)
Hemoglobin: 12.8 g/dL (ref 12.0–15.0)
LYMPHS ABS: 2.4 10*3/uL (ref 0.7–4.0)
Lymphocytes Relative: 35.4 % (ref 12.0–46.0)
MCHC: 33 g/dL (ref 30.0–36.0)
MCV: 92.5 fl (ref 78.0–100.0)
MONO ABS: 0.8 10*3/uL (ref 0.1–1.0)
MONOS PCT: 11.7 % (ref 3.0–12.0)
NEUTROS PCT: 50.4 % (ref 43.0–77.0)
Neutro Abs: 3.4 10*3/uL (ref 1.4–7.7)
Platelets: 237 10*3/uL (ref 150.0–400.0)
RBC: 4.21 Mil/uL (ref 3.87–5.11)
RDW: 13.6 % (ref 11.5–15.5)
WBC: 6.8 10*3/uL (ref 4.0–10.5)

## 2018-09-25 LAB — BASIC METABOLIC PANEL
BUN: 14 mg/dL (ref 6–23)
CO2: 26 meq/L (ref 19–32)
CREATININE: 0.77 mg/dL (ref 0.40–1.20)
Calcium: 8.9 mg/dL (ref 8.4–10.5)
Chloride: 110 mEq/L (ref 96–112)
GFR: 90.71 mL/min (ref >60.00)
GLUCOSE: 95 mg/dL (ref 70–99)
Potassium: 3.9 mEq/L (ref 3.5–5.1)
Sodium: 143 mEq/L (ref 135–145)

## 2018-09-25 MED ORDER — OMEPRAZOLE 40 MG PO CPDR: 40.0000 mg | DELAYED_RELEASE_CAPSULE | Freq: Every day | ORAL | 1 refills | Status: DC

## 2018-09-25 NOTE — Progress Notes (Signed)
Subjective:  Patient ID: Amber Griffith, female    DOB: 02/23/1951  Age: 67 y.o. MRN: [id number]  CC: Gastroesophageal Reflux and Hypertension   HPI Amber Griffith presents for f/up - She complains of a several month history of worsening heartburn and dysphagia with the sensation that water gets stuck in the lower part of her esophagus.  She was prescribed Nexium a few months ago but she did not start taking it because it was too expensive.  She has gotten some symptom relief with over-the-counter doses of Prilosec.  She smokes cigarettes and drinks about 4 beers a day.  Outpatient Medications Prior to Visit  Medication Sig Dispense Refill  . aspirin 81 MG tablet Take 81 mg by mouth daily.      . Fluticasone-Umeclidin-Vilant (TRELEGY ELLIPTA) 100-62.5-25 MCG/INH AEPB Inhale 1 puff into the lungs daily. 90 each 1  . rosuvastatin (CRESTOR) 5 MG tablet Take 1 tablet (5 mg total) by mouth daily. 90 tablet 0  . spironolactone (ALDACTONE) 25 MG tablet Take 1 tablet (25 mg total) by mouth daily. 90 tablet 0  . Vitamin D, Ergocalciferol, (DRISDOL) 50000 units CAPS capsule TAKE ONE CAPSULE BY MOUTH EVERY 7 DAYS 12 capsule 1  . clobetasol ointment (TEMOVATE) 0.05 % Apply 1 application topically 2 (two) times daily. 30 g 0  . esomeprazole (NEXIUM) 40 MG packet Take 40 mg by mouth as needed. For heartburn 30 each 5   No facility-administered medications prior to visit.     ROS Review of Systems  Constitutional: Negative for appetite change, diaphoresis and fatigue.  HENT: Positive for trouble swallowing. Negative for voice change.   Respiratory: Negative.  Negative for cough, chest tightness, shortness of breath and wheezing.   Cardiovascular: Negative for chest pain, palpitations and leg swelling.  Gastrointestinal: Negative for abdominal pain, blood in stool, constipation, diarrhea, nausea and vomiting.  Genitourinary: Negative.  Negative for difficulty urinating and dysuria.    Musculoskeletal: Negative.  Negative for arthralgias and myalgias.  Skin: Negative.  Negative for color change.  Neurological: Negative.  Negative for dizziness, weakness and light-headedness.  Hematological: Negative for adenopathy. Does not bruise/bleed easily.  Psychiatric/Behavioral: Negative.     Objective:  BP 140/60 (BP Location: Left Arm, Patient Position: Sitting, Cuff Size: Normal)   Pulse 76   Temp 97.6 F (36.4 C) (Oral)   Resp 16   Ht 4\' 11"  (1.499 m)   Wt 90 lb 12 oz (41.2 kg)   SpO2 98%   BMI 18.33 kg/m   BP Readings from Last 3 Encounters:  09/25/18 140/60  08/17/18 (!) 142/66  07/08/18 140/76    Wt Readings from Last 3 Encounters:  09/25/18 90 lb 12 oz (41.2 kg)  08/17/18 90 lb (40.8 kg)  07/08/18 87 lb (39.5 kg)    Physical Exam Constitutional:      Appearance: She is not ill-appearing or diaphoretic.  HENT:     Nose: Nose normal. No congestion or rhinorrhea.     Mouth/Throat:     Mouth: Mucous membranes are moist.     Pharynx: Oropharynx is clear. No oropharyngeal exudate or posterior oropharyngeal erythema.  Eyes:     General: No scleral icterus.    Conjunctiva/sclera: Conjunctivae normal.     Pupils: Pupils are equal, round, and reactive to light.  Neck:     Musculoskeletal: Normal range of motion and neck supple. No neck rigidity or muscular tenderness.     Vascular: No carotid bruit.  Cardiovascular:  Rate and Rhythm: Normal rate and regular rhythm.     Pulses: Normal pulses.     Heart sounds: No murmur. No gallop.   Pulmonary:     Effort: Pulmonary effort is normal. No respiratory distress.     Breath sounds: Normal breath sounds. No stridor. No wheezing, rhonchi or rales.  Abdominal:     General: Abdomen is flat. Bowel sounds are normal. There is no distension.     Palpations: There is no mass.     Tenderness: There is no abdominal tenderness. There is no guarding.  Musculoskeletal: Normal range of motion.        General: No  swelling.     Right lower leg: No edema.     Left lower leg: No edema.  Lymphadenopathy:     Cervical: No cervical adenopathy.  Skin:    General: Skin is warm and dry.  Neurological:     General: No focal deficit present.     Mental Status: She is oriented to person, place, and time. Mental status is at baseline.     Lab Results  Component Value Date   WBC 6.8 09/25/2018   HGB 12.8 09/25/2018   HCT 38.9 09/25/2018   PLT 237.0 09/25/2018   GLUCOSE 95 09/25/2018   CHOL 179 01/25/2018   TRIG 223.0 (H) 01/25/2018   HDL 57.50 01/25/2018   LDLDIRECT 84.0 01/25/2018   LDLCALC 83 08/31/2016   ALT 15 08/31/2016   AST 15 08/31/2016   NA 143 09/25/2018   K 3.9 09/25/2018   CL 110 09/25/2018   CREATININE 0.77 09/25/2018   BUN 14 09/25/2018   CO2 26 09/25/2018   TSH 0.80 01/25/2018   HGBA1C 4.9 07/30/2013    Dg Chest 2 View  Result Date: 05/12/2018 CLINICAL DATA:  Persistent cough for 7 days. History of hypertension, smoker, COPD, pneumonia. EXAM: CHEST - 2 VIEW COMPARISON:  Chest x-ray dated 06/09/2017. FINDINGS: Heart size and mediastinal contours are stable. Lungs are hyperexpanded indicating COPD. Coarse lung markings are again noted, indicating associated chronic bronchitic change and/or chronic interstitial lung disease. No confluent opacity to suggest superimposed pneumonia. No pleural effusion or pneumothorax seen. No suspicious nodule or mass seen. Osseous structures about the chest are unremarkable. IMPRESSION: 1. No active cardiopulmonary disease. No evidence of pneumonia or pulmonary edema. 2. COPD. 3. Chronic bronchitic change and/or chronic interstitial lung disease. Electronically Signed   By: Bary Richard M.D.   On: 05/12/2018 16:47    Assessment & Plan:   Amber Griffith was seen today for gastroesophageal reflux and hypertension.  Diagnoses and all orders for this visit:  Essential hypertension- Her blood pressure is adequately well controlled.  Electrolytes and renal  function are normal. -     Basic metabolic panel; Future -     CBC with Differential/Platelet; Future  Gastroesophageal reflux disease with esophagitis- I have asked her to upgrade to a prescription strength PPI. -     CBC with Differential/Platelet; Future -     omeprazole (PRILOSEC) 40 MG capsule; Take 1 capsule (40 mg total) by mouth daily.  Esophageal dysphagia- She has risk factors for upper GI pathology such as ulcer disease and malignancy.  I have asked her to see GI to consider upper endoscopy. -     Ambulatory referral to Gastroenterology   I have discontinued Eliora L. Tuckett's esomeprazole and clobetasol ointment. I am also having her start on omeprazole. Additionally, I am having her maintain her aspirin, Fluticasone-Umeclidin-Vilant, Vitamin D (Ergocalciferol), rosuvastatin,  and spironolactone.  Meds ordered this encounter  Medications  . omeprazole (PRILOSEC) 40 MG capsule    Sig: Take 1 capsule (40 mg total) by mouth daily.    Dispense:  90 capsule    Refill:  1     Follow-up: Return in about 4 months (around 01/24/2019).  Sanda Linger, MD

## 2018-09-25 NOTE — Patient Instructions (Signed)
Gastroesophageal Reflux Disease, Adult Gastroesophageal reflux (GER) happens when acid from the stomach flows up into the tube that connects the mouth and the stomach (esophagus). Normally, food travels down the esophagus and stays in the stomach to be digested. However, when a person has GER, food and stomach acid sometimes move back up into the esophagus. If this becomes a more serious problem, the person may be diagnosed with a disease called gastroesophageal reflux disease (GERD). GERD occurs when the reflux:  Happens often.  Causes frequent or severe symptoms.  Causes problems such as damage to the esophagus. When stomach acid comes in contact with the esophagus, the acid may cause soreness (inflammation) in the esophagus. Over time, GERD may create small holes (ulcers) in the lining of the esophagus. What are the causes? This condition is caused by a problem with the muscle between the esophagus and the stomach (lower esophageal sphincter, or LES). Normally, the LES muscle closes after food passes through the esophagus to the stomach. When the LES is weakened or abnormal, it does not close properly, and that allows food and stomach acid to go back up into the esophagus. The LES can be weakened by certain dietary substances, medicines, and medical conditions, including:  Tobacco use.  Pregnancy.  Having a hiatal hernia.  Alcohol use.  Certain foods and beverages, such as coffee, chocolate, onions, and peppermint. What increases the risk? You are more likely to develop this condition if you:  Have an increased body weight.  Have a connective tissue disorder.  Use NSAID medicines. What are the signs or symptoms? Symptoms of this condition include:  Heartburn.  Difficult or painful swallowing.  The feeling of having a lump in the throat.  Abitter taste in the mouth.  Bad breath.  Having a large amount of saliva.  Having an upset or bloated  stomach.  Belching.  Chest pain. Different conditions can cause chest pain. Make sure you see your health care provider if you experience chest pain.  Shortness of breath or wheezing.  Ongoing (chronic) cough or a night-time cough.  Wearing away of tooth enamel.  Weight loss. How is this diagnosed? Your health care provider will take a medical history and perform a physical exam. To determine if you have mild or severe GERD, your health care provider may also monitor how you respond to treatment. You may also have tests, including:  A test to examine your stomach and esophagus with a small camera (endoscopy).  A test thatmeasures the acidity level in your esophagus.  A test thatmeasures how much pressure is on your esophagus.  A barium swallow or modified barium swallow test to show the shape, size, and functioning of your esophagus. How is this treated? The goal of treatment is to help relieve your symptoms and to prevent complications. Treatment for this condition may vary depending on how severe your symptoms are. Your health care provider may recommend:  Changes to your diet.  Medicine.  Surgery. Follow these instructions at home: Eating and drinking   Follow a diet as recommended by your health care provider. This may involve avoiding foods and drinks such as: ? Coffee and tea (with or without caffeine). ? Drinks that containalcohol. ? Energy drinks and sports drinks. ? Carbonated drinks or sodas. ? Chocolate and cocoa. ? Peppermint and mint flavorings. ? Garlic and onions. ? Horseradish. ? Spicy and acidic foods, including peppers, chili powder, curry powder, vinegar, hot sauces, and barbecue sauce. ? Citrus fruit juices and citrus   fruits, such as oranges, lemons, and limes. ? Tomato-based foods, such as red sauce, chili, salsa, and pizza with red sauce. ? Fried and fatty foods, such as donuts, french fries, potato chips, and high-fat dressings. ? High-fat  meats, such as hot dogs and fatty cuts of red and white meats, such as rib eye steak, sausage, ham, and bacon. ? High-fat dairy items, such as whole milk, butter, and cream cheese.  Eat small, frequent meals instead of large meals.  Avoid drinking large amounts of liquid with your meals.  Avoid eating meals during the 2-3 hours before bedtime.  Avoid lying down right after you eat.  Do not exercise right after you eat. Lifestyle   Do not use any products that contain nicotine or tobacco, such as cigarettes, e-cigarettes, and chewing tobacco. If you need help quitting, ask your health care provider.  Try to reduce your stress by using methods such as yoga or meditation. If you need help reducing stress, ask your health care provider.  If you are overweight, reduce your weight to an amount that is healthy for you. Ask your health care provider for guidance about a safe weight loss goal. General instructions  Pay attention to any changes in your symptoms.  Take over-the-counter and prescription medicines only as told by your health care provider. Do not take aspirin, ibuprofen, or other NSAIDs unless your health care provider told you to do so.  Wear loose-fitting clothing. Do not wear anything tight around your waist that causes pressure on your abdomen.  Raise (elevate) the head of your bed about 6 inches (15 cm).  Avoid bending over if this makes your symptoms worse.  Keep all follow-up visits as told by your health care provider. This is important. Contact a health care provider if:  You have: ? New symptoms. ? Unexplained weight loss. ? Difficulty swallowing or it hurts to swallow. ? Wheezing or a persistent cough. ? A hoarse voice.  Your symptoms do not improve with treatment. Get help right away if you:  Have pain in your arms, neck, jaw, teeth, or back.  Feel sweaty, dizzy, or light-headed.  Have chest pain or shortness of breath.  Vomit and your vomit looks  like blood or coffee grounds.  Faint.  Have stool that is bloody or black.  Cannot swallow, drink, or eat. Summary  Gastroesophageal reflux happens when acid from the stomach flows up into the esophagus. GERD is a disease in which the reflux happens often, causes frequent or severe symptoms, or causes problems such as damage to the esophagus.  Treatment for this condition may vary depending on how severe your symptoms are. Your health care provider may recommend diet and lifestyle changes, medicine, or surgery.  Contact a health care provider if you have new or worsening symptoms.  Take over-the-counter and prescription medicines only as told by your health care provider. Do not take aspirin, ibuprofen, or other NSAIDs unless your health care provider told you to do so.  Keep all follow-up visits as told by your health care provider. This is important. This information is not intended to replace advice given to you by your health care provider. Make sure you discuss any questions you have with your health care provider. Document Released: 04/28/2005 Document Revised: 01/25/2018 Document Reviewed: 01/25/2018 Elsevier Interactive Patient Education  2019 [employer]  

## 2018-10-10 ENCOUNTER — Other Ambulatory Visit: Payer: Self-pay | Admitting: Internal Medicine

## 2018-10-10 DIAGNOSIS — E876 Hypokalemia: Secondary | ICD-10-CM

## 2018-10-10 DIAGNOSIS — I10 Essential (primary) hypertension: Secondary | ICD-10-CM

## 2018-10-10 DIAGNOSIS — T502X5A Adverse effect of carbonic-anhydrase inhibitors, benzothiadiazides and other diuretics, initial encounter: Secondary | ICD-10-CM

## 2018-10-19 ENCOUNTER — Other Ambulatory Visit: Payer: Self-pay | Admitting: Internal Medicine

## 2018-10-19 DIAGNOSIS — M8000XA Age-related osteoporosis with current pathological fracture, unspecified site, initial encounter for fracture: Secondary | ICD-10-CM

## 2018-11-17 ENCOUNTER — Ambulatory Visit: Payer: Self-pay | Admitting: Gastroenterology

## 2018-12-14 ENCOUNTER — Other Ambulatory Visit: Payer: Self-pay

## 2018-12-14 ENCOUNTER — Ambulatory Visit (INDEPENDENT_AMBULATORY_CARE_PROVIDER_SITE_OTHER): Payer: Medicare Other

## 2018-12-14 DIAGNOSIS — M810 Age-related osteoporosis without current pathological fracture: Secondary | ICD-10-CM | POA: Diagnosis not present

## 2018-12-14 MED ORDER — DENOSUMAB 60 MG/ML ~~LOC~~ SOSY
60.0000 mg | PREFILLED_SYRINGE | Freq: Once | SUBCUTANEOUS | Status: AC
  Administered 2018-12-14: 60 mg via SUBCUTANEOUS

## 2018-12-14 NOTE — Progress Notes (Signed)
prolia injection given.   Amber Griffith [name], MD  

## 2018-12-15 ENCOUNTER — Other Ambulatory Visit: Payer: Self-pay | Admitting: Internal Medicine

## 2018-12-15 DIAGNOSIS — Z91038 Other insect allergy status: Secondary | ICD-10-CM

## 2019-01-02 ENCOUNTER — Ambulatory Visit (INDEPENDENT_AMBULATORY_CARE_PROVIDER_SITE_OTHER): Payer: Medicare Other | Admitting: Internal Medicine

## 2019-01-02 ENCOUNTER — Other Ambulatory Visit: Payer: Self-pay

## 2019-01-02 ENCOUNTER — Encounter: Payer: Self-pay | Admitting: Internal Medicine

## 2019-01-02 VITALS — BP 180/80 | HR 71 | Temp 98.3°F | Resp 16 | Ht 59.0 in | Wt 89.0 lb

## 2019-01-02 DIAGNOSIS — I10 Essential (primary) hypertension: Secondary | ICD-10-CM

## 2019-01-02 MED ORDER — TRIAMTERENE-HCTZ 37.5-25 MG PO CAPS: 1.0000 | ORAL_CAPSULE | Freq: Every day | ORAL | 0 refills | Status: DC

## 2019-01-02 NOTE — Progress Notes (Signed)
Subjective:  Patient ID: Amber Griffith, female    DOB: [birth year]  Age: 68 y.o. MRN: [id number]  CC: Headache and Hypertension   HPI Amber Griffith presents for f/up - She has had a headache for 1 week.  She thought the headache was coming from her sinuses so she started taking Tylenol Cold and sinus.  This did not help with her symptoms.  She denies blurred vision, facial pain, runny nose, chest pain, shortness of breath, edema, or fatigue.  She tells me she is compliant with spironolactone.  Outpatient Medications Prior to Visit  Medication Sig Dispense Refill  . aspirin 81 MG tablet Take 81 mg by mouth daily.      . clobetasol ointment (TEMOVATE) 0.05 % APPLY TO AFFECTED AREA TWICE A DAY 30 g 0  . Fluticasone-Umeclidin-Vilant (TRELEGY ELLIPTA) 100-62.5-25 MCG/INH AEPB Inhale 1 puff into the lungs daily. 90 each 1  . omeprazole (PRILOSEC) 40 MG capsule Take 1 capsule (40 mg total) by mouth daily. 90 capsule 1  . rosuvastatin (CRESTOR) 5 MG tablet Take 1 tablet (5 mg total) by mouth daily. 90 tablet 0  . Vitamin D, Ergocalciferol, (DRISDOL) 1.25 MG (50000 UT) CAPS capsule TAKE ONE CAPSULE BY MOUTH EVERY 7 DAYS 12 capsule 0  . spironolactone (ALDACTONE) 25 MG tablet TAKE 1 TABLET BY MOUTH EVERY DAY 30 tablet 2   No facility-administered medications prior to visit.     ROS Review of Systems  Constitutional: Negative.  Negative for chills, fatigue and fever.  HENT: Negative.  Negative for congestion, facial swelling, nosebleeds, postnasal drip, rhinorrhea, sinus pressure, sinus pain, sneezing, sore throat and trouble swallowing.   Respiratory: Negative for cough, chest tightness, shortness of breath and wheezing.   Cardiovascular: Negative for chest pain, palpitations and leg swelling.  Gastrointestinal: Negative for abdominal pain, diarrhea, nausea and vomiting.  Endocrine: Negative.   Genitourinary: Negative.  Negative for difficulty urinating and hematuria.  Musculoskeletal:  Negative.  Negative for back pain, neck pain and neck stiffness.  Skin: Negative.  Negative for color change.  Neurological: Positive for headaches. Negative for dizziness, weakness and light-headedness.  Hematological: Negative for adenopathy. Does not bruise/bleed easily.  Psychiatric/Behavioral: Negative.     Objective:  BP (!) 180/80 (BP Location: Left Arm, Patient Position: Sitting, Cuff Size: Normal)   Pulse 71   Temp 98.3 F (36.8 C) (Oral)   Resp 16   Ht 4\' 11"  (1.499 m)   Wt 89 lb (40.4 kg)   SpO2 97%   BMI 17.98 kg/m   BP Readings from Last 3 Encounters:  01/02/19 (!) 180/80  09/25/18 140/60  08/17/18 (!) 142/66    Wt Readings from Last 3 Encounters:  01/02/19 89 lb (40.4 kg)  09/25/18 90 lb 12 oz (41.2 kg)  08/17/18 90 lb (40.8 kg)    Physical Exam Vitals signs reviewed.  Constitutional:      General: She is not in acute distress.    Appearance: She is not ill-appearing, toxic-appearing or diaphoretic.  Eyes:     General: No scleral icterus.    Extraocular Movements:     Right eye: Normal extraocular motion and no nystagmus.     Left eye: Normal extraocular motion and no nystagmus.     Pupils: Pupils are equal, round, and reactive to light.     Right eye: Pupil is round and reactive.     Left eye: Pupil is round and reactive.  Neck:     Musculoskeletal: Normal range of  motion. No neck rigidity.     Meningeal: Brudzinski's sign absent.  Cardiovascular:     Rate and Rhythm: Normal rate and regular rhythm.     Heart sounds: No murmur. No gallop.   Pulmonary:     Effort: Pulmonary effort is normal.     Breath sounds: No stridor. No wheezing, rhonchi or rales.  Abdominal:     General: Bowel sounds are normal.     Palpations: There is no hepatomegaly, splenomegaly or mass.     Tenderness: There is no abdominal tenderness.  Lymphadenopathy:     Cervical: No cervical adenopathy.  Neurological:     General: No focal deficit present.     Mental Status:  She is alert.     Cranial Nerves: Cranial nerves are intact. No cranial nerve deficit, dysarthria or facial asymmetry.     Sensory: Sensation is intact.     Motor: Motor function is intact. No weakness or tremor.     Coordination: Coordination is intact.     Lab Results  Component Value Date   WBC 6.8 09/25/2018   HGB 12.8 09/25/2018   HCT 38.9 09/25/2018   PLT 237.0 09/25/2018   GLUCOSE 95 09/25/2018   CHOL 179 01/25/2018   TRIG 223.0 (H) 01/25/2018   HDL 57.50 01/25/2018   LDLDIRECT 84.0 01/25/2018   LDLCALC 83 08/31/2016   ALT 15 08/31/2016   AST 15 08/31/2016   NA 143 09/25/2018   K 3.9 09/25/2018   CL 110 09/25/2018   CREATININE 0.77 09/25/2018   BUN 14 09/25/2018   CO2 26 09/25/2018   TSH 0.80 01/25/2018   HGBA1C 4.9 07/30/2013    Dg Chest 2 View  Result Date: 05/12/2018 CLINICAL DATA:  Persistent cough for 7 days. History of hypertension, smoker, COPD, pneumonia. EXAM: CHEST - 2 VIEW COMPARISON:  Chest x-ray dated 06/09/2017. FINDINGS: Heart size and mediastinal contours are stable. Lungs are hyperexpanded indicating COPD. Coarse lung markings are again noted, indicating associated chronic bronchitic change and/or chronic interstitial lung disease. No confluent opacity to suggest superimposed pneumonia. No pleural effusion or pneumothorax seen. No suspicious nodule or mass seen. Osseous structures about the chest are unremarkable. IMPRESSION: 1. No active cardiopulmonary disease. No evidence of pneumonia or pulmonary edema. 2. COPD. 3. Chronic bronchitic change and/or chronic interstitial lung disease. Electronically Signed   By: Bary Richard M.D.   On: 05/12/2018 16:47    Assessment & Plan:   Amber Griffith was seen today for headache and hypertension.  Diagnoses and all orders for this visit:  Essential hypertension- I think her headache is caused by poorly controlled hypertension.  She started taking a decongestant which made things worse.  I have asked her to stop  taking the decongestant.  I have asked her to stop taking spironolactone as it appears to be ineffective.  I have asked her to upgrade to a combination of triamterene and hydrochlorothiazide. -     triamterene-hydrochlorothiazide (DYAZIDE) 37.5-25 MG capsule; Take 1 each (1 capsule total) by mouth daily.   I have discontinued Virgil L. Eggenberger's spironolactone. I am also having her start on triamterene-hydrochlorothiazide. Additionally, I am having her maintain her aspirin, Fluticasone-Umeclidin-Vilant, rosuvastatin, omeprazole, Vitamin D (Ergocalciferol), and clobetasol ointment.  Meds ordered this encounter  Medications  . triamterene-hydrochlorothiazide (DYAZIDE) 37.5-25 MG capsule    Sig: Take 1 each (1 capsule total) by mouth daily.    Dispense:  90 capsule    Refill:  0     Follow-up: Return in about  4 weeks (around 01/30/2019).  Sanda Lingerhomas Franci Oshana, MD

## 2019-01-02 NOTE — Patient Instructions (Signed)

## 2019-01-03 ENCOUNTER — Encounter: Payer: Self-pay | Admitting: Internal Medicine

## 2019-01-13 ENCOUNTER — Other Ambulatory Visit: Payer: Self-pay | Admitting: Internal Medicine

## 2019-01-13 DIAGNOSIS — I10 Essential (primary) hypertension: Secondary | ICD-10-CM

## 2019-01-13 DIAGNOSIS — E876 Hypokalemia: Secondary | ICD-10-CM

## 2019-02-07 ENCOUNTER — Telehealth: Payer: Self-pay | Admitting: Internal Medicine

## 2019-02-07 NOTE — Telephone Encounter (Signed)
Called patient to schedule AWV. Patient did not answer. No voicemail set up. SF

## 2019-03-05 ENCOUNTER — Ambulatory Visit (INDEPENDENT_AMBULATORY_CARE_PROVIDER_SITE_OTHER): Payer: Medicare Other | Admitting: Family

## 2019-03-05 ENCOUNTER — Other Ambulatory Visit: Payer: Self-pay

## 2019-03-05 ENCOUNTER — Encounter: Payer: Self-pay | Admitting: Family

## 2019-03-05 ENCOUNTER — Ambulatory Visit: Payer: Self-pay | Admitting: Internal Medicine

## 2019-03-05 VITALS — BP 134/80 | HR 82 | Temp 98.2°F | Ht 59.0 in | Wt 92.4 lb

## 2019-03-05 DIAGNOSIS — Z91038 Other insect allergy status: Secondary | ICD-10-CM

## 2019-03-05 DIAGNOSIS — L0390 Cellulitis, unspecified: Secondary | ICD-10-CM

## 2019-03-05 MED ORDER — CETIRIZINE HCL 10 MG PO TABS: 10.0000 mg | ORAL_TABLET | Freq: Every day | ORAL | 0 refills | Status: DC

## 2019-03-05 MED ORDER — CEPHALEXIN 500 MG PO CAPS: 500.0000 mg | ORAL_CAPSULE | Freq: Three times a day (TID) | ORAL | 0 refills | Status: DC

## 2019-03-05 MED ORDER — FAMOTIDINE 20 MG PO TABS: 20.0000 mg | ORAL_TABLET | Freq: Two times a day (BID) | ORAL | 0 refills | Status: DC

## 2019-03-05 NOTE — Telephone Encounter (Signed)
  Pt called in c/o having blisters and bumps around her left ankle after being stung by some kind of bee on Saturday.  See triage notes.  I warm transferred the call into Dr. Ronnald Ramp' office to [facility] to  Be scheduled.  I sent these notes to the office  Reason for Disposition . [1] Red or very tender (to touch) area AND [2] started over 24 hours after the sting  Answer Assessment - Initial Assessment Questions 1. TYPE: "What type of sting was it?" (bee, yellow jacket, etc.)      It was a bee.   [place].   It looked brown.   It stung my left leg. 2. ONSET: "When did it occur?"      Saturday 3. LOCATION: "Where is the sting located?"  "How many stings?"     By my ankle.   One time. 4. SWELLING SIZE: "How big is the swelling?" (e.g., inches or cm)     Yesterday my leg was so big.   My foot is still swollen but is better than yesterday.   Yesterday my foot was purple.   Today it's back to it's normal color but it's still swollen.   I kept ice on it.   5. REDNESS: "Is the area red or pink?" If so, ask "What size is area of redness?" (e.g., inches or cm). "When did the redness start?"     It was purple yesterday.    I'm keeping ice on it.   I soaked in Epsom salts and took Benadryl.     6. PAIN: "Is there any pain?" If so, ask: "How bad is it?"  (Scale 1-10; or mild, moderate, severe)     I have blisters around my ankle now and bumps too.   7. ITCHING: "Is there any itching?" If so, ask: "How bad is it?"      I took Benadryl and hydrocortisone  Cream for the itching and swelling. 8. RESPIRATORY DISTRESS: "Describe your breathing."     Saturday I felt short of breath and my heart was racing a little bit but it went away on it's on. 9. PRIOR REACTIONS: "Have you had any severe allergic reactions to stings in the past?" if yes, ask: "What happened?"     No   10. OTHER SYMPTOMS: "Do you have any other symptoms?" (e.g., abdominal pain, face or tongue swelling, new rash  elsewhere, vomiting)       Saturday I also took Ibuprofen.    No facial swelling. 11. PREGNANCY: "Is there any chance you are pregnant?" "When was your last menstrual period?"       N/A due to age  Protocols used: BEE OR YELLOW JACKET STING-A-AH

## 2019-03-05 NOTE — Progress Notes (Signed)
Amber Griffith is a 68 y.o. female with the following history as recorded in [employer]:  Patient Active Problem List   Diagnosis Date Noted  . Esophageal dysphagia 09/25/2018  . Vitamin D deficiency 08/31/2016  . Cervical cancer screening 08/30/2016  . Visit for screening mammogram 08/30/2016  . Chronic midline thoracic back pain 08/30/2016  . Chronic low back pain without sciatica 06/23/2015  . Compression fracture of lumbar spine, non-traumatic ([place]) 06/23/2015  . Osteoporosis 06/23/2015  . Atopic dermatitis 11/10/2012  . Pure hypercholesterolemia 03/01/2012  . Hemorrhoids without complication 97/98/9211  . Anxiety and depression 12/24/2010  . Insomnia 12/24/2010  . Routine general medical examination at a health care facility 12/24/2010  . Cardiomegaly 01/17/2009  . CONSTIPATION, CHRONIC 12/18/2008  . ALLERGIC RHINITIS 09/01/2007  . Essential hypertension 06/12/2007  . TOBACCO ABUSE 04/04/2007  . COPD (chronic obstructive pulmonary disease) with chronic bronchitis ([place]) 04/04/2007  . GERD 04/04/2007    Current Outpatient Medications  Medication Sig Dispense Refill  . aspirin 81 MG tablet Take 81 mg by mouth daily.      . clobetasol ointment (TEMOVATE) 0.05 % APPLY TO AFFECTED AREA TWICE A DAY 30 g 0  . Fluticasone-Umeclidin-Vilant (TRELEGY ELLIPTA) 100-62.5-25 MCG/INH AEPB Inhale 1 puff into the lungs daily. 90 each 1  . omeprazole (PRILOSEC) 40 MG capsule Take 1 capsule (40 mg total) by mouth daily. 90 capsule 1  . rosuvastatin (CRESTOR) 5 MG tablet Take 1 tablet (5 mg total) by mouth daily. 90 tablet 0  . triamterene-hydrochlorothiazide (DYAZIDE) 37.5-25 MG capsule Take 1 each (1 capsule total) by mouth daily. 90 capsule 0  . Vitamin D, Ergocalciferol, (DRISDOL) 1.25 MG (50000 UT) CAPS capsule TAKE ONE CAPSULE BY MOUTH EVERY 7 DAYS 12 capsule 0  . cephALEXin (KEFLEX) 500 MG capsule Take 1 capsule (500 mg total) by mouth 3 (three) times daily. 15 capsule 0  . cetirizine  (ZYRTEC) 10 MG tablet Take 1 tablet (10 mg total) by mouth at bedtime. 30 tablet 0  . famotidine (PEPCID) 20 MG tablet Take 1 tablet (20 mg total) by mouth 2 (two) times daily. 30 tablet 0   No current facility-administered medications for this visit.     Allergies: Patient has no known allergies.  Past Medical History:  Diagnosis Date  . ALLERGIC RHINITIS 09/01/2007  . Anxiety and depression 12/24/2010  . Cardiomegaly 01/17/2009  . CONSTIPATION, CHRONIC 12/18/2008  . COPD 04/04/2007  . ELECTROCARDIOGRAM, ABNORMAL 01/17/2009  . GERD 04/04/2007  . HEADACHE, TENSION 04/18/2008  . HEMORRHOIDS 09/25/2007  . HYPERTENSION 06/12/2007  . SINUSITIS- ACUTE-NOS 05/08/2010  . TOBACCO ABUSE 04/04/2007  . UTI 09/30/2010  . Vaginitis and vulvovaginitis, unspecified 09/30/2010    Past Surgical History:  Procedure Laterality Date  . AMPUTATION Right 09/23/2017   Procedure: REVISION AMPUTATION RIGHT INDEX FINGER;  Surgeon: Leanora Cover, MD;  Location: [place];  Service: Orthopedics;  Laterality: Right;  . BREAST BIOPSY Left   . s/p breast bx     left  . s/p umbilical hernia    . TONSILLECTOMY    . TUBAL LIGATION      Family History  Problem Relation Age of Onset  . Asthma Mother   . Hypertension Mother   . COPD Maternal Uncle   . Hypertension Father   . Seizures Father   . Stroke Paternal Uncle     Social History   Tobacco Use  . Smoking status: Current Every Day Smoker    Packs/day: 1.00  Years: 35.00    Pack years: 35.00    Types: Cigarettes  . Smokeless tobacco: Never Used  Substance Use Topics  . Alcohol use: Yes    Alcohol/week: 28.0 standard drinks    Types: 28 Cans of beer per week    Subjective:  Patient accidentally stepped on a yellow jacket nest on Saturday while she was at her mother's grave. Was stung on left foot/ ankle; concerned about redness/ swelling of left ankle; has been taking Benadryl with some benefit; no shortness of breath, no difficulty breathing;     Objective:  Vitals:   03/05/19 1138  BP: 134/80  Pulse: 82  Temp: 98.2 F (36.8 C)  TempSrc: Oral  SpO2: 98%  Weight: 92 lb 6.4 oz (41.9 kg)  Height: 4\' 11"  (1.499 m)    General: Well developed, well nourished, in no acute distress  Skin : Warm and dry. Erythema, swelling noted over left ankle/ foot; large cluster of blisters noted on inner right ankle;  Head: Normocephalic and atraumatic  Eyes: Sclera and conjunctiva clear; pupils round and reactive to light; extraocular movements intact  Ears: External normal; canals clear; tympanic membranes normal  Oropharynx: Pink, supple. No suspicious lesions  Neck: Supple without thyromegaly, adenopathy  Lungs: Respirations unlabored;  Neurologic: Alert and oriented; speech intact; face symmetrical; moves all extremities well; CNII-XII intact without focal deficit   Assessment:  1. Cellulitis, unspecified cellulitis site   2. Allergic reaction to insect bite     Plan:  Depo-Medrol IM 40 mg given in office; Rx for Keflex 500 mg tid, Pepcid 20 mg bid and Zyrtec 1 tab po qhs; try to elevate the foot, ice the area and follow-up worse, no better.   No follow-ups on file.  No orders of the defined types were placed in this encounter.   Requested Prescriptions   Signed Prescriptions Disp Refills  . famotidine (PEPCID) 20 MG tablet 30 tablet 0    Sig: Take 1 tablet (20 mg total) by mouth 2 (two) times daily.  . cetirizine (ZYRTEC) 10 MG tablet 30 tablet 0    Sig: Take 1 tablet (10 mg total) by mouth at bedtime.  . cephALEXin (KEFLEX) 500 MG capsule 15 capsule 0    Sig: Take 1 capsule (500 mg total) by mouth 3 (three) times daily.

## 2019-03-16 ENCOUNTER — Other Ambulatory Visit: Payer: Self-pay | Admitting: Family

## 2019-03-26 ENCOUNTER — Other Ambulatory Visit: Payer: Self-pay | Admitting: Internal Medicine

## 2019-03-26 ENCOUNTER — Other Ambulatory Visit: Payer: Self-pay | Admitting: Family

## 2019-03-26 DIAGNOSIS — I10 Essential (primary) hypertension: Secondary | ICD-10-CM

## 2019-03-28 ENCOUNTER — Other Ambulatory Visit: Payer: Self-pay | Admitting: Internal Medicine

## 2019-03-28 DIAGNOSIS — K2100 Gastro-esophageal reflux disease with esophagitis, without bleeding: Secondary | ICD-10-CM

## 2019-05-04 ENCOUNTER — Other Ambulatory Visit: Payer: Self-pay | Admitting: Internal Medicine

## 2019-05-04 DIAGNOSIS — Z1231 Encounter for screening mammogram for malignant neoplasm of breast: Secondary | ICD-10-CM

## 2019-05-08 ENCOUNTER — Ambulatory Visit
Admission: RE | Admit: 2019-05-08 | Discharge: 2019-05-08 | Disposition: A | Discharge status: Home / Self Care | Payer: Medicare Other | Source: Ambulatory Visit | Attending: Internal Medicine | Admitting: Internal Medicine

## 2019-05-08 ENCOUNTER — Other Ambulatory Visit: Payer: Self-pay

## 2019-05-08 DIAGNOSIS — Z1231 Encounter for screening mammogram for malignant neoplasm of breast: Secondary | ICD-10-CM | POA: Diagnosis not present

## 2019-05-08 LAB — HM MAMMOGRAPHY

## 2019-05-28 ENCOUNTER — Telehealth: Payer: Self-pay | Admitting: Internal Medicine

## 2019-05-28 NOTE — Telephone Encounter (Signed)
Patient has been submitted for insurance approval for Prolia injection through the [employer] Portal.  °Waiting on benefits. °Will contact patient once these have been received. °

## 2019-07-14 ENCOUNTER — Other Ambulatory Visit: Payer: Self-pay | Admitting: Internal Medicine

## 2019-07-14 DIAGNOSIS — I10 Essential (primary) hypertension: Secondary | ICD-10-CM

## 2019-10-11 ENCOUNTER — Other Ambulatory Visit: Payer: Self-pay | Admitting: Internal Medicine

## 2019-10-11 DIAGNOSIS — K2100 Gastro-esophageal reflux disease with esophagitis, without bleeding: Secondary | ICD-10-CM

## 2019-10-11 DIAGNOSIS — I10 Essential (primary) hypertension: Secondary | ICD-10-CM

## 2019-10-22 ENCOUNTER — Ambulatory Visit (INDEPENDENT_AMBULATORY_CARE_PROVIDER_SITE_OTHER): Payer: Medicare Other | Admitting: Internal Medicine

## 2019-10-22 ENCOUNTER — Encounter: Payer: Self-pay | Admitting: Internal Medicine

## 2019-10-22 ENCOUNTER — Other Ambulatory Visit: Payer: Self-pay

## 2019-10-22 VITALS — BP 138/74 | HR 65 | Temp 98.5°F | Resp 16 | Ht 59.0 in | Wt 90.0 lb

## 2019-10-22 DIAGNOSIS — F17200 Nicotine dependence, unspecified, uncomplicated: Secondary | ICD-10-CM

## 2019-10-22 DIAGNOSIS — I10 Essential (primary) hypertension: Secondary | ICD-10-CM | POA: Diagnosis not present

## 2019-10-22 DIAGNOSIS — J449 Chronic obstructive pulmonary disease, unspecified: Secondary | ICD-10-CM | POA: Diagnosis not present

## 2019-10-22 DIAGNOSIS — E7800 Pure hypercholesterolemia, unspecified: Secondary | ICD-10-CM

## 2019-10-22 DIAGNOSIS — Z0000 Encounter for general adult medical examination without abnormal findings: Secondary | ICD-10-CM

## 2019-10-22 DIAGNOSIS — E559 Vitamin D deficiency, unspecified: Secondary | ICD-10-CM | POA: Diagnosis not present

## 2019-10-22 MED ORDER — TRELEGY ELLIPTA 100-62.5-25 MCG/INH IN AEPB: 1.0000 | INHALATION_SPRAY | Freq: Every day | RESPIRATORY_TRACT | 1 refills | Status: DC

## 2019-10-22 NOTE — Patient Instructions (Signed)
Health Maintenance, Female Adopting a healthy lifestyle and getting preventive care are important in promoting health and wellness. Ask your health care provider about:  The right schedule for you to have regular tests and exams.  Things you can do on your own to prevent diseases and keep yourself healthy. What should I know about diet, weight, and exercise? Eat a healthy diet   Eat a diet that includes plenty of vegetables, fruits, low-fat dairy products, and lean protein.  Do not eat a lot of foods that are high in solid fats, added sugars, or sodium. Maintain a healthy weight Body mass index (BMI) is used to identify weight problems. It estimates body fat based on height and weight. Your health care provider can help determine your BMI and help you achieve or maintain a healthy weight. Get regular exercise Get regular exercise. This is one of the most important things you can do for your health. Most adults should:  Exercise for at least 150 minutes each week. The exercise should increase your heart rate and make you sweat (moderate-intensity exercise).  Do strengthening exercises at least twice a week. This is in addition to the moderate-intensity exercise.  Spend less time sitting. Even light physical activity can be beneficial. Watch cholesterol and blood lipids Have your blood tested for lipids and cholesterol at 67 years of age, then have this test every 5 years. Have your cholesterol levels checked more often if:  Your lipid or cholesterol levels are high.  You are older than 67 years of age.  You are at high risk for heart disease. What should I know about cancer screening? Depending on your health history and family history, you may need to have cancer screening at various ages. This may include screening for:  Breast cancer.  Cervical cancer.  Colorectal cancer.  Skin cancer.  Lung cancer. What should I know about heart disease, diabetes, and high blood  pressure? Blood pressure and heart disease  High blood pressure causes heart disease and increases the risk of stroke. This is more likely to develop in people who have high blood pressure readings, are of African descent, or are overweight.  Have your blood pressure checked: ? Every 3-5 years if you are 18-39 years of age. ? Every year if you are 40 years old or older. Diabetes Have regular diabetes screenings. This checks your fasting blood sugar level. Have the screening done:  Once every three years after age 40 if you are at a normal weight and have a low risk for diabetes.  More often and at a younger age if you are overweight or have a high risk for diabetes. What should I know about preventing infection? Hepatitis B If you have a higher risk for hepatitis B, you should be screened for this virus. Talk with your health care provider to find out if you are at risk for hepatitis B infection. Hepatitis C Testing is recommended for:  Everyone born from 1945 through 1965.  Anyone with known risk factors for hepatitis C. Sexually transmitted infections (STIs)  Get screened for STIs, including gonorrhea and chlamydia, if: ? You are sexually active and are younger than 67 years of age. ? You are older than 67 years of age and your health care provider tells you that you are at risk for this type of infection. ? Your sexual activity has changed since you were last screened, and you are at increased risk for chlamydia or gonorrhea. Ask your health care provider if   you are at risk.  Ask your health care provider about whether you are at high risk for HIV. Your health care provider may recommend a prescription medicine to help prevent HIV infection. If you choose to take medicine to prevent HIV, you should first get tested for HIV. You should then be tested every 3 months for as long as you are taking the medicine. Pregnancy  If you are about to stop having your period (premenopausal) and  you may become pregnant, seek counseling before you get pregnant.  Take 400 to 800 micrograms (mcg) of folic acid every day if you become pregnant.  Ask for birth control (contraception) if you want to prevent pregnancy. Osteoporosis and menopause Osteoporosis is a disease in which the bones lose minerals and strength with aging. This can result in bone fractures. If you are 65 years old or older, or if you are at risk for osteoporosis and fractures, ask your health care provider if you should:  Be screened for bone loss.  Take a calcium or vitamin D supplement to lower your risk of fractures.  Be given hormone replacement therapy (HRT) to treat symptoms of menopause. Follow these instructions at home: Lifestyle  Do not use any products that contain nicotine or tobacco, such as cigarettes, e-cigarettes, and chewing tobacco. If you need help quitting, ask your health care provider.  Do not use street drugs.  Do not share needles.  Ask your health care provider for help if you need support or information about quitting drugs. Alcohol use  Do not drink alcohol if: ? Your health care provider tells you not to drink. ? You are pregnant, may be pregnant, or are planning to become pregnant.  If you drink alcohol: ? Limit how much you use to 0-1 drink a day. ? Limit intake if you are breastfeeding.  Be aware of how much alcohol is in your drink. In the [place] one drink equals one 12 oz bottle of beer (355 mL), one 5 oz glass of wine (148 mL), or one 1 oz glass of hard liquor (44 mL). General instructions  Schedule regular health, dental, and eye exams.  Stay current with your vaccines.  Tell your health care provider if: ? You often feel depressed. ? You have ever been abused or do not feel safe at home. Summary  Adopting a healthy lifestyle and getting preventive care are important in promoting health and wellness.  Follow your health care provider's instructions about healthy  diet, exercising, and getting tested or screened for diseases.  Follow your health care provider's instructions on monitoring your cholesterol and blood pressure. This information is not intended to replace advice given to you by your health care provider. Make sure you discuss any questions you have with your health care provider. Document Revised: 07/12/2018 Document Reviewed: 07/12/2018 Elsevier Patient Education  2020 [employer]vier Inc.  

## 2019-10-22 NOTE — Progress Notes (Signed)
Subjective:  Patient ID: Amber Griffith, female    DOB: 1951-06-03  Age: 69 y.o. MRN: [id number]  CC: Hypertension, Hyperlipidemia, COPD, and Annual Exam  This visit occurred during the SARS-CoV-2 public health emergency.  Safety protocols were in place, including screening questions prior to the visit, additional usage of staff PPE, and extensive cleaning of exam room while observing appropriate contact time as indicated for disinfecting solutions.    HPI Amber Griffith presents for a CPX.  She continues to smoke cigarettes and drink beer.  She has rare cough but does not produce any blood or phlegm.  She has had no recent episodes of wheezing, shortness of breath, chest pain, diaphoresis, fever, or chills.  She tells me her blood pressure is well controlled on the combination of triamterene and hydrochlorothiazide.  Outpatient Medications Prior to Visit  Medication Sig Dispense Refill  . aspirin 81 MG tablet Take 81 mg by mouth daily.      Marland Kitchen omeprazole (PRILOSEC) 40 MG capsule TAKE 1 CAPSULE BY MOUTH EVERY DAY 90 capsule 1  . cetirizine (ZYRTEC) 10 MG tablet Take 1 tablet (10 mg total) by mouth at bedtime. 30 tablet 0  . clobetasol ointment (TEMOVATE) 0.05 % APPLY TO AFFECTED AREA TWICE A DAY 30 g 0  . famotidine (PEPCID) 20 MG tablet Take 1 tablet (20 mg total) by mouth 2 (two) times daily. 30 tablet 0  . Fluticasone-Umeclidin-Vilant (TRELEGY ELLIPTA) 100-62.5-25 MCG/INH AEPB Inhale 1 puff into the lungs daily. 90 each 1  . rosuvastatin (CRESTOR) 5 MG tablet Take 1 tablet (5 mg total) by mouth daily. 90 tablet 0  . triamterene-hydrochlorothiazide (DYAZIDE) 37.5-25 MG capsule TAKE 1 CAPSULE BY MOUTH EVERY DAY 30 capsule 2  . Vitamin D, Ergocalciferol, (DRISDOL) 1.25 MG (50000 UT) CAPS capsule TAKE ONE CAPSULE BY MOUTH EVERY 7 DAYS 12 capsule 0  . cephALEXin (KEFLEX) 500 MG capsule Take 1 capsule (500 mg total) by mouth 3 (three) times daily. 15 capsule 0   No  facility-administered medications prior to visit.    ROS Review of Systems  Constitutional: Negative.  Negative for appetite change, diaphoresis, fatigue and unexpected weight change.  HENT: Negative.  Negative for trouble swallowing.   Eyes: Negative.   Respiratory: Positive for cough. Negative for chest tightness, shortness of breath and wheezing.   Cardiovascular: Negative for chest pain, palpitations and leg swelling.  Gastrointestinal: Negative for abdominal pain, constipation, diarrhea, nausea and vomiting.  Genitourinary: Negative.  Negative for difficulty urinating and dysuria.  Musculoskeletal: Negative.  Negative for arthralgias and myalgias.  Skin: Negative.  Negative for color change, pallor and rash.  Neurological: Negative.  Negative for dizziness, weakness, light-headedness and headaches.  Hematological: Negative for adenopathy. Does not bruise/bleed easily.  Psychiatric/Behavioral: Negative.     Objective:  BP 138/74 (BP Location: Left Arm, Patient Position: Sitting, Cuff Size: Normal)   Pulse 65   Temp 98.5 F (36.9 C) (Oral)   Resp 16   Ht 4\' 11"  (1.499 m)   Wt 90 lb (40.8 kg)   SpO2 95%   BMI 18.18 kg/m   BP Readings from Last 3 Encounters:  10/22/19 138/74  03/05/19 134/80  01/02/19 (!) 180/80    Wt Readings from Last 3 Encounters:  10/22/19 90 lb (40.8 kg)  03/05/19 92 lb 6.4 oz (41.9 kg)  01/02/19 89 lb (40.4 kg)    Physical Exam Vitals reviewed.  HENT:     Nose: Nose normal.     Mouth/Throat:  Mouth: Mucous membranes are moist.  Eyes:     General: No scleral icterus.    Conjunctiva/sclera: Conjunctivae normal.  Cardiovascular:     Rate and Rhythm: Normal rate and regular rhythm.     Heart sounds: No murmur. No gallop.   Pulmonary:     Effort: Pulmonary effort is normal.     Breath sounds: No stridor. No wheezing, rhonchi or rales.  Abdominal:     General: Abdomen is flat. Bowel sounds are normal. There is no distension.      Palpations: Abdomen is soft. There is no hepatomegaly, splenomegaly or mass.     Tenderness: There is no abdominal tenderness. There is no guarding.  Musculoskeletal:        General: Normal range of motion.     Cervical back: Neck supple.     Right lower leg: No edema.     Left lower leg: No edema.  Lymphadenopathy:     Cervical: No cervical adenopathy.  Skin:    General: Skin is warm and dry.     Coloration: Skin is not pale.  Neurological:     General: No focal deficit present.     Mental Status: She is alert.     Lab Results  Component Value Date   WBC 6.9 10/22/2019   HGB 12.4 10/22/2019   HCT 37.8 10/22/2019   PLT 222.0 10/22/2019   GLUCOSE 93 10/22/2019   CHOL 169 10/22/2019   TRIG 116.0 10/22/2019   HDL 60.40 10/22/2019   LDLDIRECT 84.0 01/25/2018   LDLCALC 86 10/22/2019   ALT 12 10/22/2019   AST 14 10/22/2019   NA 140 10/22/2019   K 3.6 10/22/2019   CL 104 10/22/2019   CREATININE 0.85 10/22/2019   BUN 23 10/22/2019   CO2 30 10/22/2019   TSH 2.37 10/22/2019   HGBA1C 4.9 07/30/2013    MM 3D SCREEN BREAST BILATERAL  Result Date: 05/08/2019 CLINICAL DATA:  Screening. EXAM: DIGITAL SCREENING BILATERAL MAMMOGRAM WITH TOMO AND CAD COMPARISON:  Previous exam(s). ACR Breast Density Category d: The breast tissue is extremely dense, which lowers the sensitivity of mammography FINDINGS: There are no findings suspicious for malignancy. Images were processed with CAD. IMPRESSION: No mammographic evidence of malignancy. A result letter of this screening mammogram will be mailed directly to the patient. RECOMMENDATION: Screening mammogram in one year. (Code:SM-B-01Y) BI-RADS CATEGORY  1: Negative. Electronically Signed   By: Emmaline Kluver M.D.   On: 05/08/2019 14:18    Assessment & Plan:   Amber Griffith was seen today for hypertension, hyperlipidemia, copd and annual exam.  Diagnoses and all orders for this visit:  Essential hypertension- Her blood pressure is adequately  well controlled.  Electrolytes and renal function are normal.  Will continue the combination of triamterene and hydrochlorothiazide. -     Basic metabolic panel; Future -     TSH; Future -     Urinalysis, Routine w reflex microscopic; Future -     CBC with Differential/Platelet; Future -     CBC with Differential/Platelet -     Urinalysis, Routine w reflex microscopic -     TSH -     Basic metabolic panel -     triamterene-hydrochlorothiazide (DYAZIDE) 37.5-25 MG capsule; Take 1 each (1 capsule total) by mouth daily.  Routine general medical examination at a health care facility- Exam completed, labs reviewed, vaccines reviewed, cancer screenings are up-to-date, patient education material was given.  Vitamin D deficiency- She is not taking a vitamin D  supplement and her vitamin D level is normal. -     VITAMIN D 25 Hydroxy (Vit-D Deficiency, Fractures); Future -     VITAMIN D 25 Hydroxy (Vit-D Deficiency, Fractures)  Pure hypercholesterolemia- She has an elevated ASCVD risk score.  I have asked her to take a statin for CV risk reduction. -     Lipid panel; Future -     TSH; Future -     Hepatic function panel; Future -     Hepatic function panel -     TSH -     Lipid panel -     rosuvastatin (CRESTOR) 5 MG tablet; Take 1 tablet (5 mg total) by mouth daily.  COPD (chronic obstructive pulmonary disease) with chronic bronchitis ([place])- I have asked her to quit smoking and to continue use the triple inhaler. -     Fluticasone-Umeclidin-Vilant (TRELEGY ELLIPTA) 100-62.5-25 MCG/INH AEPB; Inhale 1 puff into the lungs daily.  TOBACCO ABUSE -     Ambulatory Referral for Lung Cancer Scre   I have discontinued Reana L. Gellis's Vitamin D (Ergocalciferol), clobetasol ointment, famotidine, cetirizine, and cephALEXin. I have also changed her triamterene-hydrochlorothiazide. Additionally, I am having her maintain her aspirin, omeprazole, Trelegy Ellipta, and rosuvastatin.  Meds ordered this  encounter  Medications  . Fluticasone-Umeclidin-Vilant (TRELEGY ELLIPTA) 100-62.5-25 MCG/INH AEPB    Sig: Inhale 1 puff into the lungs daily.    Dispense:  90 each    Refill:  1  . triamterene-hydrochlorothiazide (DYAZIDE) 37.5-25 MG capsule    Sig: Take 1 each (1 capsule total) by mouth daily.    Dispense:  90 capsule    Refill:  1  . rosuvastatin (CRESTOR) 5 MG tablet    Sig: Take 1 tablet (5 mg total) by mouth daily.    Dispense:  90 tablet    Refill:  1     Follow-up: Return in about 6 months (around 04/23/2020).  Scarlette Calico, MD

## 2019-10-23 ENCOUNTER — Encounter: Payer: Self-pay | Admitting: Internal Medicine

## 2019-10-23 LAB — CBC WITH DIFFERENTIAL/PLATELET
Basophils Absolute: 0.1 10*3/uL (ref 0.0–0.1)
Basophils Relative: 0.9 % (ref 0.0–3.0)
Eosinophils Absolute: 0.1 10*3/uL (ref 0.0–0.7)
Eosinophils Relative: 1.3 % (ref 0.0–5.0)
HCT: 37.8 % (ref 36.0–46.0)
Hemoglobin: 12.4 g/dL (ref 12.0–15.0)
Lymphocytes Relative: 41.8 % (ref 12.0–46.0)
Lymphs Abs: 2.9 10*3/uL (ref 0.7–4.0)
MCHC: 32.9 g/dL (ref 30.0–36.0)
MCV: 91.4 fl (ref 78.0–100.0)
Monocytes Absolute: 0.8 10*3/uL (ref 0.1–1.0)
Monocytes Relative: 11.6 % (ref 3.0–12.0)
Neutro Abs: 3.1 10*3/uL (ref 1.4–7.7)
Neutrophils Relative %: 44.4 % (ref 43.0–77.0)
Platelets: 222 10*3/uL (ref 150.0–400.0)
RBC: 4.13 Mil/uL (ref 3.87–5.11)
RDW: 12.6 % (ref 11.5–15.5)
WBC: 6.9 10*3/uL (ref 4.0–10.5)

## 2019-10-23 LAB — HEPATIC FUNCTION PANEL
ALT: 12 U/L (ref 0–35)
AST: 14 U/L (ref 0–37)
Albumin: 4.4 g/dL (ref 3.5–5.2)
Alkaline Phosphatase: 67 U/L (ref 39–117)
Bilirubin, Direct: 0 mg/dL (ref 0.0–0.3)
Total Bilirubin: 0.4 mg/dL (ref 0.2–1.2)
Total Protein: 7.1 g/dL (ref 6.0–8.3)

## 2019-10-23 LAB — LIPID PANEL
Cholesterol: 169 mg/dL (ref 0–200)
HDL: 60.4 mg/dL (ref >39.00)
LDL Cholesterol: 86 mg/dL (ref 0–99)
NonHDL: 109.09
Total CHOL/HDL Ratio: 3
Triglycerides: 116 mg/dL (ref 0.0–149.0)
VLDL: 23.2 mg/dL (ref 0.0–40.0)

## 2019-10-23 LAB — BASIC METABOLIC PANEL
BUN: 23 mg/dL (ref 6–23)
CO2: 30 mEq/L (ref 19–32)
Calcium: 9.4 mg/dL (ref 8.4–10.5)
Chloride: 104 mEq/L (ref 96–112)
Creatinine, Ser: 0.85 mg/dL (ref 0.40–1.20)
GFR: 80.67 mL/min (ref >60.00)
Glucose, Bld: 93 mg/dL (ref 70–99)
Potassium: 3.6 mEq/L (ref 3.5–5.1)
Sodium: 140 mEq/L (ref 135–145)

## 2019-10-23 LAB — URINALYSIS, ROUTINE W REFLEX MICROSCOPIC
Bilirubin Urine: NEGATIVE
Ketones, ur: NEGATIVE
Leukocytes,Ua: NEGATIVE
Nitrite: NEGATIVE
Specific Gravity, Urine: 1.025 (ref 1.000–1.030)
Total Protein, Urine: NEGATIVE
Urine Glucose: NEGATIVE
Urobilinogen, UA: 0.2 (ref 0.0–1.0)
pH: 5.5 (ref 5.0–8.0)

## 2019-10-23 LAB — TSH: TSH: 2.37 u[IU]/mL (ref 0.35–4.50)

## 2019-10-23 LAB — VITAMIN D 25 HYDROXY (VIT D DEFICIENCY, FRACTURES): VITD: 33.8 ng/mL (ref 30.00–100.00)

## 2019-10-23 MED ORDER — TRIAMTERENE-HCTZ 37.5-25 MG PO CAPS: 1.0000 | ORAL_CAPSULE | Freq: Every day | ORAL | 1 refills | Status: DC

## 2019-10-23 MED ORDER — ROSUVASTATIN CALCIUM 5 MG PO TABS: 5.0000 mg | ORAL_TABLET | Freq: Every day | ORAL | 1 refills | Status: DC

## 2019-10-29 ENCOUNTER — Telehealth: Payer: Self-pay | Admitting: Internal Medicine

## 2019-10-29 DIAGNOSIS — K2100 Gastro-esophageal reflux disease with esophagitis, without bleeding: Secondary | ICD-10-CM

## 2019-10-29 NOTE — Telephone Encounter (Signed)
    1.Medication Requested: omeprazole (PRILOSEC) 40 MG capsule  2. Pharmacy (Name, Street, City):[facility] [phone] - [place], [place] - [address]  3. On Med List: yes  4. Last Visit with PCP:  10/22/19  5. Next visit date with PCP:   Agent: Please be advised that RX refills may take up to 3 business days. We ask that you follow-up with your pharmacy.

## 2019-10-30 MED ORDER — OMEPRAZOLE 40 MG PO CPDR: 40.0000 mg | DELAYED_RELEASE_CAPSULE | Freq: Every day | ORAL | 1 refills | Status: DC

## 2019-10-30 NOTE — Telephone Encounter (Signed)
erx sent as requested.  

## 2019-10-31 ENCOUNTER — Telehealth: Payer: Self-pay | Admitting: Internal Medicine

## 2019-10-31 NOTE — Telephone Encounter (Signed)
Insurance has been submitted and verified for Prolia. °Patient is responsible for a $235 copay. °Due anytime. °Left message for patient to call back to schedule.  °Okay to schedule... °Visit Note: Prolia ($235 copay - okay to give per [place]) °Visit Type: Nurse °Provider: Nurse °

## 2019-11-12 ENCOUNTER — Ambulatory Visit (INDEPENDENT_AMBULATORY_CARE_PROVIDER_SITE_OTHER): Payer: Medicare Other | Admitting: Family

## 2019-11-12 DIAGNOSIS — J209 Acute bronchitis, unspecified: Secondary | ICD-10-CM | POA: Diagnosis not present

## 2019-11-12 DIAGNOSIS — J449 Chronic obstructive pulmonary disease, unspecified: Secondary | ICD-10-CM | POA: Diagnosis not present

## 2019-11-12 MED ORDER — ALBUTEROL SULFATE HFA 108 (90 BASE) MCG/ACT IN AERS: 2.0000 | INHALATION_SPRAY | Freq: Four times a day (QID) | RESPIRATORY_TRACT | 1 refills | Status: DC | PRN

## 2019-11-12 MED ORDER — DOXYCYCLINE HYCLATE 100 MG PO TABS: 100.0000 mg | ORAL_TABLET | Freq: Two times a day (BID) | ORAL | 0 refills | Status: DC

## 2019-11-12 MED ORDER — PREDNISONE 20 MG PO TABS: 20.0000 mg | ORAL_TABLET | Freq: Every day | ORAL | 0 refills | Status: DC

## 2019-11-12 NOTE — Progress Notes (Signed)
Amber Griffith is a 69 y.o. female with the following history as recorded in Epic[employer]:  Patient Active Problem List   Diagnosis Date Noted  . Esophageal dysphagia 09/25/2018  . Vitamin D deficiency 08/31/2016  . Cervical cancer screening 08/30/2016  . Visit for screening mammogram 08/30/2016  . Chronic midline thoracic back pain 08/30/2016  . Chronic low back pain without sciatica 06/23/2015  . Compression fracture of lumbar spine, non-traumatic (HCC) 06/23/2015  . Osteoporosis 06/23/2015  . Atopic dermatitis 11/10/2012  . Pure hypercholesterolemia 03/01/2012  . Hemorrhoids without complication 03/01/2012  . Anxiety and depression 12/24/2010  . Insomnia 12/24/2010  . Routine general medical examination at a health care facility 12/24/2010  . Cardiomegaly 01/17/2009  . CONSTIPATION, CHRONIC 12/18/2008  . ALLERGIC RHINITIS 09/01/2007  . Essential hypertension 06/12/2007  . TOBACCO ABUSE 04/04/2007  . COPD (chronic obstructive pulmonary disease) with chronic bronchitis (HCC) 04/04/2007  . GERD 04/04/2007    Current Outpatient Medications  Medication Sig Dispense Refill  . Fluticasone-Umeclidin-Vilant (TRELEGY ELLIPTA) 100-62.5-25 MCG/INH AEPB Inhale 1 puff into the lungs daily. 90 each 1  . omeprazole (PRILOSEC) 40 MG capsule Take 1 capsule (40 mg total) by mouth daily. 90 capsule 1  . rosuvastatin (CRESTOR) 5 MG tablet Take 1 tablet (5 mg total) by mouth daily. 90 tablet 1  . triamterene-hydrochlorothiazide (DYAZIDE) 37.5-25 MG capsule Take 1 each (1 capsule total) by mouth daily. 90 capsule 1  . albuterol (VENTOLIN HFA) 108 (90 Base) MCG/ACT inhaler Inhale 2 puffs into the lungs every 6 (six) hours as needed for wheezing or shortness of breath. 6.7 g 1  . doxycycline (VIBRA-TABS) 100 MG tablet Take 1 tablet (100 mg total) by mouth 2 (two) times daily. 20 tablet 0  . predniSONE (DELTASONE) 20 MG tablet Take 1 tablet (20 mg total) by mouth daily with breakfast. 5 tablet 0   No  current facility-administered medications for this visit.    Allergies: Patient has no known allergies.  Past Medical History:  Diagnosis Date  . ALLERGIC RHINITIS 09/01/2007  . Anxiety and depression 12/24/2010  . Cardiomegaly 01/17/2009  . CONSTIPATION, CHRONIC 12/18/2008  . COPD 04/04/2007  . ELECTROCARDIOGRAM, ABNORMAL 01/17/2009  . GERD 04/04/2007  . HEADACHE, TENSION 04/18/2008  . HEMORRHOIDS 09/25/2007  . HYPERTENSION 06/12/2007  . SINUSITIS- ACUTE-NOS 05/08/2010  . TOBACCO ABUSE 04/04/2007  . UTI 09/30/2010  . Vaginitis and vulvovaginitis, unspecified 09/30/2010    Past Surgical History:  Procedure Laterality Date  . AMPUTATION Right 09/23/2017   Procedure: REVISION AMPUTATION RIGHT INDEX FINGER;  Surgeon: Betha Loa, MD;  Location: [facility];  Service: Orthopedics;  Laterality: Right;  . BREAST BIOPSY Left   . s/p breast bx     left  . s/p umbilical hernia    . TONSILLECTOMY    . TUBAL LIGATION      Family History  Problem Relation Age of Onset  . Asthma Mother   . Hypertension Mother   . COPD Maternal Uncle   . Hypertension Father   . Seizures Father   . Stroke Paternal Uncle     Social History   Tobacco Use  . Smoking status: Current Every Day Smoker    Packs/day: 1.00    Years: 35.00    Pack years: 35.00    Types: Cigarettes  . Smokeless tobacco: Never Used  Substance Use Topics  . Alcohol use: Yes    Alcohol/week: 28.0 standard drinks    Types: 28 Cans of beer per week  Subjective:   I connected with Amber Griffith on 11/12/19 at  1:40 PM EDT by a video enabled telemedicine application and verified that I am speaking with the correct person using two identifiers.   I discussed the limitations of evaluation and management by telemedicine and the availability of in person appointments. The patient expressed understanding and agreed to proceed. Provider in office/ patient is at home; provider and patient are only 2 people on video call.    Known history of COPD; "complaining of bad cough"; prone to recurrent bronchitis; has had both of her COVID vaccines; no fever; feels like she can't take a full breath;    Objective:  There were no vitals filed for this visit.  General: Well developed, well nourished, in no acute distress  Skin : Warm and dry.  Head: Normocephalic and atraumatic  Lungs: Respirations unlabored;  Neurologic: Alert and oriented; speech intact;  Assessment:  1. COPD (chronic obstructive pulmonary disease) with chronic bronchitis ([place])   2. Acute bronchitis, unspecified organism     Plan:  Rx for Prednisone 20 mg qd x 5 days, Doxycyline 100 mg bid x 10 days, albuterol inhaler; Will need to follow-up in office if symptoms persist.   No follow-ups on file.  No orders of the defined types were placed in this encounter.   Requested Prescriptions   Signed Prescriptions Disp Refills  . doxycycline (VIBRA-TABS) 100 MG tablet 20 tablet 0    Sig: Take 1 tablet (100 mg total) by mouth 2 (two) times daily.  . predniSONE (DELTASONE) 20 MG tablet 5 tablet 0    Sig: Take 1 tablet (20 mg total) by mouth daily with breakfast.  . albuterol (VENTOLIN HFA) 108 (90 Base) MCG/ACT inhaler 6.7 g 1    Sig: Inhale 2 puffs into the lungs every 6 (six) hours as needed for wheezing or shortness of breath.

## 2019-11-19 ENCOUNTER — Encounter: Payer: Self-pay | Admitting: Internal Medicine

## 2019-11-19 ENCOUNTER — Ambulatory Visit (INDEPENDENT_AMBULATORY_CARE_PROVIDER_SITE_OTHER): Payer: Medicare Other

## 2019-11-19 ENCOUNTER — Ambulatory Visit (INDEPENDENT_AMBULATORY_CARE_PROVIDER_SITE_OTHER): Payer: Medicare Other | Admitting: Internal Medicine

## 2019-11-19 ENCOUNTER — Other Ambulatory Visit: Payer: Self-pay

## 2019-11-19 VITALS — BP 138/76 | HR 79 | Temp 98.0°F | Resp 20 | Ht 59.0 in | Wt 90.0 lb

## 2019-11-19 DIAGNOSIS — J849 Interstitial pulmonary disease, unspecified: Secondary | ICD-10-CM | POA: Diagnosis not present

## 2019-11-19 DIAGNOSIS — R059 Cough, unspecified: Secondary | ICD-10-CM

## 2019-11-19 DIAGNOSIS — R05 Cough: Secondary | ICD-10-CM

## 2019-11-19 DIAGNOSIS — R1310 Dysphagia, unspecified: Secondary | ICD-10-CM | POA: Diagnosis not present

## 2019-11-19 DIAGNOSIS — R1319 Other dysphagia: Secondary | ICD-10-CM

## 2019-11-19 DIAGNOSIS — K2100 Gastro-esophageal reflux disease with esophagitis, without bleeding: Secondary | ICD-10-CM

## 2019-11-19 MED ORDER — GAVISCON EXTRA STRENGTH 254-237.5 MG/5ML PO SUSP: 5.0000 mL | Freq: Three times a day (TID) | ORAL | 0 refills | Status: DC | PRN

## 2019-11-19 NOTE — Patient Instructions (Signed)

## 2019-11-19 NOTE — Progress Notes (Signed)
Subjective:  Patient ID: Amber Griffith, female    DOB: 1951-03-23  Age: 69 y.o. MRN: [id number]  CC: Cough and Gastroesophageal Reflux  This visit occurred during the SARS-CoV-2 public health emergency.  Safety protocols were in place, including screening questions prior to the visit, additional usage of staff PPE, and extensive cleaning of exam room while observing appropriate contact time as indicated for disinfecting solutions.    HPI Amber Griffith presents for f/up - She did a virtual visit with someone else about a week ago with symptoms of COPD exacerbation and was prescribed doxycycline and prednisone.  Since then she complains of odynophagia, dysphagia, heartburn, belching, and indigestion.  She continues to cough but says the phlegm is clear.  She is getting some symptom relief with her current medications.  Outpatient Medications Prior to Visit  Medication Sig Dispense Refill  . albuterol (VENTOLIN HFA) 108 (90 Base) MCG/ACT inhaler Inhale 2 puffs into the lungs every 6 (six) hours as needed for wheezing or shortness of breath. 6.7 g 1  . Fluticasone-Umeclidin-Vilant (TRELEGY ELLIPTA) 100-62.5-25 MCG/INH AEPB Inhale 1 puff into the lungs daily. 90 each 1  . omeprazole (PRILOSEC) 40 MG capsule Take 1 capsule (40 mg total) by mouth daily. 90 capsule 1  . rosuvastatin (CRESTOR) 5 MG tablet Take 1 tablet (5 mg total) by mouth daily. 90 tablet 1  . triamterene-hydrochlorothiazide (DYAZIDE) 37.5-25 MG capsule Take 1 each (1 capsule total) by mouth daily. 90 capsule 1  . doxycycline (VIBRA-TABS) 100 MG tablet Take 1 tablet (100 mg total) by mouth 2 (two) times daily. 20 tablet 0  . predniSONE (DELTASONE) 20 MG tablet Take 1 tablet (20 mg total) by mouth daily with breakfast. 5 tablet 0   No facility-administered medications prior to visit.    ROS Review of Systems  Constitutional: Negative for appetite change, diaphoresis and unexpected weight change.  HENT: Positive for  trouble swallowing and voice change.        ++ heartburn, indigestion, dysphagia, odynophagia  Respiratory: Positive for cough, shortness of breath and wheezing. Negative for chest tightness and stridor.   Cardiovascular: Negative for chest pain, palpitations and leg swelling.  Gastrointestinal: Negative for abdominal pain, constipation, diarrhea, nausea and vomiting.  Genitourinary: Negative.  Negative for difficulty urinating.  Musculoskeletal: Negative for back pain and neck pain.  Skin: Negative.  Negative for color change.  Neurological: Negative.  Negative for dizziness, weakness and light-headedness.  Hematological: Negative for adenopathy. Does not bruise/bleed easily.  Psychiatric/Behavioral: Negative.     Objective:  BP 138/76 (BP Location: Left Arm, Patient Position: Sitting, Cuff Size: Normal)   Pulse 79   Temp 98 F (36.7 C) (Oral)   Resp 20   Ht 4\' 11"  (1.499 m)   Wt 90 lb (40.8 kg)   SpO2 99%   BMI 18.18 kg/m   BP Readings from Last 3 Encounters:  11/19/19 138/76  10/22/19 138/74  03/05/19 134/80    Wt Readings from Last 3 Encounters:  11/19/19 90 lb (40.8 kg)  10/22/19 90 lb (40.8 kg)  03/05/19 92 lb 6.4 oz (41.9 kg)    Physical Exam Vitals reviewed.  Constitutional:      General: She is not in acute distress.    Appearance: She is ill-appearing. She is not toxic-appearing or diaphoretic.  HENT:     Nose: Nose normal.     Mouth/Throat:     Mouth: Mucous membranes are moist.  Eyes:     General: No scleral  icterus.    Conjunctiva/sclera: Conjunctivae normal.  Cardiovascular:     Rate and Rhythm: Normal rate and regular rhythm.     Heart sounds: No murmur. No gallop.   Pulmonary:     Effort: Pulmonary effort is normal. No tachypnea or respiratory distress.     Breath sounds: No stridor or decreased air movement. Examination of the right-middle field reveals rhonchi. Examination of the left-middle field reveals rhonchi. Examination of the right-lower  field reveals rhonchi. Examination of the left-lower field reveals rhonchi. Rhonchi present. No decreased breath sounds, wheezing or rales.  Abdominal:     General: Abdomen is flat.     Palpations: There is no mass.     Tenderness: There is no abdominal tenderness. There is no guarding.  Musculoskeletal:        General: Normal range of motion.     Cervical back: Neck supple.     Right lower leg: No edema.  Lymphadenopathy:     Cervical: No cervical adenopathy.  Skin:    General: Skin is warm and dry.     Coloration: Skin is not pale.  Neurological:     General: No focal deficit present.     Mental Status: She is alert.  Psychiatric:        Mood and Affect: Mood normal.        Behavior: Behavior normal.     Lab Results  Component Value Date   WBC 6.9 10/22/2019   HGB 12.4 10/22/2019   HCT 37.8 10/22/2019   PLT 222.0 10/22/2019   GLUCOSE 93 10/22/2019   CHOL 169 10/22/2019   TRIG 116.0 10/22/2019   HDL 60.40 10/22/2019   LDLDIRECT 84.0 01/25/2018   LDLCALC 86 10/22/2019   ALT 12 10/22/2019   AST 14 10/22/2019   NA 140 10/22/2019   K 3.6 10/22/2019   CL 104 10/22/2019   CREATININE 0.85 10/22/2019   BUN 23 10/22/2019   CO2 30 10/22/2019   TSH 2.37 10/22/2019   HGBA1C 4.9 07/30/2013    MM 3D SCREEN BREAST BILATERAL  Result Date: 05/08/2019 CLINICAL DATA:  Screening. EXAM: DIGITAL SCREENING BILATERAL MAMMOGRAM WITH TOMO AND CAD COMPARISON:  Previous exam(s). ACR Breast Density Category d: The breast tissue is extremely dense, which lowers the sensitivity of mammography FINDINGS: There are no findings suspicious for malignancy. Images were processed with CAD. IMPRESSION: No mammographic evidence of malignancy. A result letter of this screening mammogram will be mailed directly to the patient. RECOMMENDATION: Screening mammogram in one year. (Code:SM-B-01Y) BI-RADS CATEGORY  1: Negative. Electronically Signed   By: Emmaline Kluver M.D.   On: 05/08/2019 14:18   DG Chest 2  View  Result Date: 11/19/2019 CLINICAL DATA:  Left anterior foot pain EXAM: CHEST - 2 VIEW COMPARISON:  05/12/2018 FINDINGS: There is bilateral chronic interstitial thickening. There is no focal consolidation. There is no pleural effusion or pneumothorax. The heart and mediastinal contours are unremarkable. There is no acute osseous abnormality. IMPRESSION: 1. No acute cardiopulmonary disease. 2. Bilateral chronic interstitial disease. Electronically Signed   By: Elige Ko   On: 11/19/2019 14:48    Assessment & Plan:   Houda was seen today for cough and gastroesophageal reflux.  Diagnoses and all orders for this visit:  Cough- Her chest x-ray is negative for mass or infiltrate. -     DG Chest 2 View; Future  Esophageal dysphagia- It sounds like this was triggered by the recent tetracycline antibiotic and steroids.  I have asked her  to stop taking doxycycline and prednisone.  Will upgrade her treatment for GERD with Gaviscon.  I have asked her to see GI to see if she needs to undergo upper endoscopy to screen for pathology like strictures, esophageal cancer, esophagitis, or esophageal ulcer. -     Ambulatory referral to Gastroenterology  Gastroesophageal reflux disease with esophagitis without hemorrhage -     Alum Hydroxide-Mag Carbonate (GAVISCON EXTRA STRENGTH) 508-475 MG/10ML SUSP; Take 5 mLs by mouth 3 (three) times daily as needed.   I have discontinued Sherline L. Wannamaker's doxycycline and predniSONE. I am also having her start on Gaviscon Extra Strength. Additionally, I am having her maintain her Trelegy Ellipta, triamterene-hydrochlorothiazide, rosuvastatin, omeprazole, and albuterol.  Meds ordered this encounter  Medications  . Alum Hydroxide-Mag Carbonate (GAVISCON EXTRA STRENGTH) 508-475 MG/10ML SUSP    Sig: Take 5 mLs by mouth 3 (three) times daily as needed.    Dispense:  355 mL    Refill:  0     Follow-up: Return in about 3 weeks (around 12/10/2019).  Sanda Linger,  MD

## 2019-11-20 ENCOUNTER — Encounter: Payer: Self-pay | Admitting: Gastroenterology

## 2019-11-30 ENCOUNTER — Other Ambulatory Visit: Payer: Self-pay | Admitting: *Deleted

## 2019-11-30 DIAGNOSIS — F17210 Nicotine dependence, cigarettes, uncomplicated: Secondary | ICD-10-CM

## 2019-11-30 DIAGNOSIS — Z87891 Personal history of nicotine dependence: Secondary | ICD-10-CM

## 2019-12-17 ENCOUNTER — Ambulatory Visit (INDEPENDENT_AMBULATORY_CARE_PROVIDER_SITE_OTHER): Payer: Medicare Other | Admitting: Acute Care

## 2019-12-17 ENCOUNTER — Encounter: Payer: Self-pay | Admitting: Acute Care

## 2019-12-17 ENCOUNTER — Ambulatory Visit (INDEPENDENT_AMBULATORY_CARE_PROVIDER_SITE_OTHER)
Admission: RE | Admit: 2019-12-17 | Discharge: 2019-12-17 | Disposition: A | Discharge status: Home / Self Care | Payer: Medicare Other | Source: Ambulatory Visit | Attending: Acute Care | Admitting: Acute Care

## 2019-12-17 ENCOUNTER — Other Ambulatory Visit: Payer: Self-pay

## 2019-12-17 DIAGNOSIS — Z87891 Personal history of nicotine dependence: Secondary | ICD-10-CM

## 2019-12-17 DIAGNOSIS — F17210 Nicotine dependence, cigarettes, uncomplicated: Secondary | ICD-10-CM

## 2019-12-17 DIAGNOSIS — Z122 Encounter for screening for malignant neoplasm of respiratory organs: Secondary | ICD-10-CM

## 2019-12-17 NOTE — Assessment & Plan Note (Signed)
SDMV done 12/17/2019 First scan due 12/17/2019

## 2019-12-17 NOTE — Progress Notes (Signed)
Shared Decision Making Visit Lung Cancer Screening Program [phone])   Eligibility:  Age 69 y.o.  Pack Years Smoking History Calculation 36 pack year smoking history (# packs/per year x # years smoked)  Recent History of coughing up blood  no  Unexplained weight loss? no ( >Than 15 pounds within the last 6 months )  Prior History Lung / other cancer no (Diagnosis within the last 5 years already requiring surveillance chest CT Scans).  Smoking Status Current Smoker  Former Smokers: Years since quit: NA  Quit Date: NA  Visit Components:  Discussion included one or more decision making aids. yes  Discussion included risk/benefits of screening. yes  Discussion included potential follow up diagnostic testing for abnormal scans. yes  Discussion included meaning and risk of over diagnosis. yes  Discussion included meaning and risk of False Positives. yes  Discussion included meaning of total radiation exposure. yes  Counseling Included:  Importance of adherence to annual lung cancer LDCT screening. yes  Impact of comorbidities on ability to participate in the program. yes  Ability and willingness to under diagnostic treatment. yes  Smoking Cessation Counseling:  Current Smokers:   Discussed importance of smoking cessation. yes  Information about tobacco cessation classes and interventions provided to patient. yes  Patient provided with "ticket" for LDCT Scan. yes  Symptomatic Patient. No  Counseling NA  Diagnosis Code: Tobacco Use Z72.0  Asymptomatic Patient yes  Counseling (Intermediate counseling: > three minutes counseling) [id number]  Former Smokers:   Discussed the importance of maintaining cigarette abstinence. yes  Diagnosis Code: Personal History of Nicotine Dependence. F63.846  Information about tobacco cessation classes and interventions provided to patient. Yes  Patient provided with "ticket" for LDCT Scan. yes  Written Order for Lung Cancer  Screening with LDCT placed in Epic. Yes (CT Chest Lung Cancer Screening Low Dose W/O CM) [id number] Z12.2-Screening of respiratory organs Z87.891-Personal history of nicotine dependence  I have spent 25 minutes of face to face time with Ms. Dyal discussing the risks and benefits of lung cancer screening. We viewed a power point together that explained in detail the above noted topics. We paused at intervals to allow for questions to be asked and answered to ensure understanding.We discussed that the single most powerful action that she can take to decrease her risk of developing lung cancer is to quit smoking. We discussed whether or not she is ready to commit to setting a quit date. We discussed options for tools to aid in quitting smoking including nicotine replacement therapy, non-nicotine medications, support groups, Quit Smart classes, and behavior modification. We discussed that often times setting smaller, more achievable goals, such as eliminating 1 cigarette a day for a week and then 2 cigarettes a day for a week can be helpful in slowly decreasing the number of cigarettes smoked. This allows for a sense of accomplishment as well as providing a clinical benefit. I gave her the " Be Stronger Than Your Excuses" card with contact information for community resources, classes, free nicotine replacement therapy, and access to mobile apps, text messaging, and on-line smoking cessation help. I have also given her my card and contact information in the event she needs to contact me. We discussed the time and location of the scan, and that either Abigail Miyamoto RN or I will call with the results within 24-48 hours of receiving them. I have offered her  a copy of the power point we viewed  as a resource in the event they need reinforcement  of the concepts we discussed today in the office. The patient verbalized understanding of all of  the above and had no further questions upon leaving the office. They have my  contact information in the event they have any further questions.  I spent 4 minutes counseling on smoking cessation and the health risks of continued tobacco abuse.  I explained to the patient that there has been a high incidence of coronary artery disease noted on these exams. I explained that this is a non-gated exam therefore degree or severity cannot be determined. This patient is on statin therapy. I have asked the patient to follow-up with their PCP regarding any incidental finding of coronary artery disease and management with diet or medication as their PCP  feels is clinically indicated. The patient verbalized understanding of the above and had no further questions upon completion of the visit.   Pt. Would like to have an appointment with the pharmacist to discuss smoking cessation options. We will refer for an appointment   Magdalen Spatz, NP

## 2019-12-17 NOTE — Patient Instructions (Signed)
Thank you for participating in the [facility] Scre[facility]. It was our pleasure to meet you today. We will call you with the results of your scan within the next few days. Your scan will be assigned a Lung RADS category score by the physicians reading the scans.  This Lung RADS score determines follow up scanning.  See below for description of categories, and follow up screening recommendations. We will be in touch to schedule your follow up screening annually or based on recommendations of our providers. We will fax a copy of your scan results to your Primary Care Physician, or the physician who referred you to the program, to ensure they have the results. Please call the office if you have any questions or concerns regarding your scanning experience or results.  Our office number is [phone]. Please speak with Denise Phelps, RN. She is our Lung Cancer Screening RN. If she is unavailable when you call, please have the office staff send her a message. She will return your call at her earliest convenience. Remember, if your scan is normal, we will scan you annually as long as you continue to meet the criteria for the program. (Age 55-77, Current smoker or smoker who has quit within the last 15 years). If you are a smoker, remember, quitting is the single most powerful action that you can take to decrease your risk of lung cancer and other pulmonary, breathing related problems. We know quitting is hard, and we are here to help.  Please let us know if there is anything we can do to help you meet your goal of quitting. If you are a former smoker, congratulations. We are proud of you! Remain smoke free! Remember you can refer friends or family members through the number above.  We will screen them to make sure they meet criteria for the program. Thank you for helping us take better care of you by participating in Lung Screening.  Lung RADS Categories:  Lung RADS 1: no nodules  or definitely non-concerning nodules.  Recommendation is for a repeat annual scan in 12 months.  Lung RADS 2:  nodules that are non-concerning in appearance and behavior with a very low likelihood of becoming an active cancer. Recommendation is for a repeat annual scan in 12 months.  Lung RADS 3: nodules that are probably non-concerning , includes nodules with a low likelihood of becoming an active cancer.  Recommendation is for a 6-month repeat screening scan. Often noted after an upper respiratory illness. We will be in touch to make sure you have no questions, and to schedule your 6-month scan.  Lung RADS 4 A: nodules with concerning findings, recommendation is most often for a follow up scan in 3 months or additional testing based on our provider's assessment of the scan. We will be in touch to make sure you have no questions and to schedule the recommended 3 month follow up scan.  Lung RADS 4 B:  indicates findings that are concerning. We will be in touch with you to schedule additional diagnostic testing based on our provider's  assessment of the scan.   

## 2019-12-20 ENCOUNTER — Other Ambulatory Visit: Payer: Self-pay | Admitting: *Deleted

## 2019-12-20 DIAGNOSIS — F17210 Nicotine dependence, cigarettes, uncomplicated: Secondary | ICD-10-CM

## 2019-12-20 DIAGNOSIS — Z87891 Personal history of nicotine dependence: Secondary | ICD-10-CM

## 2019-12-20 NOTE — Progress Notes (Signed)

## 2019-12-25 ENCOUNTER — Ambulatory Visit (INDEPENDENT_AMBULATORY_CARE_PROVIDER_SITE_OTHER): Payer: Medicare Other | Admitting: Gastroenterology

## 2019-12-25 ENCOUNTER — Telehealth: Payer: Self-pay | Admitting: Acute Care

## 2019-12-25 ENCOUNTER — Encounter: Payer: Self-pay | Admitting: Gastroenterology

## 2019-12-25 DIAGNOSIS — R12 Heartburn: Secondary | ICD-10-CM | POA: Diagnosis not present

## 2019-12-25 DIAGNOSIS — R1310 Dysphagia, unspecified: Secondary | ICD-10-CM | POA: Diagnosis not present

## 2019-12-25 NOTE — Progress Notes (Signed)
Review of pertinent gastrointestinal problems: 1.  Routine risk for colon cancer.  Colonoscopy 06/2012 for rectal bleeding showed no polyps or cancers.  She did have medium to large external anal hemorrhoids which were likely causing her rectal bleeding.  HPI: This is a very pleasant 69 year old woman who was referred to me by Etta Grandchild, MD  to evaluate GERD, dysphagia.    She has heartburn and dysphagia.  I think her heartburn has been going on for quite a long time.  Her dysphagia is interesting in that it is liquids much greater than solids.  The dysphagia has been a problem for 2 to 3 months.  She also belches a lot and thinks she has indigestion.  Her symptoms are also associate with some weight gain recently.  She has not been on antibiotics in the past several months.  She does recall having an upper endoscopy 20 or 30 years ago at which time her esophagus was dilated.  She has been on proton pump inhibitor omeprazole before breakfast once daily for about 6 months.  Old Data Reviewed:  Blood work March 2021 normal CBC and normal comprehensive metabolic profile    Review of systems: Pertinent positive and negative review of systems were noted in the above HPI section. All other review negative.   Past Medical History:  Diagnosis Date  . ALLERGIC RHINITIS 09/01/2007  . Anxiety and depression 12/24/2010  . Cardiomegaly 01/17/2009  . CONSTIPATION, CHRONIC 12/18/2008  . COPD 04/04/2007  . ELECTROCARDIOGRAM, ABNORMAL 01/17/2009  . GERD 04/04/2007  . HEADACHE, TENSION 04/18/2008  . HEMORRHOIDS 09/25/2007  . HYPERTENSION 06/12/2007  . SINUSITIS- ACUTE-NOS 05/08/2010  . TOBACCO ABUSE 04/04/2007  . UTI 09/30/2010  . Vaginitis and vulvovaginitis, unspecified 09/30/2010    Past Surgical History:  Procedure Laterality Date  . AMPUTATION Right 09/23/2017   Procedure: REVISION AMPUTATION RIGHT INDEX FINGER;  Surgeon: Betha Loa, MD;  Location: [facility];  Service:  Orthopedics;  Laterality: Right;  . BREAST BIOPSY Left   . TONSILLECTOMY    . TUBAL LIGATION    . UMBILICAL HERNIA REPAIR      Current Outpatient Medications  Medication Sig Dispense Refill  . albuterol (VENTOLIN HFA) 108 (90 Base) MCG/ACT inhaler Inhale 2 puffs into the lungs every 6 (six) hours as needed for wheezing or shortness of breath. 6.7 g 1  . Alum Hydroxide-Mag Carbonate (GAVISCON EXTRA STRENGTH) 508-475 MG/10ML SUSP Take 5 mLs by mouth 3 (three) times daily as needed. 355 mL 0  . Fluticasone-Umeclidin-Vilant (TRELEGY ELLIPTA) 100-62.5-25 MCG/INH AEPB Inhale 1 puff into the lungs daily. 90 each 1  . omeprazole (PRILOSEC) 40 MG capsule Take 1 capsule (40 mg total) by mouth daily. 90 capsule 1  . rosuvastatin (CRESTOR) 5 MG tablet Take 1 tablet (5 mg total) by mouth daily. 90 tablet 1  . triamterene-hydrochlorothiazide (DYAZIDE) 37.5-25 MG capsule Take 1 each (1 capsule total) by mouth daily. 90 capsule 1   No current facility-administered medications for this visit.    Allergies as of 12/25/2019  . (No Known Allergies)    Family History  Problem Relation Age of Onset  . Asthma Mother   . Hypertension Mother   . COPD Maternal Uncle   . Hypertension Father   . Seizures Father   . Stroke Paternal Uncle     Social History   Socioeconomic History  . Marital status: Divorced    Spouse name: Not on file  . Number of children: 1  . Years  of education: Not on file  . Highest education level: Not on file  Occupational History  . Occupation: custodial    Employer: [employer]  Tobacco Use  . Smoking status: Current Every Day Smoker    Packs/day: 1.00    Years: 35.00    Pack years: 35.00    Types: Cigarettes  . Smokeless tobacco: Never Used  Substance and Sexual Activity  . Alcohol use: Yes    Alcohol/week: 28.0 standard drinks    Types: 28 Cans of beer per week    Comment: 4 beer per day  . Drug use: No  . Sexual activity: Not Currently    Partners:  Male  Other Topics Concern  . Not on file  Social History Narrative  . Not on file   Social Determinants of Health   Financial Resource Strain:   . Difficulty of Paying Living Expenses:   Food Insecurity:   . Worried About Charity fundraiser in the Last Year:   . Arboriculturist in the Last Year:   Transportation Needs:   . Film/video editor (Medical):   Marland Kitchen Lack of Transportation (Non-Medical):   Physical Activity:   . Days of Exercise per Week:   . Minutes of Exercise per Session:   Stress:   . Feeling of Stress :   Social Connections:   . Frequency of Communication with Friends and Family:   . Frequency of Social Gatherings with Friends and Family:   . Attends Religious Services:   . Active Member of Clubs or Organizations:   . Attends Archivist Meetings:   Marland Kitchen Marital Status:   Intimate Partner Violence:   . Fear of Current or Ex-Partner:   . Emotionally Abused:   Marland Kitchen Physically Abused:   . Sexually Abused:      Physical Exam: BP 128/60 (BP Location: Left Arm, Patient Position: Sitting, Cuff Size: Normal)   Pulse 68   Ht 4' 10.5" (1.486 m) Comment: height measured without shoes  Wt 91 lb 6 oz (41.4 kg)   BMI 18.77 kg/m  Constitutional: Thin, healthy-appearing however Psychiatric: alert and oriented x3 Eyes: extraocular movements intact Mouth: oral pharynx moist, no lesions Neck: supple no lymphadenopathy Cardiovascular: heart regular rate and rhythm Lungs: clear to auscultation bilaterally Abdomen: soft, nontender, nondistended, no obvious ascites, no peritoneal signs, normal bowel sounds Extremities: no lower extremity edema bilaterally Skin: no lesions on visible extremities   Assessment and plan: 69 y.o. female with GERD, liquid dysphagia, history of esophageal stricture  I think probably a lot of her issues are related to acid.  She is going to continue taking her proton pump inhibitor shortly before her breakfast meal on a daily basis.   She has liquid predominant dysphagia which is unusual.  This is associate with weight gain and so my suspicion for underlying neoplasm is a bit low.  I recommended upper endoscopy at her soonest convenience and I will plan for dilation if indicated.  She does recall 20 or 30 years ago having an upper endoscopy and dilation.  She does not remember many of the details around that though.    Please see the "Patient Instructions" section for addition details about the plan.   Owens Loffler, MD [facility] 12/25/2019, 10:10 AM  Cc: Janith Lima, MD  Total time on date of encounter was 45  minutes (this included time spent preparing to see the patient reviewing records; obtaining and/or reviewing separately obtained history;  performing a medically appropriate exam and/or evaluation; counseling and educating the patient and family if present; ordering medications, tests or procedures if applicable; and documenting clinical information in the health record).

## 2019-12-25 NOTE — Telephone Encounter (Signed)
Spoke with patient. She stated that she was returning a call from yesterday. She did state that she received her LCS results yesterday from [place]. I advised her that I did not see where anyone else had called. She verbalized understanding.   Nothing further needed at time of call.

## 2019-12-25 NOTE — Patient Instructions (Signed)
If you are age 67 or older, your body mass index should be between 23-30. Your Body mass index is 18.77 kg/m. If this is out of the aforementioned range listed, please consider follow up with your Primary Care Provider.  If you are age 67 or younger, your body mass index should be between 19-25. Your Body mass index is 18.77 kg/m. If this is out of the aformentioned range listed, please consider follow up with your Primary Care Provider.   You have been scheduled for an endoscopy. Please follow written instructions given to you at your visit today. If you use inhalers (even only as needed), please bring them with you on the day of your procedure.  Due to recent changes in healthcare laws, you may see the results of your imaging and laboratory studies on MyChart before your provider has had a chance to review them.  We understand that in some cases there may be results that are confusing or concerning to you. Not all laboratory results come back in the same time frame and the provider may be waiting for multiple results in order to interpret others.  Please give [place] 48 hours in order for your provider to thoroughly review all the results before contacting the office for clarification of your results.   Thank you for entrusting me with your care and choosing [facility].  Dr Christella Hartigan

## 2020-01-08 ENCOUNTER — Encounter: Payer: Self-pay | Admitting: Gastroenterology

## 2020-01-08 ENCOUNTER — Ambulatory Visit (AMBULATORY_SURGERY_CENTER): Payer: Medicare Other | Admitting: Gastroenterology

## 2020-01-08 ENCOUNTER — Other Ambulatory Visit: Payer: Self-pay

## 2020-01-08 VITALS — BP 151/68 | HR 51 | Resp 14 | Ht <= 58 in | Wt 91.0 lb

## 2020-01-08 DIAGNOSIS — R12 Heartburn: Secondary | ICD-10-CM

## 2020-01-08 DIAGNOSIS — K2970 Gastritis, unspecified, without bleeding: Secondary | ICD-10-CM

## 2020-01-08 DIAGNOSIS — K2950 Unspecified chronic gastritis without bleeding: Secondary | ICD-10-CM | POA: Diagnosis not present

## 2020-01-08 DIAGNOSIS — R1310 Dysphagia, unspecified: Secondary | ICD-10-CM

## 2020-01-08 MED ORDER — OMEPRAZOLE 40 MG PO CPDR
40.0000 mg | DELAYED_RELEASE_CAPSULE | Freq: Two times a day (BID) | ORAL | 11 refills | Status: DC
Start: 2020-01-08 — End: 2021-01-16

## 2020-01-08 MED ORDER — SODIUM CHLORIDE 0.9 % IV SOLN: 500.0000 mL | Freq: Once | INTRAVENOUS | Status: DC

## 2020-01-08 NOTE — Patient Instructions (Signed)
HANDOUTS PROVIDED ON: GASTRITIS  The biopsies taken today have been sent for pathology.  The results can take 1-3 weeks to receive.    You may resume your previous diet and medication schedule.    A prescription has been sent to your pharmacy for Omeprazole which you will now take a 40 mg capsule twice daily (before breakfast and dinner).  Thank you for allowing [place] to care for you today!!!   YOU HAD AN ENDOSCOPIC PROCEDURE TODAY AT THE [facility]:   Refer to the procedure report that was given to you for any specific questions about what was found during the examination.  If the procedure report does not answer your questions, please call your gastroenterologist to clarify.  If you requested that your care partner not be given the details of your procedure findings, then the procedure report has been included in a sealed envelope for you to review at your convenience later.  YOU SHOULD EXPECT: Some feelings of bloating in the abdomen. Passage of more gas than usual.  Walking can help get rid of the air that was put into your GI tract during the procedure and reduce the bloating.   Please Note:  You might notice some irritation and congestion in your nose or some drainage.  This is from the oxygen used during your procedure.  There is no need for concern and it should clear up in a day or so.  SYMPTOMS TO REPORT IMMEDIATELY:   Following upper endoscopy (EGD)  Vomiting of blood or coffee ground material  New chest pain or pain under the shoulder blades  Painful or persistently difficult swallowing  New shortness of breath  Fever of 100F or higher  Black, tarry-looking stools  For urgent or emergent issues, a gastroenterologist can be reached at any hour by calling [phone]. Do not use MyChart messaging for urgent concerns.    DIET:  We do recommend a small meal at first, but then you may proceed to your regular diet.  Drink plenty of fluids but you should avoid  alcoholic beverages for 24 hours.  ACTIVITY:  You should plan to take it easy for the rest of today and you should NOT DRIVE or use heavy machinery until tomorrow (because of the sedation medicines used during the test).    FOLLOW UP: Our staff will call the number listed on your records 48-72 hours following your procedure to check on you and address any questions or concerns that you may have regarding the information given to you following your procedure. If we do not reach you, we will leave a message.  We will attempt to reach you two times.  During this call, we will ask if you have developed any symptoms of COVID 19. If you develop any symptoms (ie: fever, flu-like symptoms, shortness of breath, cough etc.) before then, please call [phone].  If you test positive for Covid 19 in the 2 weeks post procedure, please call and report this information to [place].    If any biopsies were taken you will be contacted by phone or by letter within the next 1-3 weeks.  Please call us at [phone] if you have not heard about the biopsies in 3 weeks.    SIGNATURES/CONFIDENTIALITY: You and/or your care partner have signed paperwork which will be entered into your electronic medical record.  These signatures attest to the fact that that the information above on your After Visit Summary has been reviewed and is understood.  Full responsibility of the confidentiality of this discharge information lies with you and/or your care-partner.

## 2020-01-08 NOTE — Progress Notes (Signed)
Called to room to assist during endoscopic procedure.  Patient ID and intended procedure confirmed with present staff. Received instructions for my participation in the procedure from the performing physician.  

## 2020-01-08 NOTE — Op Note (Signed)
[facility] Patient Name: Amber Griffith Procedure Date: 01/08/2020 9:40 AM MRN: [id number] Endoscopist: Rachael Fee , MD Age: 69 Referring MD:  Date of Birth: 1951/02/15 Gender: Female Account #: [id number] Procedure:                Upper GI endoscopy Indications:              Dysphagia, Heartburn Medicines:                Monitored Anesthesia Care Procedure:                Pre-Anesthesia Assessment:                           - Prior to the procedure, a History and Physical                            was performed, and patient medications and                            allergies were reviewed. The patient's tolerance of                            previous anesthesia was also reviewed. The risks                            and benefits of the procedure and the sedation                            options and risks were discussed with the patient.                            All questions were answered, and informed consent                            was obtained. Prior Anticoagulants: The patient has                            taken no previous anticoagulant or antiplatelet                            agents. ASA Grade Assessment: III - A patient with                            severe systemic disease. After reviewing the risks                            and benefits, the patient was deemed in                            satisfactory condition to undergo the procedure.                           After obtaining informed consent, the endoscope was  passed under direct vision. Throughout the                            procedure, the patient's blood pressure, pulse, and                            oxygen saturations were monitored continuously. The                            Endoscope was introduced through the mouth, and                            advanced to the second part of duodenum. The upper                            GI endoscopy was accomplished  without difficulty.                            The patient tolerated the procedure well. Scope In: Scope Out: Findings:                 The esophagus and GE junction were normal. Biopsies                            taken from distal (jar 2) and proximal (jar 3)                            esophagus to check for EoE.                           Mild inflammation characterized by friability and                            granularity was found in the entire examined                            stomach. Biopsies were taken with a cold forceps                            for histology. jar 1                           The exam was otherwise without abnormality. Complications:            No immediate complications. Estimated blood loss:                            None. Estimated Blood Loss:     Estimated blood loss: none. Impression:               - Mild gastritis, biopsied to check for H. pylori.                           - The esophagus and GE junction were normal.  Biopsies taken from distal (jar 2) and proximal                            (jar 3) esophagus to check for EoE.                           - The examination was otherwise normal. Recommendation:           - Patient has a contact number available for                            emergencies. The signs and symptoms of potential                            delayed complications were discussed with the                            patient. Return to normal activities tomorrow.                            Written discharge instructions were provided to the                            patient.                           - Resume previous diet.                           - Continue present medications. New prescription                            called in today for increased omeprazole dosing                            (omeprazole 40mg  pills, one pill twice daily before                            meals, disp 60 with 11  refills.                           - Await pathology results. Milus Banister, MD 01/08/2020 10:09:17 AM This report has been signed electronically.

## 2020-01-08 NOTE — Progress Notes (Signed)
Report given to PACU, vss 

## 2020-01-08 NOTE — Progress Notes (Signed)
Pt's states no medical or surgical changes since previsit or office visit. 

## 2020-01-10 ENCOUNTER — Telehealth: Payer: Self-pay

## 2020-01-10 NOTE — Telephone Encounter (Signed)
°  Follow up Call-  Called #[phone] and left a messaged we tried to reach pt for a follow up call. maw

## 2020-01-10 NOTE — Telephone Encounter (Signed)
  Follow up Call-  Call back number 01/08/2020  Post procedure Call Back phone  # [phone]  Permission to leave phone message Yes  Some recent data might be hidden     Patient questions:  Do you have a fever, pain , or abdominal swelling? No. Pain Score  0 *  Have you tolerated food without any problems? Yes.    Have you been able to return to your normal activities? Yes.    Do you have any questions about your discharge instructions: Diet   No. Medications  No. Follow up visit  No.  Do you have questions or concerns about your Care? No.  Actions: * If pain score is 4 or above: No action needed, pain <4.  1. Have you developed a fever since your procedure? No  2.   Have you had an respiratory symptoms (SOB or cough) since your procedure? No  3.   Have you tested positive for COVID 19 since your procedure No  4.   Have you had any family members/close contacts diagnosed with the COVID 19 since your procedure?  No   If yes to any of these questions please route to Laverna Peace, RN and Charlett Lango, RN

## 2020-01-10 NOTE — Telephone Encounter (Signed)
Patient returned the call states she is doing well no questions at this time. 

## 2020-01-14 ENCOUNTER — Encounter: Payer: Self-pay | Admitting: Gastroenterology

## 2020-04-19 ENCOUNTER — Other Ambulatory Visit: Payer: Self-pay | Admitting: Internal Medicine

## 2020-04-19 DIAGNOSIS — I10 Essential (primary) hypertension: Secondary | ICD-10-CM

## 2020-04-19 DIAGNOSIS — E7800 Pure hypercholesterolemia, unspecified: Secondary | ICD-10-CM

## 2020-06-16 ENCOUNTER — Other Ambulatory Visit: Payer: Self-pay

## 2020-06-16 ENCOUNTER — Encounter: Payer: Self-pay | Admitting: Internal Medicine

## 2020-06-16 ENCOUNTER — Ambulatory Visit (INDEPENDENT_AMBULATORY_CARE_PROVIDER_SITE_OTHER): Payer: Medicare Other | Admitting: Internal Medicine

## 2020-06-16 VITALS — BP 144/70 | HR 68 | Temp 98.8°F | Resp 16 | Ht <= 58 in | Wt 94.4 lb

## 2020-06-16 DIAGNOSIS — Z23 Encounter for immunization: Secondary | ICD-10-CM

## 2020-06-16 DIAGNOSIS — A084 Viral intestinal infection, unspecified: Secondary | ICD-10-CM | POA: Insufficient documentation

## 2020-06-16 DIAGNOSIS — I10 Essential (primary) hypertension: Secondary | ICD-10-CM

## 2020-06-16 LAB — CBC WITH DIFFERENTIAL/PLATELET
Basophils Absolute: 0.1 10*3/uL (ref 0.0–0.1)
Basophils Relative: 1.1 % (ref 0.0–3.0)
Eosinophils Absolute: 0 10*3/uL (ref 0.0–0.7)
Eosinophils Relative: 0.2 % (ref 0.0–5.0)
HCT: 39.7 % (ref 36.0–46.0)
Hemoglobin: 13 g/dL (ref 12.0–15.0)
Lymphocytes Relative: 24.6 % (ref 12.0–46.0)
Lymphs Abs: 2.4 10*3/uL (ref 0.7–4.0)
MCHC: 32.8 g/dL (ref 30.0–36.0)
MCV: 90.3 fl (ref 78.0–100.0)
Monocytes Absolute: 0.9 10*3/uL (ref 0.1–1.0)
Monocytes Relative: 9.6 % (ref 3.0–12.0)
Neutro Abs: 6.3 10*3/uL (ref 1.4–7.7)
Neutrophils Relative %: 64.5 % (ref 43.0–77.0)
Platelets: 249 10*3/uL (ref 150.0–400.0)
RBC: 4.4 Mil/uL (ref 3.87–5.11)
RDW: 12.7 % (ref 11.5–15.5)
WBC: 9.7 10*3/uL (ref 4.0–10.5)

## 2020-06-16 LAB — BASIC METABOLIC PANEL
BUN: 18 mg/dL (ref 6–23)
CO2: 27 mEq/L (ref 19–32)
Calcium: 9.3 mg/dL (ref 8.4–10.5)
Chloride: 102 mEq/L (ref 96–112)
Creatinine, Ser: 0.81 mg/dL (ref 0.40–1.20)
GFR: 74.88 mL/min (ref >60.00)
Glucose, Bld: 75 mg/dL (ref 70–99)
Potassium: 3.6 mEq/L (ref 3.5–5.1)
Sodium: 140 mEq/L (ref 135–145)

## 2020-06-16 MED ORDER — DIPHENOXYLATE-ATROPINE 2.5-0.025 MG PO TABS: 1.0000 | ORAL_TABLET | Freq: Four times a day (QID) | ORAL | 0 refills | Status: DC | PRN

## 2020-06-16 NOTE — Progress Notes (Signed)
Subjective:  Patient ID: Amber Griffith, female    DOB: 02-07-1951  Age: 69 y.o. MRN: [id number]  CC: Diarrhea and Hypertension  This visit occurred during the SARS-CoV-2 public health emergency.  Safety protocols were in place, including screening questions prior to the visit, additional usage of staff PPE, and extensive cleaning of exam room while observing appropriate contact time as indicated for disinfecting solutions.    HPI Amber Griffith presents for f/up -  She complains of a 2-day history of mild, watery diarrhea with chills and nausea.  She denies abdominal pain, vomiting, fever, loss of appetite, bloody stools, or weight loss.  She tells me her symptoms are improving.  Outpatient Medications Prior to Visit  Medication Sig Dispense Refill  . albuterol (VENTOLIN HFA) 108 (90 Base) MCG/ACT inhaler Inhale 2 puffs into the lungs every 6 (six) hours as needed for wheezing or shortness of breath. 6.7 g 1  . Alum Hydroxide-Mag Carbonate (GAVISCON EXTRA STRENGTH) 508-475 MG/10ML SUSP Take 5 mLs by mouth 3 (three) times daily as needed. 355 mL 0  . Fluticasone-Umeclidin-Vilant (TRELEGY ELLIPTA) 100-62.5-25 MCG/INH AEPB Inhale 1 puff into the lungs daily. 90 each 1  . omeprazole (PRILOSEC) 40 MG capsule Take 1 capsule (40 mg total) by mouth 2 (two) times daily before a meal. 60 capsule 11  . rosuvastatin (CRESTOR) 5 MG tablet TAKE 1 TABLET BY MOUTH EVERY DAY 90 tablet 0  . triamterene-hydrochlorothiazide (DYAZIDE) 37.5-25 MG capsule TAKE 1 CAPSULE BY MOUTH EVERY DAY 90 capsule 0   No facility-administered medications prior to visit.    ROS Review of Systems  Constitutional: Positive for chills. Negative for appetite change, fatigue and fever.  HENT: Negative.   Eyes: Negative.   Respiratory: Negative for cough, chest tightness, shortness of breath and wheezing.   Cardiovascular: Negative for chest pain, palpitations and leg swelling.  Gastrointestinal: Positive for  diarrhea and nausea. Negative for abdominal pain, blood in stool and vomiting.  Endocrine: Negative.   Genitourinary: Negative.  Negative for difficulty urinating and dysuria.  Musculoskeletal: Negative for arthralgias and myalgias.  Skin: Negative.  Negative for color change.  Neurological: Negative.  Negative for dizziness, weakness and light-headedness.  Hematological: Negative for adenopathy. Does not bruise/bleed easily.  Psychiatric/Behavioral: Negative.     Objective:  BP (!) 144/70 (BP Location: Left Arm, Patient Position: Sitting, Cuff Size: Normal)   Pulse 68   Temp 98.8 F (37.1 C) (Oral)   Resp 16   Ht 4\' 10"  (1.473 m)   Wt 94 lb 6.4 oz (42.8 kg)   SpO2 97%   BMI 19.73 kg/m   BP Readings from Last 3 Encounters:  06/16/20 (!) 144/70  01/08/20 (!) 151/68  12/25/19 128/60    Wt Readings from Last 3 Encounters:  06/16/20 94 lb 6.4 oz (42.8 kg)  01/08/20 91 lb (41.3 kg)  12/25/19 91 lb 6 oz (41.4 kg)    Physical Exam  Lab Results  Component Value Date   WBC 9.7 06/16/2020   HGB 13.0 06/16/2020   HCT 39.7 06/16/2020   PLT 249.0 06/16/2020   GLUCOSE 75 06/16/2020   CHOL 169 10/22/2019   TRIG 116.0 10/22/2019   HDL 60.40 10/22/2019   LDLDIRECT 84.0 01/25/2018   LDLCALC 86 10/22/2019   ALT 12 10/22/2019   AST 14 10/22/2019   NA 140 06/16/2020   K 3.6 06/16/2020   CL 102 06/16/2020   CREATININE 0.81 06/16/2020   BUN 18 06/16/2020   CO2 27 06/16/2020  TSH 2.37 10/22/2019   HGBA1C 4.9 07/30/2013    CT CHEST LUNG CA SCREEN LOW DOSE W/O CM  Result Date: 12/18/2019 CLINICAL DATA:  Forty-nine pack-year smoking history, current smoker. EXAM: CT CHEST WITHOUT CONTRAST LOW-DOSE FOR LUNG CANCER SCREENING TECHNIQUE: Multidetector CT imaging of the chest was performed following the standard protocol without IV contrast. COMPARISON:  [id number] chest radiograph. Diagnostic chest CT 06/17/2017. FINDINGS: Cardiovascular: Aortic and branch vessel atherosclerosis. Tortuous  thoracic aorta. Mild cardiomegaly, without pericardial effusion. Lad and right coronary artery atherosclerosis. Mediastinum/Nodes: No mediastinal or definite hilar adenopathy, given limitations of unenhanced CT. Lungs/Pleura: No pleural fluid. Moderate centrilobular and paraseptal emphysema. Bilateral pulmonary nodules of maximally volume derived equivalent diameter 4.5 mm within the right middle lobe. Upper Abdomen: Normal imaged portions of the liver, spleen, stomach, pancreas, adrenal glands, kidneys. Musculoskeletal: No acute osseous abnormality. IMPRESSION: 1. Lung-RADS 2, benign appearance or behavior. Continue annual screening with low-dose chest CT without contrast in 12 months. 2. Aortic atherosclerosis (ICD10-I70.0), coronary artery atherosclerosis and emphysema (ICD10-J43.9). Electronically Signed   By: Jeronimo Greaves M.D.   On: 12/18/2019 08:39    Assessment & Plan:   Havannah was seen today for diarrhea and hypertension.  Diagnoses and all orders for this visit:  Essential hypertension- Her blood pressure is not adequately well controlled.  Labs are negative for secondary causes or endorgan damage.  I recommended that she add nebivolol to the diuretics. -     CBC with Differential/Platelet; Future -     Basic metabolic panel; Future -     Basic metabolic panel -     CBC with Differential/Platelet -     nebivolol (BYSTOLIC) 5 MG tablet; Take 1 tablet (5 mg total) by mouth daily.  Gastroenteritis and colitis, viral- Based on her symptoms and exam she has benign viral gastroenteritis.  She will use Lomotil as needed for the diarrhea. -     diphenoxylate-atropine (LOMOTIL) 2.5-0.025 MG tablet; Take 1 tablet by mouth 4 (four) times daily as needed for diarrhea or loose stools.  Flu vaccine need -     Flu Vaccine QUAD High Dose(Fluad)  Need for vaccination against Streptococcus pneumoniae -     Pneumococcal polysaccharide vaccine 23-valent greater than or equal to 2yo  subcutaneous/IM   I am having Semaya L. Veillon start on diphenoxylate-atropine and nebivolol. I am also having her maintain her Trelegy Ellipta, albuterol, Gaviscon Extra Strength, omeprazole, triamterene-hydrochlorothiazide, and rosuvastatin.  Meds ordered this encounter  Medications  . diphenoxylate-atropine (LOMOTIL) 2.5-0.025 MG tablet    Sig: Take 1 tablet by mouth 4 (four) times daily as needed for diarrhea or loose stools.    Dispense:  30 tablet    Refill:  0  . nebivolol (BYSTOLIC) 5 MG tablet    Sig: Take 1 tablet (5 mg total) by mouth daily.    Dispense:  90 tablet    Refill:  1     Follow-up: Return in about 1 week (around 06/23/2020).  Sanda Linger, MD

## 2020-06-16 NOTE — Patient Instructions (Signed)
Viral Gastroenteritis, Adult  Viral gastroenteritis is also known as the stomach flu. This condition may affect your stomach, small intestine, and large intestine. It can cause sudden watery diarrhea, fever, and vomiting. This condition is caused by many different viruses. These viruses can be passed from person to person very easily (are contagious). Diarrhea and vomiting can make you feel weak and cause you to become dehydrated. You may not be able to keep fluids down. Dehydration can make you tired and thirsty, cause you to have a dry mouth, and decrease how often you urinate. It is important to replace the fluids that you lose from diarrhea and vomiting. What are the causes? Gastroenteritis is caused by many viruses, including rotavirus and norovirus. Norovirus is the most common cause in adults. You can get sick after being exposed to the viruses from other people. You can also get sick by:  Eating food, drinking water, or touching a surface contaminated with one of these viruses.  Sharing utensils or other personal items with an infected person. What increases the risk? You are more likely to develop this condition if you:  Have a weak body defense system (immune system).  Live with one or more children who are younger than 2 years old.  Live in a nursing home.  Travel on cruise ships. What are the signs or symptoms? Symptoms of this condition start suddenly 1-3 days after exposure to a virus. Symptoms may last for a few days or for as long as a week. Common symptoms include watery diarrhea and vomiting. Other symptoms include:  Fever.  Headache.  Fatigue.  Pain in the abdomen.  Chills.  Weakness.  Nausea.  Muscle aches.  Loss of appetite. How is this diagnosed? This condition is diagnosed with a medical history and physical exam. You may also have a stool test to check for viruses or other infections. How is this treated? This condition typically goes away on its  own. The focus of treatment is to prevent dehydration and restore lost fluids (rehydration). This condition may be treated with:  An oral rehydration solution (ORS) to replace important salts and minerals (electrolytes) in your body. Take this if told by your health care provider. This is a drink that is sold at pharmacies and retail stores.  Medicines to help with your symptoms.  Probiotic supplements to reduce symptoms of diarrhea.  Fluids given through an IV, if dehydration is severe. Older adults and people with other diseases or a weak immune system are at higher risk for dehydration. Follow these instructions at home:  Eating and drinking   Take an ORS as told by your health care provider.  Drink clear fluids in small amounts as you are able. Clear fluids include: ? Water. ? Ice chips. ? Diluted fruit juice. ? Low-calorie sports drinks.  Drink enough fluid to keep your urine pale yellow.  Eat small amounts of healthy foods every 3-4 hours as you are able. This may include whole grains, fruits, vegetables, lean meats, and yogurt.  Avoid fluids that contain a lot of sugar or caffeine, such as energy drinks, sports drinks, and soda.  Avoid spicy or fatty foods.  Avoid alcohol. General instructions  Wash your hands often, especially after having diarrhea or vomiting. If soap and water are not available, use hand sanitizer.  Make sure that all people in your household wash their hands well and often.  Take over-the-counter and prescription medicines only as told by your health care provider.  Rest at   home while you recover.  Watch your condition for any changes.  Take a warm bath to relieve any burning or pain from frequent diarrhea episodes.  Keep all follow-up visits as told by your health care provider. This is important. Contact a health care provider if you:  Cannot keep fluids down.  Have symptoms that get worse.  Have new symptoms.  Feel light-headed or  dizzy.  Have muscle cramps. Get help right away if you:  Have chest pain.  Feel extremely weak or you faint.  See blood in your vomit.  Have vomit that looks like coffee grounds.  Have bloody or black stools or stools that look like tar.  Have a severe headache, a stiff neck, or both.  Have a rash.  Have severe pain, cramping, or bloating in your abdomen.  Have trouble breathing or you are breathing very quickly.  Have a fast heartbeat.  Have skin that feels cold and clammy.  Feel confused.  Have pain when you urinate.  Have signs of dehydration, such as: ? Dark urine, very little urine, or no urine. ? Cracked lips. ? Dry mouth. ? Sunken eyes. ? Sleepiness. ? Weakness. Summary  Viral gastroenteritis is also known as the stomach flu. It can cause sudden watery diarrhea, fever, and vomiting.  This condition can be passed from person to person very easily (is contagious).  Take an ORS if told by your health care provider. This is a drink that is sold at pharmacies and retail stores.  Wash your hands often, especially after having diarrhea or vomiting. If soap and water are not available, use hand sanitizer. This information is not intended to replace advice given to you by your health care provider. Make sure you discuss any questions you have with your health care provider. Document Revised: 01/05/2019 Document Reviewed: 05/24/2018 [employer]vier Patient Education  2020 [employer]  

## 2020-06-20 MED ORDER — NEBIVOLOL HCL 5 MG PO TABS: 5.0000 mg | ORAL_TABLET | Freq: Every day | ORAL | 1 refills | Status: DC

## 2020-06-23 ENCOUNTER — Other Ambulatory Visit: Payer: Self-pay

## 2020-06-23 ENCOUNTER — Ambulatory Visit (INDEPENDENT_AMBULATORY_CARE_PROVIDER_SITE_OTHER): Payer: Medicare Other | Admitting: Internal Medicine

## 2020-06-23 ENCOUNTER — Encounter: Payer: Self-pay | Admitting: Internal Medicine

## 2020-06-23 VITALS — BP 138/74 | HR 68 | Temp 98.2°F | Resp 16 | Ht <= 58 in | Wt 94.0 lb

## 2020-06-23 DIAGNOSIS — I10 Essential (primary) hypertension: Secondary | ICD-10-CM | POA: Diagnosis not present

## 2020-06-23 NOTE — Patient Instructions (Signed)

## 2020-06-23 NOTE — Progress Notes (Addendum)
Subjective:  Patient ID: Amber Griffith, female    DOB: 1951-05-02  Age: 69 y.o. MRN: [id number]  CC: Hypertension  This visit occurred during the SARS-CoV-2 public health emergency.  Safety protocols were in place, including screening questions prior to the visit, additional usage of staff PPE, and extensive cleaning of exam room while observing appropriate contact time as indicated for disinfecting solutions.    HPI Amber Griffith presents for a BP check - She tells me the diarrhea has resolved.  She is taking both antihypertensives and says her blood pressure is well controlled.  She denies headache, blurred vision, dizziness, lightheadedness, chest pain, or shortness of breath.  Outpatient Medications Prior to Visit  Medication Sig Dispense Refill  . albuterol (VENTOLIN HFA) 108 (90 Base) MCG/ACT inhaler Inhale 2 puffs into the lungs every 6 (six) hours as needed for wheezing or shortness of breath. 6.7 g 1  . Alum Hydroxide-Mag Carbonate (GAVISCON EXTRA STRENGTH) 508-475 MG/10ML SUSP Take 5 mLs by mouth 3 (three) times daily as needed. 355 mL 0  . Fluticasone-Umeclidin-Vilant (TRELEGY ELLIPTA) 100-62.5-25 MCG/INH AEPB Inhale 1 puff into the lungs daily. 90 each 1  . nebivolol (BYSTOLIC) 5 MG tablet Take 1 tablet (5 mg total) by mouth daily. 90 tablet 1  . omeprazole (PRILOSEC) 40 MG capsule Take 1 capsule (40 mg total) by mouth 2 (two) times daily before a meal. 60 capsule 11  . diphenoxylate-atropine (LOMOTIL) 2.5-0.025 MG tablet Take 1 tablet by mouth 4 (four) times daily as needed for diarrhea or loose stools. 30 tablet 0  . rosuvastatin (CRESTOR) 5 MG tablet TAKE 1 TABLET BY MOUTH EVERY DAY 90 tablet 0  . triamterene-hydrochlorothiazide (DYAZIDE) 37.5-25 MG capsule TAKE 1 CAPSULE BY MOUTH EVERY DAY 90 capsule 0   No facility-administered medications prior to visit.    ROS Review of Systems  Constitutional: Negative for appetite change, diaphoresis, fatigue and  unexpected weight change.  HENT: Negative.   Eyes: Negative for visual disturbance.  Respiratory: Negative for cough, chest tightness, shortness of breath and wheezing.   Cardiovascular: Negative for chest pain, palpitations and leg swelling.  Gastrointestinal: Negative for abdominal pain, constipation, diarrhea, nausea and vomiting.  Endocrine: Negative.   Genitourinary: Negative.  Negative for difficulty urinating.  Musculoskeletal: Negative for arthralgias and myalgias.  Skin: Negative.  Negative for pallor.  Neurological: Negative.  Negative for dizziness and weakness.  Hematological: Negative for adenopathy. Does not bruise/bleed easily.  Psychiatric/Behavioral: Negative.     Objective:  BP 138/74   Pulse 68   Temp 98.2 F (36.8 C) (Oral)   Resp 16   Ht 4\' 10"  (1.473 m)   Wt 94 lb (42.6 kg)   SpO2 98%   BMI 19.65 kg/m   BP Readings from Last 3 Encounters:  06/23/20 138/74  06/16/20 (!) 144/70  01/08/20 (!) 151/68    Wt Readings from Last 3 Encounters:  06/23/20 94 lb (42.6 kg)  06/16/20 94 lb 6.4 oz (42.8 kg)  01/08/20 91 lb (41.3 kg)    Physical Exam Vitals reviewed.  HENT:     Nose: Nose normal.     Mouth/Throat:     Mouth: Mucous membranes are moist.  Eyes:     General: No scleral icterus.    Conjunctiva/sclera: Conjunctivae normal.  Cardiovascular:     Rate and Rhythm: Normal rate and regular rhythm.     Heart sounds: No murmur heard.   Pulmonary:     Effort: Pulmonary effort is normal.  Breath sounds: No stridor. No wheezing, rhonchi or rales.  Abdominal:     General: Abdomen is flat. Bowel sounds are normal. There is no distension.     Palpations: Abdomen is soft. There is no hepatomegaly, splenomegaly or mass.     Tenderness: There is no abdominal tenderness.  Musculoskeletal:        General: Normal range of motion.     Cervical back: Neck supple.     Right lower leg: No edema.     Left lower leg: No edema.  Skin:    General: Skin is  warm and dry.     Coloration: Skin is not pale.  Neurological:     General: No focal deficit present.     Mental Status: She is alert.  Psychiatric:        Mood and Affect: Mood normal.        Behavior: Behavior normal.     Lab Results  Component Value Date   WBC 9.7 06/16/2020   HGB 13.0 06/16/2020   HCT 39.7 06/16/2020   PLT 249.0 06/16/2020   GLUCOSE 75 06/16/2020   CHOL 169 10/22/2019   TRIG 116.0 10/22/2019   HDL 60.40 10/22/2019   LDLDIRECT 84.0 01/25/2018   LDLCALC 86 10/22/2019   ALT 12 10/22/2019   AST 14 10/22/2019   NA 140 06/16/2020   K 3.6 06/16/2020   CL 102 06/16/2020   CREATININE 0.81 06/16/2020   BUN 18 06/16/2020   CO2 27 06/16/2020   TSH 2.37 10/22/2019   HGBA1C 4.9 07/30/2013    CT CHEST LUNG CA SCREEN LOW DOSE W/O CM  Result Date: 12/18/2019 CLINICAL DATA:  Forty-nine pack-year smoking history, current smoker. EXAM: CT CHEST WITHOUT CONTRAST LOW-DOSE FOR LUNG CANCER SCREENING TECHNIQUE: Multidetector CT imaging of the chest was performed following the standard protocol without IV contrast. COMPARISON:  [id number] chest radiograph. Diagnostic chest CT 06/17/2017. FINDINGS: Cardiovascular: Aortic and branch vessel atherosclerosis. Tortuous thoracic aorta. Mild cardiomegaly, without pericardial effusion. Lad and right coronary artery atherosclerosis. Mediastinum/Nodes: No mediastinal or definite hilar adenopathy, given limitations of unenhanced CT. Lungs/Pleura: No pleural fluid. Moderate centrilobular and paraseptal emphysema. Bilateral pulmonary nodules of maximally volume derived equivalent diameter 4.5 mm within the right middle lobe. Upper Abdomen: Normal imaged portions of the liver, spleen, stomach, pancreas, adrenal glands, kidneys. Musculoskeletal: No acute osseous abnormality. IMPRESSION: 1. Lung-RADS 2, benign appearance or behavior. Continue annual screening with low-dose chest CT without contrast in 12 months. 2. Aortic atherosclerosis (ICD10-I70.0),  coronary artery atherosclerosis and emphysema (ICD10-J43.9). Electronically Signed   By: Amber Griffith M.D.   On: 12/18/2019 08:39    Assessment & Plan:   Amber Griffith was seen today for hypertension.  Diagnoses and all orders for this visit:  Essential hypertension- Her blood pressure is adequately well controlled.  Will continue the combination of nebivolol, triamterene, and hydrochlorothiazide.   I have discontinued Jocelyn L. Carns's diphenoxylate-atropine. I am also having her maintain her Trelegy Ellipta, albuterol, Gaviscon Extra Strength, omeprazole, and nebivolol.  No orders of the defined types were placed in this encounter.    Follow-up: Return in about 6 months (around 12/21/2020).  Sanda Linger, MD

## 2020-06-30 ENCOUNTER — Telehealth: Payer: Self-pay | Admitting: Internal Medicine

## 2020-06-30 NOTE — Telephone Encounter (Signed)
Called pt for clarification. The 2 meds she needs to know if she should take is the triamterene-htz and the nebivolol. Does she take both or one them? Please advise.

## 2020-06-30 NOTE — Telephone Encounter (Signed)
omeprazole (PRILOSEC) 40 MG capsule nebivolol (BYSTOLIC) 5 MG tablet  Patient was seen on 11.22.21 and was prescribed a new blood pressure medication and she wasn't sure if she needed to be taking both of these medications or just one of them and wanted to get some clarification. [phone]

## 2020-07-01 NOTE — Telephone Encounter (Signed)
Called pt, LVM stating to take both meds.

## 2020-07-04 ENCOUNTER — Other Ambulatory Visit: Payer: Self-pay | Admitting: Internal Medicine

## 2020-07-04 DIAGNOSIS — Z1231 Encounter for screening mammogram for malignant neoplasm of breast: Secondary | ICD-10-CM

## 2020-07-07 ENCOUNTER — Other Ambulatory Visit: Payer: Self-pay

## 2020-07-07 ENCOUNTER — Ambulatory Visit
Admission: RE | Admit: 2020-07-07 | Discharge: 2020-07-07 | Disposition: A | Discharge status: Home / Self Care | Payer: Medicare Other | Source: Ambulatory Visit | Attending: Internal Medicine | Admitting: Internal Medicine

## 2020-07-07 DIAGNOSIS — Z1231 Encounter for screening mammogram for malignant neoplasm of breast: Secondary | ICD-10-CM

## 2020-07-08 LAB — HM MAMMOGRAPHY

## 2020-07-16 ENCOUNTER — Other Ambulatory Visit: Payer: Self-pay | Admitting: Internal Medicine

## 2020-07-16 DIAGNOSIS — E7800 Pure hypercholesterolemia, unspecified: Secondary | ICD-10-CM

## 2020-07-16 DIAGNOSIS — I10 Essential (primary) hypertension: Secondary | ICD-10-CM

## 2020-08-31 ENCOUNTER — Other Ambulatory Visit: Payer: Self-pay | Admitting: Internal Medicine

## 2020-08-31 DIAGNOSIS — E7800 Pure hypercholesterolemia, unspecified: Secondary | ICD-10-CM

## 2020-08-31 DIAGNOSIS — K2100 Gastro-esophageal reflux disease with esophagitis, without bleeding: Secondary | ICD-10-CM

## 2020-10-16 ENCOUNTER — Other Ambulatory Visit: Payer: Self-pay | Admitting: Internal Medicine

## 2020-10-16 DIAGNOSIS — I10 Essential (primary) hypertension: Secondary | ICD-10-CM

## 2020-12-19 ENCOUNTER — Other Ambulatory Visit: Payer: Self-pay | Admitting: Internal Medicine

## 2020-12-19 DIAGNOSIS — I10 Essential (primary) hypertension: Secondary | ICD-10-CM

## 2021-01-16 ENCOUNTER — Other Ambulatory Visit: Payer: Self-pay | Admitting: Internal Medicine

## 2021-01-16 ENCOUNTER — Other Ambulatory Visit: Payer: Self-pay | Admitting: Gastroenterology

## 2021-01-16 DIAGNOSIS — I10 Essential (primary) hypertension: Secondary | ICD-10-CM

## 2021-01-17 ENCOUNTER — Emergency Department (HOSPITAL_COMMUNITY)
Admission: EM | Admit: 2021-01-17 | Discharge: 2021-01-17 | Disposition: A | Discharge status: Home / Self Care | Payer: Medicare Other | Attending: Emergency Medicine | Admitting: Emergency Medicine

## 2021-01-17 DIAGNOSIS — F17210 Nicotine dependence, cigarettes, uncomplicated: Secondary | ICD-10-CM | POA: Insufficient documentation

## 2021-01-17 DIAGNOSIS — Z79899 Other long term (current) drug therapy: Secondary | ICD-10-CM | POA: Insufficient documentation

## 2021-01-17 DIAGNOSIS — J449 Chronic obstructive pulmonary disease, unspecified: Secondary | ICD-10-CM | POA: Diagnosis not present

## 2021-01-17 DIAGNOSIS — S20462A Insect bite (nonvenomous) of left back wall of thorax, initial encounter: Secondary | ICD-10-CM | POA: Diagnosis not present

## 2021-01-17 DIAGNOSIS — Z7951 Long term (current) use of inhaled steroids: Secondary | ICD-10-CM | POA: Insufficient documentation

## 2021-01-17 DIAGNOSIS — I10 Essential (primary) hypertension: Secondary | ICD-10-CM | POA: Diagnosis not present

## 2021-01-17 DIAGNOSIS — W57XXXA Bitten or stung by nonvenomous insect and other nonvenomous arthropods, initial encounter: Secondary | ICD-10-CM | POA: Diagnosis not present

## 2021-01-17 MED ORDER — DOXYCYCLINE HYCLATE 100 MG PO CAPS: 100.0000 mg | ORAL_CAPSULE | Freq: Two times a day (BID) | ORAL | 0 refills | Status: DC

## 2021-01-17 NOTE — ED Triage Notes (Signed)
Pt said a tick was removed from her back yesterday. She says her pain and itchy remain.

## 2021-01-17 NOTE — Discharge Instructions (Addendum)
Take doxycycline as prescribed and complete the full course.  Recheck with your doctor for any concerns.

## 2021-01-17 NOTE — ED Provider Notes (Signed)
[facility] Provider Note   CSN: [id number] Arrival date & time: 01/17/21  1352     History Chief Complaint  Patient presents with   Insect Bite    Amber Griffith is a 70 y.o. female.  70 year old female presents with complaint of a tick bite to her back.  Patient states that she first noticed the area 4 days ago, had a family member remove the tick but states she cannot really visualize the area well, can tell that it is swollen and itchy.  Unknown how long the tick was on her.  No other complaints or concerns.      Past Medical History:  Diagnosis Date   ALLERGIC RHINITIS 09/01/2007   Anxiety and depression 12/24/2010   Cardiomegaly 01/17/2009   CONSTIPATION, CHRONIC 12/18/2008   COPD 04/04/2007   ELECTROCARDIOGRAM, ABNORMAL 01/17/2009   GERD 04/04/2007   HEADACHE, TENSION 04/18/2008   HEMORRHOIDS 09/25/2007   HYPERTENSION 06/12/2007   SINUSITIS- ACUTE-NOS 05/08/2010   TOBACCO ABUSE 04/04/2007   UTI 09/30/2010   Vaginitis and vulvovaginitis, unspecified 09/30/2010    Patient Active Problem List   Diagnosis Date Noted   Gastroenteritis and colitis, viral 06/16/2020   Esophageal dysphagia 09/25/2018   Vitamin D deficiency 08/31/2016   Cervical cancer screening 08/30/2016   Encounter for screening for lung cancer 08/30/2016   Chronic midline thoracic back pain 08/30/2016   Chronic low back pain without sciatica 06/23/2015   Compression fracture of lumbar spine, non-traumatic (HCC) 06/23/2015   Osteoporosis 06/23/2015   Atopic dermatitis 11/10/2012   Pure hypercholesterolemia 03/01/2012   Hemorrhoids without complication 03/01/2012   Anxiety and depression 12/24/2010   Insomnia 12/24/2010   Routine general medical examination at a health care facility 12/24/2010   Cardiomegaly 01/17/2009   CONSTIPATION, CHRONIC 12/18/2008   ALLERGIC RHINITIS 09/01/2007   Essential hypertension 06/12/2007   TOBACCO ABUSE 04/04/2007   COPD (chronic  obstructive pulmonary disease) with chronic bronchitis (HCC) 04/04/2007   GERD 04/04/2007    Past Surgical History:  Procedure Laterality Date   AMPUTATION Right 09/23/2017   Procedure: REVISION AMPUTATION RIGHT INDEX FINGER;  Surgeon: Betha Loa, MD;  Location: [facility];  Service: Orthopedics;  Laterality: Right;   BREAST BIOPSY Left    TONSILLECTOMY     TUBAL LIGATION     UMBILICAL HERNIA REPAIR       OB History     Gravida  1   Para      Term      Preterm      AB  1   Living  1      SAB      IAB      Ectopic  1   Multiple      Live Births              Family History  Problem Relation Age of Onset   Asthma Mother    Hypertension Mother    COPD Maternal Uncle    Hypertension Father    Seizures Father    Stroke Paternal Uncle    Colon cancer Neg Hx    Esophageal cancer Neg Hx    Stomach cancer Neg Hx    Rectal cancer Neg Hx     Social History   Tobacco Use   Smoking status: Every Day    Packs/day: 1.00    Years: 35.00    Pack years: 35.00    Types: Cigarettes   Smokeless tobacco: Never  Vaping  Use   Vaping Use: Never used  Substance Use Topics   Alcohol use: Yes    Alcohol/week: 28.0 standard drinks    Types: 28 Cans of beer per week    Comment: 4 beer per day   Drug use: No    Home Medications Prior to Admission medications   Medication Sig Start Date End Date Taking? Authorizing Provider  doxycycline (VIBRAMYCIN) 100 MG capsule Take 1 capsule (100 mg total) by mouth 2 (two) times daily. 01/17/21  Yes Jeannie Fend, PA-C  albuterol (VENTOLIN HFA) 108 (90 Base) MCG/ACT inhaler Inhale 2 puffs into the lungs every 6 (six) hours as needed for wheezing or shortness of breath. 11/12/19   Olive Bass, FNP  [facility] [phone] MG/5ML SUSP TAKE 5 MLS BY MOUTH 3 (THREE) TIMES DAILY AS NEEDED. 08/31/20   Etta Grandchild, MD  Fluticasone-Umeclidin-Vilant (TRELEGY ELLIPTA) 100-62.5-25 MCG/INH AEPB  Inhale 1 puff into the lungs daily. 10/22/19   Etta Grandchild, MD  nebivolol (BYSTOLIC) 5 MG tablet TAKE 1 TABLET BY MOUTH EVERY DAY 12/19/20   Etta Grandchild, MD  omeprazole (PRILOSEC) 40 MG capsule TAKE 1 CAPSULE (40 MG TOTAL) BY MOUTH 2 (TWO) TIMES DAILY BEFORE A MEAL. 01/16/21   Rachael Fee, MD  rosuvastatin (CRESTOR) 5 MG tablet TAKE 1 TABLET BY MOUTH EVERY DAY 08/31/20   Etta Grandchild, MD  triamterene-hydrochlorothiazide (DYAZIDE) 37.5-25 MG capsule TAKE 1 CAPSULE BY MOUTH EVERY DAY 10/16/20   Etta Grandchild, MD    Allergies    Patient has no known allergies.  Review of Systems   Review of Systems  Constitutional:  Negative for chills and fever.  Musculoskeletal:  Negative for arthralgias and myalgias.  Skin:  Positive for wound.  Allergic/Immunologic: Negative for immunocompromised state.  Neurological:  Negative for weakness.  Hematological:  Negative for adenopathy.  Psychiatric/Behavioral:  Negative for confusion.   All other systems reviewed and are negative.  Physical Exam Updated Vital Signs BP (!) 149/79 (BP Location: Left Arm)   Pulse (!) 59   Temp 98.7 F (37.1 C) (Oral)   Resp 18   SpO2 100%   Physical Exam Vitals and nursing note reviewed.  Constitutional:      General: She is not in acute distress.    Appearance: She is well-developed. She is not diaphoretic.  HENT:     Head: Normocephalic and atraumatic.  Pulmonary:     Effort: Pulmonary effort is normal.  Musculoskeletal:        General: No swelling or tenderness. Normal range of motion.  Skin:    General: Skin is warm and dry.     Findings: Erythema present.       Neurological:     Mental Status: She is alert and oriented to person, place, and time.  Psychiatric:        Behavior: Behavior normal.    ED Results / Procedures / Treatments   Labs (all labs ordered are listed, but only abnormal results are displayed) Labs Reviewed - No data to display  EKG None  Radiology No results  found.  Procedures Procedures   Medications Ordered in ED Medications - No data to display  ED Course  I have reviewed the triage vital signs and the nursing notes.  Pertinent labs & imaging results that were available during my care of the patient were reviewed by me and considered in my medical decision making (see chart for details).  Clinical Course  as of 01/17/21 1602  Sat Jan 17, 2021  2911 70 year old female with recent tick bite to left upper back, found to have area of erythema with induration, no classic EM rash present.  Plan is to treat with doxycycline for tick bite/cellulitis.  Recommend recheck with PCP as needed. [LM]    Clinical Course User Index [LM] Alden Hipp   MDM Rules/Calculators/A&P                           Final Clinical Impression(s) / ED Diagnoses Final diagnoses:  Tick bite, unspecified site, initial encounter    Rx / DC Orders ED Discharge Orders          Ordered    doxycycline (VIBRAMYCIN) 100 MG capsule  2 times daily        01/17/21 1559             Jeannie Fend, PA-C 01/17/21 1602    Cheryll Cockayne, MD 01/18/21 1546

## 2021-01-21 ENCOUNTER — Other Ambulatory Visit: Payer: Self-pay

## 2021-01-21 ENCOUNTER — Encounter: Payer: Self-pay | Admitting: Internal Medicine

## 2021-01-21 ENCOUNTER — Ambulatory Visit (INDEPENDENT_AMBULATORY_CARE_PROVIDER_SITE_OTHER): Payer: Medicare Other | Admitting: Internal Medicine

## 2021-01-21 VITALS — BP 160/74 | HR 61 | Temp 98.0°F | Resp 16 | Ht <= 58 in | Wt 93.2 lb

## 2021-01-21 DIAGNOSIS — R9431 Abnormal electrocardiogram [ECG] [EKG]: Secondary | ICD-10-CM | POA: Diagnosis not present

## 2021-01-21 DIAGNOSIS — E7800 Pure hypercholesterolemia, unspecified: Secondary | ICD-10-CM | POA: Diagnosis not present

## 2021-01-21 DIAGNOSIS — J449 Chronic obstructive pulmonary disease, unspecified: Secondary | ICD-10-CM | POA: Diagnosis not present

## 2021-01-21 DIAGNOSIS — I10 Essential (primary) hypertension: Secondary | ICD-10-CM

## 2021-01-21 DIAGNOSIS — Z0000 Encounter for general adult medical examination without abnormal findings: Secondary | ICD-10-CM | POA: Diagnosis not present

## 2021-01-21 DIAGNOSIS — Z23 Encounter for immunization: Secondary | ICD-10-CM

## 2021-01-21 LAB — HEPATIC FUNCTION PANEL
ALT: 14 U/L (ref 0–35)
AST: 17 U/L (ref 0–37)
Albumin: 4.7 g/dL (ref 3.5–5.2)
Alkaline Phosphatase: 57 U/L (ref 39–117)
Bilirubin, Direct: 0.1 mg/dL (ref 0.0–0.3)
Total Bilirubin: 0.7 mg/dL (ref 0.2–1.2)
Total Protein: 7.5 g/dL (ref 6.0–8.3)

## 2021-01-21 LAB — BASIC METABOLIC PANEL
BUN: 13 mg/dL (ref 6–23)
CO2: 27 mEq/L (ref 19–32)
Calcium: 9.4 mg/dL (ref 8.4–10.5)
Chloride: 100 mEq/L (ref 96–112)
Creatinine, Ser: 0.77 mg/dL (ref 0.40–1.20)
GFR: 79.24 mL/min (ref >60.00)
Glucose, Bld: 86 mg/dL (ref 70–99)
Potassium: 3.2 mEq/L — ABNORMAL LOW (ref 3.5–5.1)
Sodium: 138 mEq/L (ref 135–145)

## 2021-01-21 LAB — LIPID PANEL
Cholesterol: 179 mg/dL (ref 0–200)
HDL: 62.1 mg/dL (ref >39.00)
LDL Cholesterol: 94 mg/dL (ref 0–99)
NonHDL: 117.1
Total CHOL/HDL Ratio: 3
Triglycerides: 117 mg/dL (ref 0.0–149.0)
VLDL: 23.4 mg/dL (ref 0.0–40.0)

## 2021-01-21 LAB — CBC WITH DIFFERENTIAL/PLATELET
Basophils Absolute: 0 10*3/uL (ref 0.0–0.1)
Basophils Relative: 0.6 % (ref 0.0–3.0)
Eosinophils Absolute: 0.1 10*3/uL (ref 0.0–0.7)
Eosinophils Relative: 1.1 % (ref 0.0–5.0)
HCT: 38.5 % (ref 36.0–46.0)
Hemoglobin: 12.9 g/dL (ref 12.0–15.0)
Lymphocytes Relative: 43.3 % (ref 12.0–46.0)
Lymphs Abs: 2.9 10*3/uL (ref 0.7–4.0)
MCHC: 33.4 g/dL (ref 30.0–36.0)
MCV: 90 fl (ref 78.0–100.0)
Monocytes Absolute: 0.9 10*3/uL (ref 0.1–1.0)
Monocytes Relative: 13.5 % — ABNORMAL HIGH (ref 3.0–12.0)
Neutro Abs: 2.7 10*3/uL (ref 1.4–7.7)
Neutrophils Relative %: 41.5 % — ABNORMAL LOW (ref 43.0–77.0)
Platelets: 243 10*3/uL (ref 150.0–400.0)
RBC: 4.28 Mil/uL (ref 3.87–5.11)
RDW: 13.2 % (ref 11.5–15.5)
WBC: 6.6 10*3/uL (ref 4.0–10.5)

## 2021-01-21 LAB — TSH: TSH: 2.23 u[IU]/mL (ref 0.35–4.50)

## 2021-01-21 MED ORDER — TRIAMTERENE-HCTZ 37.5-25 MG PO CAPS: 1.0000 | ORAL_CAPSULE | Freq: Every day | ORAL | 0 refills | Status: DC

## 2021-01-21 MED ORDER — ROSUVASTATIN CALCIUM 5 MG PO TABS: 5.0000 mg | ORAL_TABLET | Freq: Every day | ORAL | 0 refills | Status: DC

## 2021-01-21 NOTE — Progress Notes (Signed)
Subjective:  Patient ID: Amber Griffith, female    DOB: 1950-09-13  Age: 70 y.o. MRN: [id number]  CC: Annual Exam and Hypertension  This visit occurred during the SARS-CoV-2 public health emergency.  Safety protocols were in place, including screening questions prior to the visit, additional usage of staff PPE, and extensive cleaning of exam room while observing appropriate contact time as indicated for disinfecting solutions.    HPI Amber Griffith presents for a CPX and f/up -   She continues to smoke cigs and complains of cough that is productive of clear phlegm with shortness of breath and wheezing.  She tells me she is taking one of her antihypertensives but not both of them.  Based on her description it sounds like she is taking nebivolol but not Dyazide.  She denies headache, blurred vision, chest pain, diaphoresis, edema, dizziness, or lightheadedness.  Outpatient Medications Prior to Visit  Medication Sig Dispense Refill   albuterol (VENTOLIN HFA) 108 (90 Base) MCG/ACT inhaler Inhale 2 puffs into the lungs every 6 (six) hours as needed for wheezing or shortness of breath. 6.7 g 1   [facility] REL[facility]F EX [facility] 254-237.5 MG/5ML SUSP TAKE 5 MLS BY MOUTH 3 (THREE) TIMES DAILY AS NEEDED. 355 mL 0   doxycycline (VIBRAMYCIN) 100 MG capsule Take 1 capsule (100 mg total) by mouth 2 (two) times daily. 20 capsule 0   omeprazole (PRILOSEC) 40 MG capsule TAKE 1 CAPSULE (40 MG TOTAL) BY MOUTH 2 (TWO) TIMES DAILY BEFORE A MEAL. 180 capsule 3   Fluticasone-Umeclidin-Vilant (TRELEGY ELLIPTA) 100-62.5-25 MCG/INH AEPB Inhale 1 puff into the lungs daily. 90 each 1   nebivolol (BYSTOLIC) 5 MG tablet TAKE 1 TABLET BY MOUTH EVERY DAY 90 tablet 1   rosuvastatin (CRESTOR) 5 MG tablet TAKE 1 TABLET BY MOUTH EVERY DAY 90 tablet 0   triamterene-hydrochlorothiazide (DYAZIDE) 37.5-25 MG capsule TAKE 1 CAPSULE BY MOUTH EVERY DAY 90 capsule 0   No facility-administered medications prior to visit.     ROS Review of Systems  Constitutional: Negative.  Negative for diaphoresis and fatigue.  HENT: Negative.    Eyes: Negative.   Respiratory:  Positive for cough and shortness of breath. Negative for chest tightness and wheezing.   Cardiovascular:  Negative for chest pain, palpitations and leg swelling.  Gastrointestinal:  Negative for abdominal pain, constipation and diarrhea.  Endocrine: Negative.   Genitourinary: Negative.  Negative for difficulty urinating and hematuria.  Musculoskeletal: Negative.  Negative for arthralgias and myalgias.  Skin: Negative.   Neurological: Negative.  Negative for dizziness, syncope, speech difficulty, weakness, light-headedness and headaches.  Hematological:  Negative for adenopathy. Does not bruise/bleed easily.  Psychiatric/Behavioral: Negative.     Objective:  BP (!) 160/74 (BP Location: Right Arm, Patient Position: Sitting, Cuff Size: Normal)   Pulse 61   Temp 98 F (36.7 C) (Oral)   Resp 16   Ht 4\' 10"  (1.473 m)   Wt 93 lb 3.2 oz (42.3 kg)   SpO2 99%   BMI 19.48 kg/m   BP Readings from Last 3 Encounters:  01/21/21 (!) 160/74  01/17/21 (!) 149/79  06/23/20 138/74    Wt Readings from Last 3 Encounters:  01/21/21 93 lb 3.2 oz (42.3 kg)  06/23/20 94 lb (42.6 kg)  06/16/20 94 lb 6.4 oz (42.8 kg)    Physical Exam Vitals reviewed.  Constitutional:      Appearance: Normal appearance.  HENT:     Nose: Nose normal.     Mouth/Throat:  Mouth: Mucous membranes are moist.  Eyes:     Conjunctiva/sclera: Conjunctivae normal.  Cardiovascular:     Rate and Rhythm: Regular rhythm. Bradycardia present.     Heart sounds: Normal heart sounds, S1 normal and S2 normal. No murmur heard.   No gallop.     Comments: EKG- Sinus bradycardia, 56 bpm TWI in anterior leads is new (?LVH with repol) Q in III is not new Pulmonary:     Effort: Pulmonary effort is normal. No respiratory distress.     Breath sounds: No stridor. Examination of the  right-lower field reveals rhonchi. Examination of the left-lower field reveals rhonchi. Rhonchi present. No wheezing or rales.  Abdominal:     General: Abdomen is flat.     Palpations: There is no mass.     Tenderness: There is no abdominal tenderness. There is no guarding.     Hernia: No hernia is present.  Musculoskeletal:     Cervical back: Neck supple.     Right lower leg: No edema.     Left lower leg: No edema.  Skin:    General: Skin is warm and dry.  Neurological:     General: No focal deficit present.     Mental Status: She is alert.  Psychiatric:        Mood and Affect: Mood normal.        Behavior: Behavior normal.    Lab Results  Component Value Date   WBC 6.6 01/21/2021   HGB 12.9 01/21/2021   HCT 38.5 01/21/2021   PLT 243.0 01/21/2021   GLUCOSE 86 01/21/2021   CHOL 179 01/21/2021   TRIG 117.0 01/21/2021   HDL 62.10 01/21/2021   LDLDIRECT 84.0 01/25/2018   LDLCALC 94 01/21/2021   ALT 14 01/21/2021   AST 17 01/21/2021   NA 138 01/21/2021   K 3.2 (L) 01/21/2021   CL 100 01/21/2021   CREATININE 0.77 01/21/2021   BUN 13 01/21/2021   CO2 27 01/21/2021   TSH 2.23 01/21/2021   HGBA1C 4.9 07/30/2013    No results found.  Assessment & Plan:   Amber Griffith was seen today for annual exam and hypertension.  Diagnoses and all orders for this visit:  Essential hypertension- Her blood pressure is not adequately well controlled and her potassium level is low.  I recommended that she restart Dyazide and to stay on the current dose of nebivolol. -     triamterene-hydrochlorothiazide (DYAZIDE) 37.5-25 MG capsule; Take 1 each (1 capsule total) by mouth daily. -     EKG 12-Lead -     CBC with Differential/Platelet; Future -     Basic metabolic panel; Future -     Urinalysis, Routine w reflex microscopic; Future -     TSH; Future -     TSH -     Urinalysis, Routine w reflex microscopic -     Basic metabolic panel -     CBC with Differential/Platelet -     nebivolol  (BYSTOLIC) 5 MG tablet; Take 1 tablet (5 mg total) by mouth daily.  Routine general medical examination at a health care facility- Exam completed, labs reviewed, vaccines reviewed and updated, cancer screenings are up-to-date, patient education was given.  Pure hypercholesterolemia- LDL goal achieved. Doing well on the statin  -     rosuvastatin (CRESTOR) 5 MG tablet; Take 1 tablet (5 mg total) by mouth daily. -     Lipid panel; Future -     TSH; Future -  Hepatic function panel; Future -     Hepatic function panel -     TSH -     Lipid panel  Abnormal electrocardiogram (ECG) (EKG)- I have asked her to undergo an echocardiogram to see if she has developed LVH. -     ECHOCARDIOGRAM COMPLETE; Future  COPD (chronic obstructive pulmonary disease) with chronic bronchitis (HCC) -     Fluticasone-Umeclidin-Vilant (TRELEGY ELLIPTA) 100-62.5-25 MCG/INH AEPB; Inhale 1 puff into the lungs daily.  I have changed Amber Griffith's triamterene-hydrochlorothiazide, rosuvastatin, and nebivolol. I am also having her start on Shingrix. Additionally, I am having her maintain her albuterol, [facility], omeprazole, doxycycline, and Trelegy Ellipta.  Meds ordered this encounter  Medications   triamterene-hydrochlorothiazide (DYAZIDE) 37.5-25 MG capsule    Sig: Take 1 each (1 capsule total) by mouth daily.    Dispense:  90 capsule    Refill:  0   rosuvastatin (CRESTOR) 5 MG tablet    Sig: Take 1 tablet (5 mg total) by mouth daily.    Dispense:  90 tablet    Refill:  0   Fluticasone-Umeclidin-Vilant (TRELEGY ELLIPTA) 100-62.5-25 MCG/INH AEPB    Sig: Inhale 1 puff into the lungs daily.    Dispense:  120 each    Refill:  1   nebivolol (BYSTOLIC) 5 MG tablet    Sig: Take 1 tablet (5 mg total) by mouth daily.    Dispense:  90 tablet    Refill:  1   Zoster Vaccine Adjuvanted [facility]) injection    Sig: Inject 0.5 mLs into the muscle once for 1 dose.    Dispense:  0.5 mL    Refill:   1      Follow-up: Return in about 3 months (around 04/23/2021).  Sanda Linger, MD

## 2021-01-21 NOTE — Patient Instructions (Signed)

## 2021-01-22 ENCOUNTER — Telehealth: Payer: Self-pay | Admitting: Internal Medicine

## 2021-01-22 LAB — URINALYSIS, ROUTINE W REFLEX MICROSCOPIC
Bilirubin Urine: NEGATIVE
Ketones, ur: NEGATIVE
Leukocytes,Ua: NEGATIVE
Nitrite: NEGATIVE
Specific Gravity, Urine: <=1.005 — AB (ref 1.000–1.030)
Total Protein, Urine: NEGATIVE
Urine Glucose: NEGATIVE
Urobilinogen, UA: 0.2 (ref 0.0–1.0)
pH: 6 (ref 5.0–8.0)

## 2021-01-22 MED ORDER — NEBIVOLOL HCL 5 MG PO TABS: 5.0000 mg | ORAL_TABLET | Freq: Every day | ORAL | 1 refills | Status: DC

## 2021-01-22 MED ORDER — TRELEGY ELLIPTA 100-62.5-25 MCG/INH IN AEPB: 1.0000 | INHALATION_SPRAY | Freq: Every day | RESPIRATORY_TRACT | 1 refills | Status: DC

## 2021-01-22 NOTE — Telephone Encounter (Signed)
    Patient is requesting a call back in regards to her medications that were sent in yesterday

## 2021-01-22 NOTE — Telephone Encounter (Signed)
Pt stated that she doesn't know what she did with her Bystolic Rx. She would like another refill sent in. Please advise.

## 2021-01-24 ENCOUNTER — Encounter: Payer: Self-pay | Admitting: Internal Medicine

## 2021-01-24 MED ORDER — SHINGRIX 50 MCG/0.5ML IM SUSR: 0.5000 mL | Freq: Once | INTRAMUSCULAR | 1 refills | Status: AC

## 2021-02-04 ENCOUNTER — Telehealth (INDEPENDENT_AMBULATORY_CARE_PROVIDER_SITE_OTHER): Payer: Medicare Other | Admitting: Family Medicine

## 2021-02-04 ENCOUNTER — Encounter: Payer: Self-pay | Admitting: Family Medicine

## 2021-02-04 VITALS — Wt 93.0 lb

## 2021-02-04 DIAGNOSIS — J449 Chronic obstructive pulmonary disease, unspecified: Secondary | ICD-10-CM | POA: Diagnosis not present

## 2021-02-04 DIAGNOSIS — U071 COVID-19: Secondary | ICD-10-CM | POA: Insufficient documentation

## 2021-02-04 MED ORDER — NIRMATRELVIR/RITONAVIR (PAXLOVID)TABLET: 3.0000 | ORAL_TABLET | Freq: Two times a day (BID) | ORAL | 0 refills | Status: AC

## 2021-02-04 NOTE — Progress Notes (Signed)
Subjective:    Patient ID: Amber Griffith, female    DOB: [birth year], 70 y.o.   MRN: [id number]  HPI Virtual Visit via Video Note  I connected with the patient on 02/04/21 at  4:00 PM EDT by a video enabled telemedicine application and verified that I am speaking with the correct person using two identifiers.  Location patient: home Location provider:work or home office Persons participating in the virtual visit: patient, provider  I discussed the limitations of evaluation and management by telemedicine and the availability of in person appointments. The patient expressed understanding and agreed to proceed.   HPI: Here for a Covid-19 infection. She began to feel ill 4 days ago with chest congestion and a dry cough. No chest pain or fever. She has COPD, but this did not cause her to feel more SOB than usual. No body aches or NVD. She tested positive for the Covid virus this morning at a testing site in [place] [address]. She is drinking fluids and using her albuterol inhaler several times a day.    ROS: See pertinent positives and negatives per HPI.  Past Medical History:  Diagnosis Date   ALLERGIC RHINITIS 09/01/2007   Anxiety and depression 12/24/2010   Cardiomegaly 01/17/2009   CONSTIPATION, CHRONIC 12/18/2008   COPD 04/04/2007   ELECTROCARDIOGRAM, ABNORMAL 01/17/2009   GERD 04/04/2007   HEADACHE, TENSION 04/18/2008   HEMORRHOIDS 09/25/2007   HYPERTENSION 06/12/2007   SINUSITIS- ACUTE-NOS 05/08/2010   TOBACCO ABUSE 04/04/2007   UTI 09/30/2010   Vaginitis and vulvovaginitis, unspecified 09/30/2010    Past Surgical History:  Procedure Laterality Date   AMPUTATION Right 09/23/2017   Procedure: REVISION AMPUTATION RIGHT INDEX FINGER;  Surgeon: Betha Loa, MD;  Location: [facility];  Service: Orthopedics;  Laterality: Right;   BREAST BIOPSY Left    TONSILLECTOMY     TUBAL LIGATION     UMBILICAL HERNIA REPAIR      Family History  Problem Relation Age of Onset    Asthma Mother    Hypertension Mother    COPD Maternal Uncle    Hypertension Father    Seizures Father    Stroke Paternal Uncle    Colon cancer Neg Hx    Esophageal cancer Neg Hx    Stomach cancer Neg Hx    Rectal cancer Neg Hx      Current Outpatient Medications:    albuterol (VENTOLIN HFA) 108 (90 Base) MCG/ACT inhaler, Inhale 2 puffs into the lungs every 6 (six) hours as needed for wheezing or shortness of breath., Disp: 6.7 g, Rfl: 1   [employer] HEARTBURN RELIEF EX ST 254-237.5 MG/5ML SUSP, TAKE 5 MLS BY MOUTH 3 (THREE) TIMES DAILY AS NEEDED., Disp: 355 mL, Rfl: 0   Fluticasone-Umeclidin-Vilant (TRELEGY ELLIPTA) 100-62.5-25 MCG/INH AEPB, Inhale 1 puff into the lungs daily., Disp: 120 each, Rfl: 1   nebivolol (BYSTOLIC) 5 MG tablet, Take 1 tablet (5 mg total) by mouth daily., Disp: 90 tablet, Rfl: 1   nirmatrelvir/ritonavir EUA (PAXLOVID) TABS, Take 3 tablets by mouth 2 (two) times daily for 5 days. (Take nirmatrelvir 150 mg two tablets twice daily for 5 days and ritonavir 100 mg one tablet twice daily for 5 days) Patient GFR is 79, Disp: 30 tablet, Rfl: 0   omeprazole (PRILOSEC) 40 MG capsule, TAKE 1 CAPSULE (40 MG TOTAL) BY MOUTH 2 (TWO) TIMES DAILY BEFORE A MEAL., Disp: 180 capsule, Rfl: 3   rosuvastatin (CRESTOR) 5 MG tablet, Take 1 tablet (5 mg total)  by mouth daily., Disp: 90 tablet, Rfl: 0   triamterene-hydrochlorothiazide (DYAZIDE) 37.5-25 MG capsule, Take 1 each (1 capsule total) by mouth daily., Disp: 90 capsule, Rfl: 0  EXAM:  VITALS per patient if applicable:  GENERAL: alert, oriented, appears well and in no acute distress  HEENT: atraumatic, conjunttiva clear, no obvious abnormalities on inspection of external nose and ears  NECK: normal movements of the head and neck  LUNGS: on inspection no signs of respiratory distress, breathing rate appears normal, no obvious gross SOB, gasping or wheezing  CV: no obvious cyanosis  MS: moves all visible extremities without  noticeable abnormality  PSYCH/NEURO: pleasant and cooperative, no obvious depression or anxiety, speech and thought processing grossly intact  ASSESSMENT AND PLAN: Covid-19 infection. We will treat with 5 days of Paxlovid. Recheck as needed. She knows to quarantine for 5 days.  Gershon Crane, MD  Discussed the following assessment and plan:  No diagnosis found.     I discussed the assessment and treatment plan with the patient. The patient was provided an opportunity to ask questions and all were answered. The patient agreed with the plan and demonstrated an understanding of the instructions.   The patient was advised to call back or seek an in-person evaluation if the symptoms worsen or if the condition fails to improve as anticipated.      Review of Systems     Objective:   Physical Exam        Assessment & Plan:

## 2021-03-13 ENCOUNTER — Ambulatory Visit (INDEPENDENT_AMBULATORY_CARE_PROVIDER_SITE_OTHER): Payer: Medicare Other | Admitting: Internal Medicine

## 2021-03-13 ENCOUNTER — Encounter: Payer: Self-pay | Admitting: Internal Medicine

## 2021-03-13 ENCOUNTER — Other Ambulatory Visit: Payer: Self-pay

## 2021-03-13 VITALS — BP 118/80 | HR 56 | Resp 18 | Ht <= 58 in | Wt 91.2 lb

## 2021-03-13 DIAGNOSIS — I739 Peripheral vascular disease, unspecified: Secondary | ICD-10-CM | POA: Insufficient documentation

## 2021-03-13 DIAGNOSIS — F17200 Nicotine dependence, unspecified, uncomplicated: Secondary | ICD-10-CM

## 2021-03-13 NOTE — Assessment & Plan Note (Signed)
Advised to quit.  

## 2021-03-13 NOTE — Progress Notes (Signed)
   Subjective:   Patient ID: Amber Griffith, female    DOB: April 25, 1951, 70 y.o.   MRN: [id number]  HPI The patient is a 70 YO female coming in for new cold sensation in her feet going on for a week and new pain in her calves with walking. Stops within a few minutes of stopping or sitting. She is a smoker about 1 PPD.  Review of Systems  Constitutional: Negative.   HENT: Negative.    Eyes: Negative.   Respiratory:  Negative for cough, chest tightness and shortness of breath.   Cardiovascular:  Negative for chest pain, palpitations and leg swelling.  Gastrointestinal:  Negative for abdominal distention, abdominal pain, constipation, diarrhea, nausea and vomiting.  Musculoskeletal:  Positive for myalgias.  Skin: Negative.  Negative for color change.  Neurological: Negative.   Psychiatric/Behavioral: Negative.     Objective:  Physical Exam Constitutional:      Appearance: She is well-developed.  HENT:     Head: Normocephalic and atraumatic.  Cardiovascular:     Rate and Rhythm: Normal rate and regular rhythm.  Pulmonary:     Effort: Pulmonary effort is normal. No respiratory distress.     Breath sounds: Normal breath sounds. No wheezing or rales.  Abdominal:     General: Bowel sounds are normal. There is no distension.     Palpations: Abdomen is soft.     Tenderness: There is no abdominal tenderness. There is no rebound.  Musculoskeletal:     Cervical back: Normal range of motion.  Skin:    General: Skin is warm and dry.     Comments: Pulses at the PT or DP not palpable, feet warm, some coldness to the toes more on the right. No pain to palpation of the calf.   Neurological:     Mental Status: She is alert and oriented to person, place, and time.     Coordination: Coordination normal.    Vitals:   03/13/21 1542  BP: 118/80  Pulse: (!) 56  Resp: 18  SpO2: 99%  Weight: 91 lb 3.2 oz (41.4 kg)  Height: 4\' 10"  (1.473 m)    This visit occurred during the SARS-CoV-2  public health emergency.  Safety protocols were in place, including screening questions prior to the visit, additional usage of staff PPE, and extensive cleaning of exam room while observing appropriate contact time as indicated for disinfecting solutions.   Assessment & Plan:

## 2021-03-13 NOTE — Assessment & Plan Note (Addendum)
Strong suspicion for PAD given smoking history and clinical history. Ordered ABI bilateral. Encouraged to stop smoking. Given prior low potassium rechecking CMP and magnesium today to rule out alternate etiology.

## 2021-03-13 NOTE — Patient Instructions (Signed)
We will check the blood work and the ultrasound to check the circulation.

## 2021-03-14 LAB — MAGNESIUM: Magnesium: 1.8 mg/dL (ref 1.5–2.5)

## 2021-03-14 LAB — COMPREHENSIVE METABOLIC PANEL
AG Ratio: 2.2 (calc) (ref 1.0–2.5)
ALT: 13 U/L (ref 6–29)
AST: 15 U/L (ref 10–35)
Albumin: 4.6 g/dL (ref 3.6–5.1)
Alkaline phosphatase (APISO): 68 U/L (ref 37–153)
BUN: 17 mg/dL (ref 7–25)
CO2: 32 mmol/L (ref 20–32)
Calcium: 10.3 mg/dL (ref 8.6–10.4)
Chloride: 105 mmol/L (ref 98–110)
Creat: 0.75 mg/dL (ref 0.50–1.05)
Globulin: 2.1 g/dL (calc) (ref 1.9–3.7)
Glucose, Bld: 86 mg/dL (ref 65–99)
Potassium: 4 mmol/L (ref 3.5–5.3)
Sodium: 141 mmol/L (ref 135–146)
Total Bilirubin: 0.5 mg/dL (ref 0.2–1.2)
Total Protein: 6.7 g/dL (ref 6.1–8.1)

## 2021-03-17 ENCOUNTER — Ambulatory Visit (HOSPITAL_COMMUNITY)
Admission: RE | Admit: 2021-03-17 | Discharge: 2021-03-17 | Disposition: A | Discharge status: Home / Self Care | Payer: Medicare Other | Source: Ambulatory Visit | Attending: Cardiology | Admitting: Cardiology

## 2021-03-17 ENCOUNTER — Other Ambulatory Visit: Payer: Self-pay | Admitting: Internal Medicine

## 2021-03-17 ENCOUNTER — Other Ambulatory Visit: Payer: Self-pay

## 2021-03-17 DIAGNOSIS — I739 Peripheral vascular disease, unspecified: Secondary | ICD-10-CM | POA: Diagnosis not present

## 2021-03-17 MED ORDER — CILOSTAZOL 50 MG PO TABS: 50.0000 mg | ORAL_TABLET | Freq: Two times a day (BID) | ORAL | 0 refills | Status: DC

## 2021-03-19 ENCOUNTER — Telehealth: Payer: Self-pay

## 2021-03-19 NOTE — Telephone Encounter (Signed)
  Patient has been notified of lab results and told ultrasound results are not back yet.

## 2021-03-23 ENCOUNTER — Telehealth: Payer: Self-pay | Admitting: Internal Medicine

## 2021-03-23 NOTE — Telephone Encounter (Signed)
Patient calling in to go over results from last weeks testing  Please follow up w/ patient

## 2021-03-23 NOTE — Telephone Encounter (Signed)
   Patient calling for vascular ultrasound result

## 2021-03-24 NOTE — Telephone Encounter (Signed)
Called patient. LVM asking her to give our office a call to discuss her results. Office number was provided.

## 2021-03-25 NOTE — Telephone Encounter (Signed)
Please call patient today with results from vascular US Patient is very anxious about result

## 2021-03-25 NOTE — Telephone Encounter (Signed)
Spoke with the patient today and on 03/19/2021 in regards to her [place] results.

## 2021-03-25 NOTE — Telephone Encounter (Signed)
Follow up message  Please call patient to discuss results

## 2021-04-13 ENCOUNTER — Ambulatory Visit (INDEPENDENT_AMBULATORY_CARE_PROVIDER_SITE_OTHER): Payer: Medicare Other | Admitting: Internal Medicine

## 2021-04-13 ENCOUNTER — Other Ambulatory Visit: Payer: Self-pay

## 2021-04-13 ENCOUNTER — Encounter: Payer: Self-pay | Admitting: Internal Medicine

## 2021-04-13 VITALS — BP 132/78 | HR 60 | Temp 98.0°F | Resp 16 | Ht <= 58 in | Wt 92.0 lb

## 2021-04-13 DIAGNOSIS — I10 Essential (primary) hypertension: Secondary | ICD-10-CM | POA: Diagnosis not present

## 2021-04-13 DIAGNOSIS — I779 Disorder of arteries and arterioles, unspecified: Secondary | ICD-10-CM

## 2021-04-13 DIAGNOSIS — Z23 Encounter for immunization: Secondary | ICD-10-CM | POA: Diagnosis not present

## 2021-04-13 MED ORDER — BOOSTRIX 5-2.5-18.5 LF-MCG/0.5 IM SUSP: 0.5000 mL | Freq: Once | INTRAMUSCULAR | 0 refills | Status: AC

## 2021-04-13 MED ORDER — ASPIRIN 81 MG PO TBEC: 81.0000 mg | DELAYED_RELEASE_TABLET | Freq: Every day | ORAL | 1 refills | Status: DC

## 2021-04-13 MED ORDER — RIVAROXABAN 2.5 MG PO TABS: 2.5000 mg | ORAL_TABLET | Freq: Two times a day (BID) | ORAL | 0 refills | Status: DC

## 2021-04-13 NOTE — Patient Instructions (Signed)
Peripheral Vascular Disease °Peripheral vascular disease (PVD) is a disease of the blood vessels. PVD may also be called peripheral artery disease (PAD) or poor circulation. PVD is the blocking or hardening of the arteries anywhere within the circulatory system beyond the heart. This can result in a decreased supply of blood to the arms, legs, and internal organs, such as the stomach or kidneys. However, PVD most often affects a person's lower legs and feet. Without treatment, PVD often worsens. °PVD can lead to acute limb ischemia. This occurs when an arm or leg suddenly has trouble getting enough blood. This is a medical emergency. °What are the causes? °The most common cause of PVD is atherosclerosis. This is a buildup of fatty material and other substances (plaque)inside your arteries. Pieces of plaque can break off from the walls of an artery and become stuck in a smaller artery, blocking blood flow and possibly causing acute limb ischemia. °Other common causes of PVD include: °Blood clots that form inside the blood vessels. °Injuries to blood vessels. °Diseases that cause inflammation of blood vessels or cause blood vessel tightening (spasms). °What increases the risk? °The following factors may make you more likely to develop this condition: °A family history of PVD. °Common medical conditions, including: °High cholesterol. °Diabetes. °High blood pressure (hypertension). °Heart disease. °Known atherosclerotic disease in another area of the body. °Past injury, such as burns or a broken bone. °Other medical conditions, such as: °Buerger's disease. This is caused by inflamed blood vessels in your hands and feet. °Some forms of arthritis. °Birth defects that affect the arteries in your legs. °Kidney disease. °Using tobacco and nicotine products. °Not getting enough exercise. °Obesity. °Being age 65 or older, or being age 50 or older and having the other risk factors. °What are the signs or symptoms? °This  condition may cause different symptoms. Your symptoms depend on what body part is not getting enough blood. Common signs and symptoms include: °Cramps in your buttocks, legs, and feet. °Intermittent claudication. This is pain and weakness in your legs during activity that resolves with rest. °Leg pain at rest and leg numbness, tingling, or weakness. °Coldness in a leg or foot, especially when compared to the other leg or foot. °Skin or hair changes. These can include: °Hair loss. °Shiny skin. °Pale or bluish skin. °Thick toenails. °Inability to get or maintain an erection (erectile dysfunction). °Tiredness (fatigue). °Weak pulse or no pulse in the feet. °People with PVD are more likely to develop open wounds (ulcers) and sores on their toes, feet, or legs. The ulcers or sores may take longer than normal to heal. °How is this diagnosed? °PVD is diagnosed based on your signs and symptoms, a physical exam, and your medical history. You may also have other tests to find the cause. Tests include: °Ankle-brachial index test.This test compares the blood pressure readings of the legs and arms. °This may also include an exercise ankle-brachial index test in which you walk on a treadmill to check your symptoms. °Doppler ultrasound. This takes pictures of blood flow through your blood vessels. °Imaging studies that use dye to show blood flow. These are: °CT angiogram. °Magnetic resonance angiogram, or MRA. °How is this treated? °Treatment for PVD depends on the cause of your condition, how severe your symptoms are, and your age. Underlying causes need to be treated and controlled. These include long-term (chronic) conditions, such as diabetes, high cholesterol, and hypertension. Treatment may include: °Lifestyle changes, such as: °Quitting tobacco use. °Exercising regularly. °Following a low-fat,   low-cholesterol diet. °Not drinking alcohol. °Taking medicines, such as: °Blood thinners to prevent blood clots. °Medicines to  improve blood flow. °Medicines to improve cholesterol levels. °Procedures, such as: °Angioplasty. This uses an inflated balloon to open a blocked artery and improve blood flow. °Stent implant. This inserts a small mesh tube to keep a blocked artery open. °Peripheral bypass surgery. This reroutes blood flow around a blocked artery. °Surgery to remove dead tissue from an infected wound (debridement). °Amputation. This is surgical removal of the affected limb. It may be necessary in cases of acute limb ischemia when medical or surgical treatments have not helped. °Follow these instructions at home: °Medicines °Take over-the-counter and prescription medicines only as told by your health care provider. °If you are taking blood thinners: °Talk with your health care provider before you take any medicines that contain aspirin or NSAIDs, such as ibuprofen. These medicines increase your risk for dangerous bleeding. °Take your medicine exactly as told, at the same time every day. °Avoid activities that could cause injury or bruising, and follow instructions about how to prevent falls. °Wear a medical alert bracelet or carry a card that lists what medicines you take. °Lifestyle °  °Exercise regularly. Ask your health care provider about some good activities for you. °Talk with your health care provider about maintaining a healthy weight. If needed, ask about losing weight. °Eat a diet that is low in fat and cholesterol. If you need help, talk with your health care provider. °Do not drink alcohol. °Do not use any products that contain nicotine or tobacco. These products include cigarettes, chewing tobacco, and vaping devices, such as e-cigarettes. If you need help quitting, ask your health care provider. °General instructions °Take good care of your feet. To do this: °Wear comfortable shoes that fit well. °Check your feet often for any cuts or sores. °Get an annual influenza vaccine. °Keep all follow-up visits. This is  important. °Where to find more information °Society for Vascular Surgery: vascular.org °American Heart Association: heart.org °National Heart, Lung, and Blood Institute: nhlbi.nih.gov °Contact a health care provider if: °You have leg cramps while walking. °You have leg pain when you rest. °Your leg or foot feels cold. °Your skin changes color. °You have erectile dysfunction. °You have cuts or sores on your legs or feet that do not heal. °Get help right away if: °You have sudden changes in color and feeling of your arms or legs, such as: °Your arm or leg turns cold, numb, and blue. °Your arm or leg becomes red, warm, swollen, painful, or numb. °You have any symptoms of a stroke. "BE FAST" is an easy way to remember the main warning signs of a stroke: °B - Balance. Signs are dizziness, sudden trouble walking, or loss of balance. °E - Eyes. Signs are trouble seeing or a sudden change in vision. °F - Face. Signs are sudden weakness or numbness of the face, or the face or eyelid drooping on one side. °A - Arms. Signs are weakness or numbness in an arm. This happens suddenly and usually on one side of the body. °S - Speech. Signs are sudden trouble speaking, slurred speech, or trouble understanding what people say. °T - Time. Time to call emergency services. Write down what time symptoms started. °You have other signs of a stroke, such as: °A sudden, severe headache with no known cause. °Nausea or vomiting. °Seizure. °You have chest pain or trouble breathing. °These symptoms may represent a serious problem that is an emergency. Do not wait   to see if the symptoms will go away. Get medical help right away. Call your local emergency services (911 in the [place] Do not drive yourself to the hospital. °Summary °Peripheral vascular disease (PVD) is a disease of the blood vessels. °PVD is the blocking or hardening of the arteries anywhere within the circulatory system beyond the heart. °PVD may cause different symptoms. Your  symptoms depend on what part of your body is not getting enough blood. °Treatment for PVD depends on what caused it, how severe your symptoms are, and your age. °This information is not intended to replace advice given to you by your health care provider. Make sure you discuss any questions you have with your health care provider. °Document Revised: 01/21/2020 Document Reviewed: 01/21/2020 °Elsevier Patient Education © 2022 [employer] ° °

## 2021-04-13 NOTE — Progress Notes (Signed)
Subjective:  Patient ID: Amber Griffith, female    DOB: 28-Oct-1950  Age: 70 y.o. MRN: [id number]  CC: Hypertension  This visit occurred during the SARS-CoV-2 public health emergency.  Safety protocols were in place, including screening questions prior to the visit, additional usage of staff PPE, and extensive cleaning of exam room while observing appropriate contact time as indicated for disinfecting solutions.    HPI Amber Griffith presents for f/up -  She recently developed claudication and saw another provider.  She has moderately severe PAOD.  She was started on cilostazol.  She complains that her right foot feels like it is a block of ice.  Outpatient Medications Prior to Visit  Medication Sig Dispense Refill   albuterol (VENTOLIN HFA) 108 (90 Base) MCG/ACT inhaler Inhale 2 puffs into the lungs every 6 (six) hours as needed for wheezing or shortness of breath. 6.7 g 1   [employer] HEARTBURN RELIEF EX ST 254-237.5 MG/5ML SUSP TAKE 5 MLS BY MOUTH 3 (THREE) TIMES DAILY AS NEEDED. 355 mL 0   Fluticasone-Umeclidin-Vilant (TRELEGY ELLIPTA) 100-62.5-25 MCG/INH AEPB Inhale 1 puff into the lungs daily. 120 each 1   nebivolol (BYSTOLIC) 5 MG tablet Take 1 tablet (5 mg total) by mouth daily. 90 tablet 1   omeprazole (PRILOSEC) 40 MG capsule TAKE 1 CAPSULE (40 MG TOTAL) BY MOUTH 2 (TWO) TIMES DAILY BEFORE A MEAL. 180 capsule 3   rosuvastatin (CRESTOR) 5 MG tablet Take 1 tablet (5 mg total) by mouth daily. 90 tablet 0   triamterene-hydrochlorothiazide (DYAZIDE) 37.5-25 MG capsule Take 1 each (1 capsule total) by mouth daily. 90 capsule 0   cilostazol (PLETAL) 50 MG tablet Take 1 tablet (50 mg total) by mouth 2 (two) times daily. 180 tablet 0   No facility-administered medications prior to visit.    ROS Review of Systems  Constitutional:  Negative for chills, diaphoresis, fatigue and fever.  HENT: Negative.    Eyes: Negative.   Respiratory:  Negative for cough, shortness of breath and  wheezing.   Cardiovascular:  Negative for chest pain, palpitations and leg swelling.  Gastrointestinal:  Negative for abdominal pain, constipation, diarrhea and vomiting.  Endocrine: Negative.   Genitourinary: Negative.   Musculoskeletal:  Negative for arthralgias and myalgias.  Skin: Negative.   Neurological:  Negative for dizziness, weakness, light-headedness and headaches.  Hematological:  Negative for adenopathy. Does not bruise/bleed easily.  Psychiatric/Behavioral: Negative.     Objective:  BP 132/78 (BP Location: Left Arm, Patient Position: Sitting, Cuff Size: Large)   Pulse 60   Temp 98 F (36.7 C) (Oral)   Resp 16   Ht 4\' 10"  (1.473 m)   Wt 92 lb (41.7 kg)   SpO2 97%   BMI 19.23 kg/m   BP Readings from Last 3 Encounters:  04/13/21 132/78  03/13/21 118/80  01/21/21 (!) 160/74    Wt Readings from Last 3 Encounters:  04/13/21 92 lb (41.7 kg)  03/13/21 91 lb 3.2 oz (41.4 kg)  02/04/21 93 lb (42.2 kg)    Physical Exam HENT:     Nose: Nose normal.     Mouth/Throat:     Mouth: Mucous membranes are moist.  Eyes:     Conjunctiva/sclera: Conjunctivae normal.  Cardiovascular:     Rate and Rhythm: Normal rate and regular rhythm.     Pulses:          Dorsalis pedis pulses are 0 on the right side and 1+ on the left side.  Posterior tibial pulses are 0 on the right side and 0 on the left side.  Pulmonary:     Effort: Pulmonary effort is normal.     Breath sounds: No stridor. No wheezing, rhonchi or rales.  Abdominal:     General: Abdomen is flat. Bowel sounds are normal. There is no distension.     Palpations: Abdomen is soft. There is no mass.     Tenderness: There is no abdominal tenderness. There is no guarding.  Musculoskeletal:        General: Normal range of motion.     Cervical back: Neck supple.     Right lower leg: No edema.     Left lower leg: No edema.  Feet:     Right foot:     Skin integrity: Skin integrity normal. No ulcer, skin breakdown or  dry skin.     Toenail Condition: Right toenails are normal.     Left foot:     Skin integrity: Skin integrity normal. No ulcer, skin breakdown or dry skin.     Toenail Condition: Left toenails are normal.  Skin:    General: Skin is warm and dry.  Neurological:     General: No focal deficit present.     Mental Status: She is alert.  Psychiatric:        Mood and Affect: Mood normal.        Behavior: Behavior normal.    Lab Results  Component Value Date   WBC 6.6 01/21/2021   HGB 12.9 01/21/2021   HCT 38.5 01/21/2021   PLT 243.0 01/21/2021   GLUCOSE 86 03/13/2021   CHOL 179 01/21/2021   TRIG 117.0 01/21/2021   HDL 62.10 01/21/2021   LDLDIRECT 84.0 01/25/2018   LDLCALC 94 01/21/2021   ALT 13 03/13/2021   AST 15 03/13/2021   NA 141 03/13/2021   K 4.0 03/13/2021   CL 105 03/13/2021   CREATININE 0.75 03/13/2021   BUN 17 03/13/2021   CO2 32 03/13/2021   TSH 2.23 01/21/2021   HGBA1C 4.9 07/30/2013    VAS US LE ART SEG MULTI (Segm&LE Reynauds)  Result Date: 03/17/2021  LOWER EXTREMITY DOPPLER STUDY Patient Name:  Amber Griffith  Date of Exam:   03/17/2021 Medical Rec #: [id number]             Accession #:    [id number] Date of Birth: 08/10/1950              Patient Gender: F Patient Age:   [age] years Exam Location:  [facility] Procedure:      VAS US LOWER EXT ART SEG MULTI (SEGMENTALS & LE RAYNAUDS) Referring Phys: Lanora ManisELIZABETH CRAWFORD --------------------------------------------------------------------------------  Indications: Claudication.              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 She was recently on a cruise with her son and there              was lots of walking on and off the ship which bothered her legs as              well.  It stops within a few minutes of stopping and  sitting. She              also developed a cold sensation in her feet that's been going on              for about a week now. High Risk Factors: Hypertension, hyperlipidemia, current smoker.  Performing Technologist: Olegario Shearer RVT  Examination Guidelines: A complete evaluation includes at minimum, Doppler waveform signals and systolic blood pressure reading at the level of bilateral brachial, anterior tibial, and posterior tibial arteries, when vessel segments are accessible. Bilateral testing is considered an integral part of a complete examination. Photoelectric Plethysmograph (PPG) waveforms and toe systolic pressure readings are included as required and additional duplex testing as needed. Limited examinations for reoccurring indications may be performed as noted.  ABI Findings: +---------+------------------+-----+--------+---------+ Right    Rt Pressure (mmHg)IndexWaveformComment   +---------+------------------+-----+--------+---------+ Brachial 174                                      +---------+------------------+-----+--------+---------+ ATA      98                0.55                   +---------+------------------+-----+--------+---------+ PTA                        0.55 absent  inaudible +---------+------------------+-----+--------+---------+ PERO     99                                       +---------+------------------+-----+--------+---------+ Great Toe23                0.13 Abnormal          +---------+------------------+-----+--------+---------+ +---------+------------------+-----+--------+---------+ Left     Lt Pressure (mmHg)IndexWaveformComment   +---------+------------------+-----+--------+---------+ Brachial 179                                      +---------+------------------+-----+--------+---------+ ATA      137               0.77                   +---------+------------------+-----+--------+---------+ PTA                              absent  inaudible +---------+------------------+-----+--------+---------+ PERO     119               0.66                   +---------+------------------+-----+--------+---------+ Great Toe97                0.54 Abnormal          +---------+------------------+-----+--------+---------+ +-------+-----------+-----------+------------+------------+ ABI/TBIToday's ABIToday's TBIPrevious ABIPrevious TBI +-------+-----------+-----------+------------+------------+ Right  0.55       0.13                                +-------+-----------+-----------+------------+------------+ Left   0.77       0.54                                +-------+-----------+-----------+------------+------------+  Summary: Right: Resting right ankle-brachial index indicates moderate right lower extremity arterial disease. The right toe-brachial index is abnormal. Left: Resting left ankle-brachial index indicates moderate left lower extremity arterial disease. The left toe-brachial index is abnormal.  *See table(s) above for measurements and observations.  Suggest arterial duplex. Vascular consult recommended. Electronically signed by Julien Nordmann MD on 03/17/2021 at 6:42:34 PM.    Final     Assessment & Plan:   Amber Griffith was seen today for hypertension.  Diagnoses and all orders for this visit:  Essential hypertension- Her blood pressure is adequately well controlled.  PAOD (peripheral arterial occlusive disease) (HCC)- I recommended that she take aspirin 81 mg a day and rivaroxaban 2.5 twice a day to reduce the risk of complications.  I have also recommended that she see vascular surgery to see if she needs to undergo an arteriogram. -     rivaroxaban (XARELTO) 2.5 MG TABS tablet; Take 1 tablet (2.5 mg total) by mouth 2 (two) times daily. -     aspirin 81 MG EC tablet; Take 1 tablet (81 mg total) by mouth daily. Swallow whole. -     Ambulatory referral to Vascular Surgery  Flu vaccine need -      Flu Vaccine QUAD High Dose(Fluad)  Need for prophylactic vaccination with combined diphtheria-tetanus-pertussis (DTP) vaccine -     Tdap (BOOSTRIX) 5-2.5-18.5 LF-MCG/0.5 injection; Inject 0.5 mLs into the muscle once for 1 dose.  I have discontinued Amber Griffith's cilostazol. I am also having her start on rivaroxaban, aspirin, and Boostrix. Additionally, I am having her maintain her albuterol, [facility]S Heartburn Relief Ex St, omeprazole, triamterene-hydrochlorothiazide, rosuvastatin, Trelegy Ellipta, and nebivolol.  Meds ordered this encounter  Medications   rivaroxaban (XARELTO) 2.5 MG TABS tablet    Sig: Take 1 tablet (2.5 mg total) by mouth 2 (two) times daily.    Dispense:  180 tablet    Refill:  0   aspirin 81 MG EC tablet    Sig: Take 1 tablet (81 mg total) by mouth daily. Swallow whole.    Dispense:  90 tablet    Refill:  1   Tdap (BOOSTRIX) 5-2.5-18.5 LF-MCG/0.5 injection    Sig: Inject 0.5 mLs into the muscle once for 1 dose.    Dispense:  0.5 mL    Refill:  0     Follow-up: Return in about 3 months (around 07/13/2021).  Sanda Linger, MD

## 2021-04-17 ENCOUNTER — Other Ambulatory Visit: Payer: Self-pay | Admitting: Internal Medicine

## 2021-04-17 DIAGNOSIS — I10 Essential (primary) hypertension: Secondary | ICD-10-CM

## 2021-04-20 ENCOUNTER — Ambulatory Visit (INDEPENDENT_AMBULATORY_CARE_PROVIDER_SITE_OTHER): Payer: Medicare Other | Admitting: Surgery

## 2021-04-20 ENCOUNTER — Encounter: Payer: Self-pay | Admitting: Surgery

## 2021-04-20 ENCOUNTER — Other Ambulatory Visit: Payer: Self-pay

## 2021-04-20 VITALS — BP 154/74 | HR 53 | Temp 97.9°F | Resp 20 | Ht <= 58 in | Wt 89.0 lb

## 2021-04-20 DIAGNOSIS — I70213 Atherosclerosis of native arteries of extremities with intermittent claudication, bilateral legs: Secondary | ICD-10-CM

## 2021-04-20 NOTE — Progress Notes (Signed)
[facility]  Patient name: Amber Griffith MRN: [id number] DOB: 11/19/1950 Sex: female   REQUESTING PROVIDER:    Dr. Sanda Linger   REASON FOR CONSULT:    PAD  HISTORY OF PRESENT ILLNESS:   Amber Griffith is a 70 y.o. female, who is referred for evaluation of peripheral vascular disease.  The patient states that beginning in July 2022 while on a cruise with her son she developed cramping and pain in her right calf.  She says that it now feels like her foot is a block of ice.  The right leg bothers her more than the left.  She does complain of some tingling on the right foot as well as the foot becoming more discolored.  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  She does not have any open wounds or rest pain.  Patient was started on cilostazol however this has been switched to the PAD dosing of Xarelto.  She is also on aspirin.  She takes a statin for hypercholesterolemia.  She is medically managed for hypertension.  She is a daily smoker.  PAST MEDICAL HISTORY    Past Medical History:  Diagnosis Date   ALLERGIC RHINITIS 09/01/2007   Anxiety and depression 12/24/2010   Cardiomegaly 01/17/2009   CONSTIPATION, CHRONIC 12/18/2008   COPD 04/04/2007   ELECTROCARDIOGRAM, ABNORMAL 01/17/2009   GERD 04/04/2007   HEADACHE, TENSION 04/18/2008   HEMORRHOIDS 09/25/2007   HYPERTENSION 06/12/2007   SINUSITIS- ACUTE-NOS 05/08/2010   TOBACCO ABUSE 04/04/2007   UTI 09/30/2010   Vaginitis and vulvovaginitis, unspecified 09/30/2010     FAMILY HISTORY   Family History  Problem Relation Age of Onset   Asthma Mother    Hypertension Mother    COPD Maternal Uncle    Hypertension Father    Seizures Father    Stroke Paternal Uncle    Colon cancer Neg Hx    Esophageal cancer Neg Hx    Stomach cancer Neg Hx    Rectal cancer Neg Hx     SOCIAL HISTORY:   Social History    Socioeconomic History   Marital status: Divorced    Spouse name: Not on file   Number of children: 1   Years of education: Not on file   Highest education level: Not on file  Occupational History   Occupation: custodial    Employer: [employer]  Tobacco Use   Smoking status: Every Day    Packs/day: 1.00    Years: 35.00    Pack years: 35.00    Types: Cigarettes   Smokeless tobacco: Never  Vaping Use   Vaping Use: Never used  Substance and Sexual Activity   Alcohol use: Yes    Alcohol/week: 21.0 standard drinks    Types: 21 Cans of beer per week    Comment: 4 beer per day   Drug use: No   Sexual activity: Not Currently    Partners: Male  Other Topics Concern   Not on file  Social History Narrative   Not on file   Social Determinants of Health   Financial Resource Strain: Not on file  Food Insecurity: Not on file  Transportation Needs: Not on file  Physical Activity: Not on file  Stress: Not on file  Social Connections: Not on file  Intimate Partner Violence: Not on file    ALLERGIES:    No Known Allergies  CURRENT MEDICATIONS:    Current Outpatient  Medications  Medication Sig Dispense Refill   albuterol (VENTOLIN HFA) 108 (90 Base) MCG/ACT inhaler Inhale 2 puffs into the lungs every 6 (six) hours as needed for wheezing or shortness of breath. 6.7 g 1   aspirin 81 MG EC tablet Take 1 tablet (81 mg total) by mouth daily. Swallow whole. 90 tablet 1   CVS HEARTBURN RELIEF EX ST 254-237.5 MG/5ML SUSP TAKE 5 MLS BY MOUTH 3 (THREE) TIMES DAILY AS NEEDED. 355 mL 0   Fluticasone-Umeclidin-Vilant (TRELEGY ELLIPTA) 100-62.5-25 MCG/INH AEPB Inhale 1 puff into the lungs daily. 120 each 1   nebivolol (BYSTOLIC) 5 MG tablet Take 1 tablet (5 mg total) by mouth daily. 90 tablet 1   omeprazole (PRILOSEC) 40 MG capsule TAKE 1 CAPSULE (40 MG TOTAL) BY MOUTH 2 (TWO) TIMES DAILY BEFORE A MEAL. 180 capsule 3   rivaroxaban (XARELTO) 2.5 MG TABS tablet Take 1 tablet (2.5  mg total) by mouth 2 (two) times daily. 180 tablet 0   rosuvastatin (CRESTOR) 5 MG tablet Take 1 tablet (5 mg total) by mouth daily. 90 tablet 0   triamterene-hydrochlorothiazide (DYAZIDE) 37.5-25 MG capsule TAKE 1 CAPSULE BY MOUTH EVERY DAY 90 capsule 0   No current facility-administered medications for this visit.    REVIEW OF SYSTEMS:   [X]  denotes positive finding, [ ]  denotes negative finding Cardiac  Comments:  Chest pain or chest pressure:    Shortness of breath upon exertion:    Short of breath when lying flat:    Irregular heart rhythm:        Vascular    Pain in calf, thigh, or hip brought on by ambulation: x   Pain in feet at night that wakes you up from your sleep:     Blood clot in your veins:    Leg swelling:         Pulmonary    Oxygen at home:    Productive cough:     Wheezing:         Neurologic    Sudden weakness in arms or legs:     Sudden numbness in arms or legs:     Sudden onset of difficulty speaking or slurred speech:    Temporary loss of vision in one eye:     Problems with dizziness:         Gastrointestinal    Blood in stool:      Vomited blood:         Genitourinary    Burning when urinating:     Blood in urine:        Psychiatric    Major depression:         Hematologic    Bleeding problems:    Problems with blood clotting too easily:        Skin    Rashes or ulcers:        Constitutional    Fever or chills:     PHYSICAL EXAM:   Vitals:   04/20/21 1513  BP: (!) 154/74  Pulse: (!) 53  Resp: 20  Temp: 97.9 F (36.6 C)  SpO2: 99%  Weight: 89 lb (40.4 kg)  Height: 4\' 10"  (1.473 m)    GENERAL: The patient is a well-nourished female, in no acute distress. The vital signs are documented above. CARDIAC: There is a regular rate and rhythm.  VASCULAR: 2+ femoral pulses bilaterally.  Bilateral popliteal and pedal pulses are not palpable PULMONARY: Nonlabored respirations ABDOMEN: Soft and non-tender  MUSCULOSKELETAL: There  are no major deformities or cyanosis. NEUROLOGIC: No focal weakness or paresthesias are detected. SKIN: There are no ulcers or rashes noted. PSYCHIATRIC: The patient has a normal affect.  STUDIES:   I have reviewed the following: +-------+-----------+-----------+------------+------------+  ABI/TBIToday's ABIToday's TBIPrevious ABIPrevious TBI  +-------+-----------+-----------+------------+------------+  Right  0.55       0.13                                 +-------+-----------+-----------+------------+------------+  Left   0.77       0.54                                 +-------+-----------+-----------+------------+------------+   Left toe pressure is 97 right toe pressure is 23  ASSESSMENT and PLAN   Atherosclerotic lower extremity vascular disease with claudication, right greater than left.  I told the patient that we need to focus on medical management first as she does not have limb threatening issues as of now.  She has been taken off of cilostazol which she said did give her some small relief.  She is now on peripheral dosing for Xarelto.  She just started this, so we will see how she does with this.  I would like for her to continue her baby aspirin as well as her statin.  I have also encouraged her to start a walking program and try to push herself through some of the discomfort so as to build up more collaterals.  I also told her that I would not want to perform any form of intervention until she has stopped smoking as this has a significant impact on premature occlusion of interventions.  She is going to work on these and follow-up with [place] in 3 months.  She knows to contact me if she develops a nonhealing wound prior to her next visit.   Charlena Cross, MD, FACS [facility] [phone] Pager [phone]

## 2021-04-23 ENCOUNTER — Other Ambulatory Visit: Payer: Self-pay

## 2021-04-27 ENCOUNTER — Ambulatory Visit: Payer: Medicare Other | Admitting: Internal Medicine

## 2021-05-14 ENCOUNTER — Telehealth: Payer: Self-pay | Admitting: Internal Medicine

## 2021-05-14 NOTE — Telephone Encounter (Signed)
Pt has been informed and expressed understanding. Pt stated that she would reach out to the specialist.

## 2021-05-14 NOTE — Telephone Encounter (Signed)
Patient states right foot feels like a block of ice, and painful, muscle in back of leg is throbbing again, started yesterday when it started raining  Can she take another aspirin for the pain  Patient only getting 50% blood circulation in her right leg when ultrasound performed  Please call the patient back at  [phone]

## 2021-05-15 ENCOUNTER — Telehealth: Payer: Self-pay

## 2021-05-15 NOTE — Telephone Encounter (Signed)
Patient calls today to report claudication. Says it hasn't changed from her last visit. Foot is "kinda like a block of ice." Denies ulcers or rest pain. Discussed smoking cessation and instructed her to call back sooner to her follow up if she developed any wounds or rest pain. Patient verbalizes understanding.

## 2021-06-01 ENCOUNTER — Other Ambulatory Visit: Payer: Self-pay | Admitting: Internal Medicine

## 2021-06-01 DIAGNOSIS — Z1231 Encounter for screening mammogram for malignant neoplasm of breast: Secondary | ICD-10-CM

## 2021-06-13 ENCOUNTER — Other Ambulatory Visit: Payer: Self-pay | Admitting: Internal Medicine

## 2021-07-06 ENCOUNTER — Other Ambulatory Visit: Payer: Self-pay

## 2021-07-06 ENCOUNTER — Ambulatory Visit
Admission: RE | Admit: 2021-07-06 | Discharge: 2021-07-06 | Disposition: A | Discharge status: Home / Self Care | Payer: Medicare Other | Source: Ambulatory Visit | Attending: Internal Medicine | Admitting: Internal Medicine

## 2021-07-06 DIAGNOSIS — Z1231 Encounter for screening mammogram for malignant neoplasm of breast: Secondary | ICD-10-CM

## 2021-07-08 ENCOUNTER — Ambulatory Visit
Admission: RE | Admit: 2021-07-08 | Discharge: 2021-07-08 | Disposition: A | Discharge status: Home / Self Care | Payer: Medicare Other | Source: Ambulatory Visit | Attending: Internal Medicine | Admitting: Internal Medicine

## 2021-07-08 DIAGNOSIS — Z1231 Encounter for screening mammogram for malignant neoplasm of breast: Secondary | ICD-10-CM | POA: Diagnosis not present

## 2021-07-11 ENCOUNTER — Other Ambulatory Visit: Payer: Self-pay | Admitting: Internal Medicine

## 2021-07-11 DIAGNOSIS — I779 Disorder of arteries and arterioles, unspecified: Secondary | ICD-10-CM

## 2021-07-14 ENCOUNTER — Other Ambulatory Visit: Payer: Self-pay | Admitting: Internal Medicine

## 2021-07-20 ENCOUNTER — Other Ambulatory Visit: Payer: Self-pay

## 2021-07-20 ENCOUNTER — Ambulatory Visit (INDEPENDENT_AMBULATORY_CARE_PROVIDER_SITE_OTHER): Payer: Medicare Other | Admitting: Physician Assistant

## 2021-07-20 ENCOUNTER — Other Ambulatory Visit: Payer: Self-pay | Admitting: Internal Medicine

## 2021-07-20 VITALS — BP 133/65 | HR 73 | Temp 98.0°F | Resp 20 | Ht <= 58 in | Wt 96.0 lb

## 2021-07-20 DIAGNOSIS — I739 Peripheral vascular disease, unspecified: Secondary | ICD-10-CM | POA: Diagnosis not present

## 2021-07-20 DIAGNOSIS — I10 Essential (primary) hypertension: Secondary | ICD-10-CM

## 2021-07-20 NOTE — Progress Notes (Signed)
HISTORY AND PHYSICAL     CC:  follow up. Requesting Provider:  Etta Grandchild, MD  HPI: This is a 71 y.o. female who is here today for follow up for PAD.  She was seen in September for cramping and pain in the right calf and foot feeling like ice.  The right was worse than the left. She did have some tingling in the right foot and it was becoming discolored.  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  She did not have rest pain or wounds.  Patient was started on cilostazol however this has been switched to the PAD dosing of Xarelto.  The pt did have some small relief with Cilastazol but given the Xarelto had just been started, they continued with this.  He encouraged a walking program and encouraged her to stop smoking and follow up in three months.    The pt returns today for follow up.  She states that her symptoms have improved greatly.  She states that her foot does not feel like a block of ice anymore. The cramping she was getting has gotten better.  She is still smoking but changed to  non menthol cigarettes.  She continues to taker her xarelto and asa and statin.  The pt is on a statin for cholesterol management.    The pt is on an aspirin.    Other AC:  Xarelto The pt is on BB, diuretic for hypertension.  The pt does not have diabetes. Tobacco hx:  current  Pt does not have family hx of AAA.  Past Medical History:  Diagnosis Date   ALLERGIC RHINITIS 09/01/2007   Anxiety and depression 12/24/2010   Cardiomegaly 01/17/2009   CONSTIPATION, CHRONIC 12/18/2008   COPD 04/04/2007   ELECTROCARDIOGRAM, ABNORMAL 01/17/2009   GERD 04/04/2007   HEADACHE, TENSION 04/18/2008   HEMORRHOIDS 09/25/2007   HYPERTENSION 06/12/2007   SINUSITIS- ACUTE-NOS 05/08/2010   TOBACCO ABUSE 04/04/2007   UTI 09/30/2010   Vaginitis and vulvovaginitis, unspecified 09/30/2010    Past Surgical History:  Procedure Laterality Date   AMPUTATION Right 09/23/2017   Procedure:  REVISION AMPUTATION RIGHT INDEX FINGER;  Surgeon: Betha Loa, MD;  Location: [facility];  Service: Orthopedics;  Laterality: Right;   BREAST BIOPSY Left    TONSILLECTOMY     TUBAL LIGATION     UMBILICAL HERNIA REPAIR      No Known Allergies  Current Outpatient Medications  Medication Sig Dispense Refill   albuterol (VENTOLIN HFA) 108 (90 Base) MCG/ACT inhaler Inhale 2 puffs into the lungs every 6 (six) hours as needed for wheezing or shortness of breath. 6.7 g 1   aspirin 81 MG EC tablet Take 1 tablet (81 mg total) by mouth daily. Swallow whole. 90 tablet 1   [facility] HEARTBURN RELIEF EX ST 254-237.5 MG/5ML SUSP TAKE 5 MLS BY MOUTH 3 (THREE) TIMES DAILY AS NEEDED. 355 mL 0   Fluticasone-Umeclidin-Vilant (TRELEGY ELLIPTA) 100-62.5-25 MCG/INH AEPB Inhale 1 puff into the lungs daily. 120 each 1   nebivolol (BYSTOLIC) 5 MG tablet Take 1 tablet (5 mg total) by mouth daily. 90 tablet 1   omeprazole (PRILOSEC) 40 MG capsule TAKE 1 CAPSULE (40 MG TOTAL) BY MOUTH 2 (TWO) TIMES DAILY BEFORE A MEAL. 180 capsule 3   rosuvastatin (CRESTOR) 5 MG tablet Take 1 tablet (5 mg total) by mouth daily. 90 tablet 0   triamterene-hydrochlorothiazide (DYAZIDE) 37.5-25 MG capsule TAKE 1 CAPSULE BY MOUTH EVERY DAY 90  capsule 0   XARELTO 2.5 MG TABS tablet TAKE 1 TABLET BY MOUTH TWICE A DAY 180 tablet 0   No current facility-administered medications for this visit.    Family History  Problem Relation Age of Onset   Asthma Mother    Hypertension Mother    Hypertension Father    Seizures Father    COPD Maternal Uncle    Stroke Paternal Uncle    Colon cancer Neg Hx    Esophageal cancer Neg Hx    Stomach cancer Neg Hx    Rectal cancer Neg Hx    Breast cancer Neg Hx     Social History   Socioeconomic History   Marital status: Divorced    Spouse name: Not on file   Number of children: 1   Years of education: Not on file   Highest education level: Not on file  Occupational History    Occupation: custodial    Employer: [employer]  Tobacco Use   Smoking status: Every Day    Packs/day: 1.00    Years: 35.00    Pack years: 35.00    Types: Cigarettes   Smokeless tobacco: Never  Vaping Use   Vaping Use: Never used  Substance and Sexual Activity   Alcohol use: Yes    Alcohol/week: 21.0 standard drinks    Types: 21 Cans of beer per week    Comment: 4 beer per day   Drug use: No   Sexual activity: Not Currently    Partners: Male  Other Topics Concern   Not on file  Social History Narrative   Not on file   Social Determinants of Health   Financial Resource Strain: Not on file  Food Insecurity: Not on file  Transportation Needs: Not on file  Physical Activity: Not on file  Stress: Not on file  Social Connections: Not on file  Intimate Partner Violence: Not on file     REVIEW OF SYSTEMS:   [X]  denotes positive finding, [ ]  denotes negative finding Cardiac  Comments:  Chest pain or chest pressure:    Shortness of breath upon exertion:    Short of breath when lying flat:    Irregular heart rhythm:        Vascular    Pain in calf, thigh, or hip brought on by ambulation:    Pain in feet at night that wakes you up from your sleep:     Blood clot in your veins:    Leg swelling:         Pulmonary    Oxygen at home:    Productive cough:     Wheezing:         Neurologic    Sudden weakness in arms or legs:     Sudden numbness in arms or legs:     Sudden onset of difficulty speaking or slurred speech:    Temporary loss of vision in one eye:     Problems with dizziness:         Gastrointestinal    Blood in stool:     Vomited blood:         Genitourinary    Burning when urinating:     Blood in urine:        Psychiatric    Major depression:         Hematologic    Bleeding problems:    Problems with blood clotting too easily:        Skin  Rashes or ulcers:        Constitutional    Fever or chills:      PHYSICAL  EXAMINATION:  Today's Vitals   07/20/21 0957  BP: 133/65  Pulse: 73  Resp: 20  Temp: 98 F (36.7 C)  SpO2: 95%  Weight: 96 lb (43.5 kg)  Height: 4\' 10"  (1.473 m)  PainSc: 1    Body mass index is 20.06 kg/m.   General:  WDWN in NAD; vital signs documented above Gait: Not observed HENT: WNL, normocephalic Pulmonary: normal non-labored breathing , without wheezing Cardiac: regular HR, without carotid bruits Abdomen: soft, NT, no masses; aortic pulse is not palpable Skin: without rashes Vascular Exam/Pulses:  Right Left  Radial 2+ (normal) 2+ (normal)  Femoral 2+ (normal) 2+ (normal)  Popliteal Unable to palpate Unable to palpate  DP monophasic biphasic  PT monophasic biphasic  Peroneal monophasic monophasic   Extremities: without ischemic changes, without Gangrene , without cellulitis; without open wounds;  Musculoskeletal: no muscle wasting or atrophy  Neurologic: A&O X 3 Psychiatric:  The pt has Normal affect.   Non-Invasive Vascular Imaging:   No studies today  Previous ABI's/TBI's on 03/17/2021: Right:  0.55/0.13 - Great toe pressure: 23 Left:  0.77/0.54 - Great toe pressure:  97    ASSESSMENT/PLAN:: 70 y.o. female here for follow up for PAD  PAD -pt did not have any studies today, however, her symptoms have greatly improved.  She continues taking her Xarelto and asa and statin and she will continue these. -she will continue her walking program -pt will f/u in 6 months with ABI.  Current smoker -discussed importance of smoking cessation and she is trying to quit.    73, [facility] [phone]  Clinic MD:   [phone]

## 2021-07-21 ENCOUNTER — Other Ambulatory Visit: Payer: Self-pay

## 2021-07-21 ENCOUNTER — Other Ambulatory Visit: Payer: Self-pay | Admitting: Internal Medicine

## 2021-07-21 DIAGNOSIS — E7800 Pure hypercholesterolemia, unspecified: Secondary | ICD-10-CM

## 2021-07-21 DIAGNOSIS — I739 Peripheral vascular disease, unspecified: Secondary | ICD-10-CM

## 2021-09-15 NOTE — Progress Notes (Signed)
Subjective:    Patient ID: Amber Griffith, female    DOB: 1950/10/08, 71 y.o.   MRN: [id number]  This visit occurred during the SARS-CoV-2 public health emergency.  Safety protocols were in place, including screening questions prior to the visit, additional usage of staff PPE, and extensive cleaning of exam room while observing appropriate contact time as indicated for disinfecting solutions.    HPI The patient is here for an acute visit.   ? Right eye infection - she woke up two days and and her right eye and surrounding area swollen.  She has been putting on a cold pack.  She has some pain specifically in the lower eyelid.  She denies any redness inside the eye.  There may have been a little watering specially the first day, but no other discharge.  No pain inside the eye or changes in vision.  No fevers or cold symptoms.     Medications and allergies reviewed with patient and updated if appropriate.  Patient Active Problem List   Diagnosis Date Noted   PAOD (peripheral arterial occlusive disease) (HCC) 04/13/2021   Abnormal electrocardiogram (ECG) (EKG) 01/21/2021   Esophageal dysphagia 09/25/2018   Vitamin D deficiency 08/31/2016   Cervical cancer screening 08/30/2016   Encounter for screening for lung cancer 08/30/2016   Compression fracture of lumbar spine, non-traumatic (HCC) 06/23/2015   Osteoporosis 06/23/2015   Atopic dermatitis 11/10/2012   Pure hypercholesterolemia 03/01/2012   Hemorrhoids without complication 03/01/2012   Anxiety and depression 12/24/2010   Insomnia 12/24/2010   Routine general medical examination at a health care facility 12/24/2010   Cardiomegaly 01/17/2009   CONSTIPATION, CHRONIC 12/18/2008   ALLERGIC RHINITIS 09/01/2007   Essential hypertension 06/12/2007   TOBACCO ABUSE 04/04/2007   COPD (chronic obstructive pulmonary disease) with chronic bronchitis (HCC) 04/04/2007   GERD 04/04/2007    Current Outpatient Medications on File  Prior to Visit  Medication Sig Dispense Refill   albuterol (VENTOLIN HFA) 108 (90 Base) MCG/ACT inhaler Inhale 2 puffs into the lungs every 6 (six) hours as needed for wheezing or shortness of breath. 6.7 g 1   aspirin 81 MG EC tablet Take 1 tablet (81 mg total) by mouth daily. Swallow whole. 90 tablet 1   [facility] HEARTBURN RELIEF EX ST 254-237.5 MG/5ML SUSP TAKE 5 MLS BY MOUTH 3 (THREE) TIMES DAILY AS NEEDED. 355 mL 0   Fluticasone-Umeclidin-Vilant (TRELEGY ELLIPTA) 100-62.5-25 MCG/INH AEPB Inhale 1 puff into the lungs daily. 120 each 1   nebivolol (BYSTOLIC) 5 MG tablet Take 1 tablet (5 mg total) by mouth daily. 90 tablet 1   omeprazole (PRILOSEC) 40 MG capsule TAKE 1 CAPSULE (40 MG TOTAL) BY MOUTH 2 (TWO) TIMES DAILY BEFORE A MEAL. 180 capsule 3   rosuvastatin (CRESTOR) 5 MG tablet TAKE 1 TABLET (5 MG TOTAL) BY MOUTH DAILY. 90 tablet 0   triamterene-hydrochlorothiazide (DYAZIDE) 37.5-25 MG capsule TAKE 1 CAPSULE BY MOUTH EVERY DAY 90 capsule 0   XARELTO 2.5 MG TABS tablet TAKE 1 TABLET BY MOUTH TWICE A DAY 180 tablet 0   No current facility-administered medications on file prior to visit.    Past Medical History:  Diagnosis Date   ALLERGIC RHINITIS 09/01/2007   Anxiety and depression 12/24/2010   Cardiomegaly 01/17/2009   CONSTIPATION, CHRONIC 12/18/2008   COPD 04/04/2007   ELECTROCARDIOGRAM, ABNORMAL 01/17/2009   GERD 04/04/2007   HEADACHE, TENSION 04/18/2008   HEMORRHOIDS 09/25/2007   HYPERTENSION 06/12/2007   SINUSITIS- ACUTE-NOS 05/08/2010   TOBACCO  ABUSE 04/04/2007   UTI 09/30/2010   Vaginitis and vulvovaginitis, unspecified 09/30/2010    Past Surgical History:  Procedure Laterality Date   AMPUTATION Right 09/23/2017   Procedure: REVISION AMPUTATION RIGHT INDEX FINGER;  Surgeon: Betha LoaKuzma, Kevin, MD;  [facility];  Service: Orthopedics;  Laterality: Right;   BREAST BIOPSY Left    TONSILLECTOMY     TUBAL LIGATION     UMBILICAL HERNIA REPAIR      Social History    Socioeconomic History   Marital status: Divorced    Spouse name: Not on file   Number of children: 1   Years of education: Not on file   Highest education level: Not on file  Occupational History   Occupation: custodial    Employer: [employer]  Tobacco Use   Smoking status: Every Day    Packs/day: 1.00    Years: 35.00    Pack years: 35.00    Types: Cigarettes   Smokeless tobacco: Never  Vaping Use   Vaping Use: Never used  Substance and Sexual Activity   Alcohol use: Yes    Alcohol/week: 21.0 standard drinks    Types: 21 Cans of beer per week    Comment: 4 beer per day   Drug use: No   Sexual activity: Not Currently    Partners: Male  Other Topics Concern   Not on file  Social History Narrative   Not on file   Social Determinants of Health   Financial Resource Strain: Not on file  Food Insecurity: Not on file  Transportation Needs: Not on file  Physical Activity: Not on file  Stress: Not on file  Social Connections: Not on file    Family History  Problem Relation Age of Onset   Asthma Mother    Hypertension Mother    Hypertension Father    Seizures Father    COPD Maternal Uncle    Stroke Paternal Uncle    Colon cancer Neg Hx    Esophageal cancer Neg Hx    Stomach cancer Neg Hx    Rectal cancer Neg Hx    Breast cancer Neg Hx     Review of Systems  Constitutional:  Negative for fever.  Eyes:  Positive for discharge (watery two days ago) and itching (lower eyelid). Negative for pain and redness.       Lower eye lid painful      Objective:   Vitals:   09/16/21 1545  BP: 138/80  Pulse: (!) 58  Temp: 98.7 F (37.1 C)  SpO2: 96%   BP Readings from Last 3 Encounters:  09/16/21 138/80  07/20/21 133/65  04/20/21 (!) 154/74   Wt Readings from Last 3 Encounters:  09/16/21 94 lb 3.2 oz (42.7 kg)  07/20/21 96 lb (43.5 kg)  04/20/21 89 lb (40.4 kg)   Body mass index is 19.69 kg/m.   Physical Exam Constitutional:      General: She  is not in acute distress.    Appearance: Normal appearance. She is not ill-appearing.  HENT:     Head: Normocephalic.  Eyes:     General:        Right eye: No discharge.        Left eye: No discharge.     Extraocular Movements: Extraocular movements intact.     Conjunctiva/sclera: Conjunctivae normal.     Comments: Mild lateral upper and lower eyelid swelling, focal area of swelling medial right lower eyelid that is tender and  is consistent with a stye-small bump on top of the lower eyelid without active discharge  Neurological:     Mental Status: She is alert.           Assessment & Plan:    Stye: Acute Exam consistent with stye of right medial lower eyelid Start warm compresses Erythromycin ointment at bedtime Advised that there is no improvement she should see an eye doctor Call with any questions or concerns

## 2021-09-16 ENCOUNTER — Other Ambulatory Visit: Payer: Self-pay

## 2021-09-16 ENCOUNTER — Ambulatory Visit (INDEPENDENT_AMBULATORY_CARE_PROVIDER_SITE_OTHER): Payer: Medicare Other | Admitting: Internal Medicine

## 2021-09-16 ENCOUNTER — Encounter: Payer: Self-pay | Admitting: Internal Medicine

## 2021-09-16 VITALS — BP 138/80 | HR 58 | Temp 98.7°F | Ht <= 58 in | Wt 94.2 lb

## 2021-09-16 DIAGNOSIS — H00012 Hordeolum externum right lower eyelid: Secondary | ICD-10-CM | POA: Diagnosis not present

## 2021-09-16 MED ORDER — ERYTHROMYCIN 5 MG/GM OP OINT: 1.0000 "application " | TOPICAL_OINTMENT | Freq: Every day | OPHTHALMIC | 0 refills | Status: DC

## 2021-09-16 NOTE — Patient Instructions (Addendum)
° ° ° °  Start applying warm compresses on your eyelid.    Apply the prescribed ointment to your eyelid at night.

## 2021-10-10 ENCOUNTER — Other Ambulatory Visit: Payer: Self-pay | Admitting: Internal Medicine

## 2021-10-10 DIAGNOSIS — I779 Disorder of arteries and arterioles, unspecified: Secondary | ICD-10-CM

## 2021-10-18 ENCOUNTER — Other Ambulatory Visit: Payer: Self-pay | Admitting: Internal Medicine

## 2021-10-18 DIAGNOSIS — I10 Essential (primary) hypertension: Secondary | ICD-10-CM

## 2021-11-06 ENCOUNTER — Other Ambulatory Visit: Payer: Self-pay | Admitting: Internal Medicine

## 2021-11-06 DIAGNOSIS — I10 Essential (primary) hypertension: Secondary | ICD-10-CM

## 2021-11-12 ENCOUNTER — Encounter: Payer: Self-pay | Admitting: Internal Medicine

## 2021-11-12 ENCOUNTER — Ambulatory Visit (INDEPENDENT_AMBULATORY_CARE_PROVIDER_SITE_OTHER): Payer: Medicare Other | Admitting: Internal Medicine

## 2021-11-12 DIAGNOSIS — I779 Disorder of arteries and arterioles, unspecified: Secondary | ICD-10-CM

## 2021-11-12 DIAGNOSIS — I10 Essential (primary) hypertension: Secondary | ICD-10-CM | POA: Diagnosis not present

## 2021-11-12 DIAGNOSIS — E7800 Pure hypercholesterolemia, unspecified: Secondary | ICD-10-CM

## 2021-11-12 LAB — CBC WITH DIFFERENTIAL/PLATELET
Basophils Absolute: 0.1 10*3/uL (ref 0.0–0.1)
Basophils Relative: 0.9 % (ref 0.0–3.0)
Eosinophils Absolute: 0.1 10*3/uL (ref 0.0–0.7)
Eosinophils Relative: 1.1 % (ref 0.0–5.0)
HCT: 40.6 % (ref 36.0–46.0)
Hemoglobin: 13.2 g/dL (ref 12.0–15.0)
Lymphocytes Relative: 40.1 % (ref 12.0–46.0)
Lymphs Abs: 2.8 10*3/uL (ref 0.7–4.0)
MCHC: 32.5 g/dL (ref 30.0–36.0)
MCV: 91.5 fl (ref 78.0–100.0)
Monocytes Absolute: 0.9 10*3/uL (ref 0.1–1.0)
Monocytes Relative: 12.6 % — ABNORMAL HIGH (ref 3.0–12.0)
Neutro Abs: 3.2 10*3/uL (ref 1.4–7.7)
Neutrophils Relative %: 45.3 % (ref 43.0–77.0)
Platelets: 231 10*3/uL (ref 150.0–400.0)
RBC: 4.43 Mil/uL (ref 3.87–5.11)
RDW: 13.1 % (ref 11.5–15.5)
WBC: 7 10*3/uL (ref 4.0–10.5)

## 2021-11-12 LAB — BASIC METABOLIC PANEL
BUN: 12 mg/dL (ref 6–23)
CO2: 30 mEq/L (ref 19–32)
Calcium: 9.6 mg/dL (ref 8.4–10.5)
Chloride: 101 mEq/L (ref 96–112)
Creatinine, Ser: 0.73 mg/dL (ref 0.40–1.20)
GFR: 84 mL/min (ref >60.00)
Glucose, Bld: 87 mg/dL (ref 70–99)
Potassium: 3.8 mEq/L (ref 3.5–5.1)
Sodium: 138 mEq/L (ref 135–145)

## 2021-11-12 MED ORDER — TRIAMTERENE-HCTZ 37.5-25 MG PO CAPS
ORAL_CAPSULE | ORAL | 1 refills | Status: DC
Start: 2021-11-12 — End: 2022-05-14

## 2021-11-12 MED ORDER — XARELTO 2.5 MG PO TABS: 2.5000 mg | ORAL_TABLET | Freq: Two times a day (BID) | ORAL | 1 refills | Status: DC

## 2021-11-12 MED ORDER — ROSUVASTATIN CALCIUM 5 MG PO TABS: 5.0000 mg | ORAL_TABLET | Freq: Every day | ORAL | 1 refills | Status: DC

## 2021-11-12 MED ORDER — NEBIVOLOL HCL 5 MG PO TABS: 5.0000 mg | ORAL_TABLET | Freq: Every day | ORAL | 1 refills | Status: DC

## 2021-11-12 NOTE — Progress Notes (Signed)
? ?Subjective:  ?Patient ID: Amber Griffith, female    DOB: 1951/06/13  Age: 71 y.o. MRN: [id number] ? ?CC: Hypertension and Hyperlipidemia ? ? ?HPI ?Amber Griffith presents for f/up - ? ?She has been taking one of her antihypertensives but not both.  She denies headache, blurred vision, chest pain, shortness of breath, dizziness, lightheadedness, or claudication. ? ?Outpatient Medications Prior to Visit  ?Medication Sig Dispense Refill  ? albuterol (VENTOLIN HFA) 108 (90 Base) MCG/ACT inhaler Inhale 2 puffs into the lungs every 6 (six) hours as needed for wheezing or shortness of breath. 6.7 g 1  ? ASPIRIN LOW DOSE 81 MG EC tablet TAKE 1 TABLET (81 MG TOTAL) BY MOUTH DAILY. SWALLOW WHOLE. 90 tablet 1  ? CVS HEARTBURN RELIEF EX ST 254-237.5 MG/5ML SUSP TAKE 5 MLS BY MOUTH 3 (THREE) TIMES DAILY AS NEEDED. 355 mL 0  ? erythromycin ophthalmic ointment Place 1 application into the right eye at bedtime. 3.5 g 0  ? Fluticasone-Umeclidin-Vilant (TRELEGY ELLIPTA) 100-62.5-25 MCG/INH AEPB Inhale 1 puff into the lungs daily. 120 each 1  ? omeprazole (PRILOSEC) 40 MG capsule TAKE 1 CAPSULE (40 MG TOTAL) BY MOUTH 2 (TWO) TIMES DAILY BEFORE A MEAL. 180 capsule 3  ? nebivolol (BYSTOLIC) 5 MG tablet Take 1 tablet (5 mg total) by mouth daily. 90 tablet 1  ? rosuvastatin (CRESTOR) 5 MG tablet TAKE 1 TABLET (5 MG TOTAL) BY MOUTH DAILY. 90 tablet 0  ? triamterene-hydrochlorothiazide (DYAZIDE) 37.5-25 MG capsule TAKE 1 CAPSULE BY MOUTH EVERY DAY 90 capsule 0  ? XARELTO 2.5 MG TABS tablet TAKE 1 TABLET BY MOUTH TWICE A DAY 180 tablet 0  ? ?No facility-administered medications prior to visit.  ? ? ?ROS ?Review of Systems  ?Constitutional: Negative.  Negative for diaphoresis and fatigue.  ?HENT: Negative.    ?Eyes: Negative.   ?Respiratory:  Negative for cough, chest tightness and wheezing.   ?Cardiovascular:  Negative for chest pain, palpitations and leg swelling.  ?Gastrointestinal:  Negative for abdominal pain, constipation,  diarrhea, nausea and vomiting.  ?Endocrine: Negative.   ?Genitourinary: Negative.   ?Musculoskeletal: Negative.  Negative for myalgias.  ?Skin: Negative.  Negative for color change and pallor.  ?Neurological:  Negative for dizziness, weakness and headaches.  ?Hematological:  Negative for adenopathy. Does not bruise/bleed easily.  ?Psychiatric/Behavioral: Negative.    ? ?Objective:  ?BP (!) 158/72 (BP Location: Left Arm, Patient Position: Sitting)   Pulse 63   Temp 98.2 ?F (36.8 ?C) (Oral)   Resp 16   Ht 4\' 10"  (1.473 m)   Wt 97 lb (44 kg)   SpO2 97%   BMI 20.27 kg/m?  ? ?BP Readings from Last 3 Encounters:  ?11/12/21 (!) 158/72  ?09/16/21 138/80  ?07/20/21 133/65  ? ? ?Wt Readings from Last 3 Encounters:  ?11/12/21 97 lb (44 kg)  ?09/16/21 94 lb 3.2 oz (42.7 kg)  ?07/20/21 96 lb (43.5 kg)  ? ? ?Physical Exam ?Vitals reviewed.  ?Constitutional:   ?   Appearance: Normal appearance.  ?HENT:  ?   Nose: Nose normal.  ?   Mouth/Throat:  ?   Mouth: Mucous membranes are moist.  ?Eyes:  ?   General: No scleral icterus. ?   Conjunctiva/sclera: Conjunctivae normal.  ?Cardiovascular:  ?   Rate and Rhythm: Normal rate and regular rhythm.  ?   Pulses:     ?     Dorsalis pedis pulses are 1+ on the right side and 1+ on the left side.  ?  Posterior tibial pulses are 1+ on the right side and 1+ on the left side.  ?   Heart sounds: No murmur heard. ?Pulmonary:  ?   Effort: Pulmonary effort is normal.  ?   Breath sounds: No stridor. No wheezing, rhonchi or rales.  ?Abdominal:  ?   General: Abdomen is flat.  ?   Palpations: There is no mass.  ?   Tenderness: There is no abdominal tenderness. There is no guarding.  ?   Hernia: No hernia is present.  ?Musculoskeletal:  ?   Cervical back: Neck supple.  ?Feet:  ?   Right foot:  ?   Skin integrity: Skin integrity normal.  ?   Left foot:  ?   Skin integrity: Skin integrity normal.  ?Lymphadenopathy:  ?   Cervical: No cervical adenopathy.  ?Skin: ?   General: Skin is warm and dry.   ?Neurological:  ?   General: No focal deficit present.  ?   Mental Status: She is alert.  ?Psychiatric:     ?   Mood and Affect: Mood normal.     ?   Behavior: Behavior normal.  ? ? ?Lab Results  ?Component Value Date  ? WBC 7.0 11/12/2021  ? HGB 13.2 11/12/2021  ? HCT 40.6 11/12/2021  ? PLT 231.0 11/12/2021  ? GLUCOSE 87 11/12/2021  ? CHOL 179 01/21/2021  ? TRIG 117.0 01/21/2021  ? HDL 62.10 01/21/2021  ? LDLDIRECT 84.0 01/25/2018  ? LDLCALC 94 01/21/2021  ? ALT 13 03/13/2021  ? AST 15 03/13/2021  ? NA 138 11/12/2021  ? K 3.8 11/12/2021  ? CL 101 11/12/2021  ? CREATININE 0.73 11/12/2021  ? BUN 12 11/12/2021  ? CO2 30 11/12/2021  ? TSH 2.23 01/21/2021  ? HGBA1C 4.9 07/30/2013  ? ? ?MM 3D SCREEN BREAST BILATERAL ? ?Result Date: 07/09/2021 ?CLINICAL DATA:  Screening. EXAM: DIGITAL SCREENING BILATERAL MAMMOGRAM WITH TOMOSYNTHESIS AND CAD TECHNIQUE: Bilateral screening digital craniocaudal and mediolateral oblique mammograms were obtained. Bilateral screening digital breast tomosynthesis was performed. The images were evaluated with computer-aided detection. COMPARISON:  Previous exam(s). ACR Breast Density Category c: The breast tissue is heterogeneously dense, which may obscure small masses. FINDINGS: There are no findings suspicious for malignancy. IMPRESSION: No mammographic evidence of malignancy. A result letter of this screening mammogram will be mailed directly to the patient. RECOMMENDATION: Screening mammogram in one year. (Code:SM-B-01Y) BI-RADS CATEGORY  1: Negative. Electronically Signed   By: Annia Beltrew  Davis M.D.   On: 07/09/2021 08:16  ? ? ?Assessment & Plan:  ? ?Amber Griffith was seen today for hypertension and hyperlipidemia. ? ?Diagnoses and all orders for this visit: ? ?Pure hypercholesterolemia- LDL goal achieved. Doing well on the statin  ?-     rosuvastatin (CRESTOR) 5 MG tablet; Take 1 tablet (5 mg total) by mouth daily. ? ?Essential hypertension- Her blood pressure is not adequately well controlled.  I  encouraged her to take both antihypertensives as directed. ?-     nebivolol (BYSTOLIC) 5 MG tablet; Take 1 tablet (5 mg total) by mouth daily. ?-     triamterene-hydrochlorothiazide (DYAZIDE) 37.5-25 MG capsule; TAKE 1 CAPSULE BY MOUTH EVERY DAY ?-     Basic metabolic panel; Future ?-     CBC with Differential/Platelet; Future ?-     CBC with Differential/Platelet ?-     Basic metabolic panel ? ?PAOD (peripheral arterial occlusive disease) (HCC)- Improvement noted since she started taking the DOAC. ?-     rivaroxaban (XARELTO) 2.5  MG TABS tablet; Take 1 tablet (2.5 mg total) by mouth 2 (two) times daily. ? ? ?I have changed Cadance L. Angello's Xarelto. I am also having her maintain her albuterol, CVS Heartburn Relief Ex St, omeprazole, Trelegy Ellipta, erythromycin, Aspirin Low Dose, rosuvastatin, nebivolol, and triamterene-hydrochlorothiazide. ? ?Meds ordered this encounter  ?Medications  ? rosuvastatin (CRESTOR) 5 MG tablet  ?  Sig: Take 1 tablet (5 mg total) by mouth daily.  ?  Dispense:  90 tablet  ?  Refill:  1  ? nebivolol (BYSTOLIC) 5 MG tablet  ?  Sig: Take 1 tablet (5 mg total) by mouth daily.  ?  Dispense:  90 tablet  ?  Refill:  1  ? rivaroxaban (XARELTO) 2.5 MG TABS tablet  ?  Sig: Take 1 tablet (2.5 mg total) by mouth 2 (two) times daily.  ?  Dispense:  180 tablet  ?  Refill:  1  ? triamterene-hydrochlorothiazide (DYAZIDE) 37.5-25 MG capsule  ?  Sig: TAKE 1 CAPSULE BY MOUTH EVERY DAY  ?  Dispense:  90 capsule  ?  Refill:  1  ? ? ? ?Follow-up: Return in about 6 months (around 05/14/2022). ? ?Sanda Linger, MD ?

## 2021-11-12 NOTE — Patient Instructions (Signed)

## 2022-05-14 ENCOUNTER — Other Ambulatory Visit: Payer: Self-pay | Admitting: Internal Medicine

## 2022-05-14 DIAGNOSIS — E7800 Pure hypercholesterolemia, unspecified: Secondary | ICD-10-CM

## 2022-05-14 DIAGNOSIS — I10 Essential (primary) hypertension: Secondary | ICD-10-CM

## 2022-05-14 DIAGNOSIS — I779 Disorder of arteries and arterioles, unspecified: Secondary | ICD-10-CM

## 2022-05-17 ENCOUNTER — Ambulatory Visit: Payer: BC Managed Care – PPO | Admitting: Internal Medicine

## 2022-05-18 ENCOUNTER — Ambulatory Visit (INDEPENDENT_AMBULATORY_CARE_PROVIDER_SITE_OTHER): Payer: Medicare Other | Admitting: Internal Medicine

## 2022-05-18 ENCOUNTER — Encounter: Payer: Self-pay | Admitting: Internal Medicine

## 2022-05-18 VITALS — BP 144/68 | HR 61 | Temp 97.6°F | Resp 16 | Ht <= 58 in | Wt 94.0 lb

## 2022-05-18 DIAGNOSIS — E7800 Pure hypercholesterolemia, unspecified: Secondary | ICD-10-CM | POA: Diagnosis not present

## 2022-05-18 DIAGNOSIS — F17200 Nicotine dependence, unspecified, uncomplicated: Secondary | ICD-10-CM

## 2022-05-18 DIAGNOSIS — R3129 Other microscopic hematuria: Secondary | ICD-10-CM | POA: Diagnosis not present

## 2022-05-18 DIAGNOSIS — I779 Disorder of arteries and arterioles, unspecified: Secondary | ICD-10-CM

## 2022-05-18 DIAGNOSIS — Z0001 Encounter for general adult medical examination with abnormal findings: Secondary | ICD-10-CM

## 2022-05-18 DIAGNOSIS — J4489 Other specified chronic obstructive pulmonary disease: Secondary | ICD-10-CM

## 2022-05-18 DIAGNOSIS — I10 Essential (primary) hypertension: Secondary | ICD-10-CM

## 2022-05-18 DIAGNOSIS — K2100 Gastro-esophageal reflux disease with esophagitis, without bleeding: Secondary | ICD-10-CM | POA: Diagnosis not present

## 2022-05-18 LAB — LIPID PANEL
Cholesterol: 159 mg/dL (ref 0–200)
HDL: 47.7 mg/dL (ref >39.00)
LDL Cholesterol: 73 mg/dL (ref 0–99)
NonHDL: 111.04
Total CHOL/HDL Ratio: 3
Triglycerides: 192 mg/dL — ABNORMAL HIGH (ref 0.0–149.0)
VLDL: 38.4 mg/dL (ref 0.0–40.0)

## 2022-05-18 LAB — URINALYSIS, ROUTINE W REFLEX MICROSCOPIC
Bilirubin Urine: NEGATIVE
Ketones, ur: NEGATIVE
Leukocytes,Ua: NEGATIVE
Nitrite: NEGATIVE
Specific Gravity, Urine: 1.015 (ref 1.000–1.030)
Total Protein, Urine: NEGATIVE
Urine Glucose: NEGATIVE
Urobilinogen, UA: 0.2 (ref 0.0–1.0)
pH: 6.5 (ref 5.0–8.0)

## 2022-05-18 LAB — CBC WITH DIFFERENTIAL/PLATELET
Basophils Absolute: 0.1 10*3/uL (ref 0.0–0.1)
Basophils Relative: 1.3 % (ref 0.0–3.0)
Eosinophils Absolute: 0.1 10*3/uL (ref 0.0–0.7)
Eosinophils Relative: 1.5 % (ref 0.0–5.0)
HCT: 40.9 % (ref 36.0–46.0)
Hemoglobin: 13.4 g/dL (ref 12.0–15.0)
Lymphocytes Relative: 38.6 % (ref 12.0–46.0)
Lymphs Abs: 3.1 10*3/uL (ref 0.7–4.0)
MCHC: 32.7 g/dL (ref 30.0–36.0)
MCV: 91.3 fl (ref 78.0–100.0)
Monocytes Absolute: 0.9 10*3/uL (ref 0.1–1.0)
Monocytes Relative: 11.2 % (ref 3.0–12.0)
Neutro Abs: 3.8 10*3/uL (ref 1.4–7.7)
Neutrophils Relative %: 47.4 % (ref 43.0–77.0)
Platelets: 248 10*3/uL (ref 150.0–400.0)
RBC: 4.48 Mil/uL (ref 3.87–5.11)
RDW: 12.8 % (ref 11.5–15.5)
WBC: 8 10*3/uL (ref 4.0–10.5)

## 2022-05-18 LAB — HEPATIC FUNCTION PANEL
ALT: 11 U/L (ref 0–35)
AST: 14 U/L (ref 0–37)
Albumin: 4.5 g/dL (ref 3.5–5.2)
Alkaline Phosphatase: 61 U/L (ref 39–117)
Bilirubin, Direct: 0.1 mg/dL (ref 0.0–0.3)
Total Bilirubin: 0.6 mg/dL (ref 0.2–1.2)
Total Protein: 7.2 g/dL (ref 6.0–8.3)

## 2022-05-18 LAB — BASIC METABOLIC PANEL
BUN: 17 mg/dL (ref 6–23)
CO2: 28 mEq/L (ref 19–32)
Calcium: 9.6 mg/dL (ref 8.4–10.5)
Chloride: 104 mEq/L (ref 96–112)
Creatinine, Ser: 0.85 mg/dL (ref 0.40–1.20)
GFR: 69.72 mL/min (ref >60.00)
Glucose, Bld: 97 mg/dL (ref 70–99)
Potassium: 3.6 mEq/L (ref 3.5–5.1)
Sodium: 140 mEq/L (ref 135–145)

## 2022-05-18 LAB — TSH: TSH: 1.13 u[IU]/mL (ref 0.35–5.50)

## 2022-05-18 MED ORDER — ASPIRIN 81 MG PO TBEC: 81.0000 mg | DELAYED_RELEASE_TABLET | Freq: Every day | ORAL | 1 refills | Status: DC

## 2022-05-18 MED ORDER — TRELEGY ELLIPTA 100-62.5-25 MCG/ACT IN AEPB: 1.0000 | INHALATION_SPRAY | Freq: Every day | RESPIRATORY_TRACT | 1 refills | Status: DC

## 2022-05-18 NOTE — Patient Instructions (Signed)

## 2022-05-18 NOTE — Progress Notes (Unsigned)
Subjective:  Patient ID: Amber Griffith, female    DOB: 1950-08-13  Age: 71 y.o. MRN: [id number]  CC: Annual Exam, Hypertension, COPD, and Hyperlipidemia   HPI Amber Griffith presents for a CPX and f/up -   She complains of a chronic cough that is intermittently productive of white phlegm.  She denies chest pain, wheezing, or hemoptysis.  Outpatient Medications Prior to Visit  Medication Sig Dispense Refill   albuterol (VENTOLIN HFA) 108 (90 Base) MCG/ACT inhaler Inhale 2 puffs into the lungs every 6 (six) hours as needed for wheezing or shortness of breath. 6.7 g 1   [facility] HEARTBURN RELIEF EX ST 254-237.5 MG/5ML SUSP TAKE 5 MLS BY MOUTH 3 (THREE) TIMES DAILY AS NEEDED. 355 mL 0   nebivolol (BYSTOLIC) 5 MG tablet TAKE 1 TABLET (5 MG TOTAL) BY MOUTH DAILY. 90 tablet 1   rosuvastatin (CRESTOR) 5 MG tablet TAKE 1 TABLET (5 MG TOTAL) BY MOUTH DAILY. 90 tablet 1   triamterene-hydrochlorothiazide (DYAZIDE) 37.5-25 MG capsule TAKE 1 CAPSULE BY MOUTH EVERY DAY 90 capsule 1   XARELTO 2.5 MG TABS tablet TAKE 1 TABLET BY MOUTH TWICE A DAY 180 tablet 1   ASPIRIN LOW DOSE 81 MG EC tablet TAKE 1 TABLET (81 MG TOTAL) BY MOUTH DAILY. SWALLOW WHOLE. 90 tablet 1   erythromycin ophthalmic ointment Place 1 application into the right eye at bedtime. 3.5 g 0   Fluticasone-Umeclidin-Vilant (TRELEGY ELLIPTA) 100-62.5-25 MCG/INH AEPB Inhale 1 puff into the lungs daily. 120 each 1   omeprazole (PRILOSEC) 40 MG capsule TAKE 1 CAPSULE (40 MG TOTAL) BY MOUTH 2 (TWO) TIMES DAILY BEFORE A MEAL. 180 capsule 3   No facility-administered medications prior to visit.    ROS Review of Systems  Constitutional:  Negative for chills, diaphoresis, fatigue and fever.  HENT: Negative.    Eyes: Negative.   Respiratory:  Positive for cough and shortness of breath. Negative for chest tightness and wheezing.   Cardiovascular:  Negative for chest pain, palpitations and leg swelling.  Gastrointestinal:  Negative for  abdominal pain, constipation, diarrhea, nausea and vomiting.  Endocrine: Negative.   Genitourinary: Negative.  Negative for difficulty urinating, dysuria and hematuria.  Musculoskeletal: Negative.  Negative for myalgias.  Skin: Negative.   Allergic/Immunologic: Negative.   Neurological: Negative.  Negative for dizziness, weakness and light-headedness.  Hematological:  Negative for adenopathy. Does not bruise/bleed easily.  Psychiatric/Behavioral: Negative.      Objective:  BP (!) 144/68 (BP Location: Left Arm, Patient Position: Sitting, Cuff Size: Normal)   Pulse 61   Temp 97.6 F (36.4 C) (Oral)   Resp 16   Ht 4\' 10"  (1.473 m)   Wt 94 lb (42.6 kg)   SpO2 96%   BMI 19.65 kg/m   BP Readings from Last 3 Encounters:  05/18/22 (!) 144/68  11/12/21 (!) 158/72  09/16/21 138/80    Wt Readings from Last 3 Encounters:  05/18/22 94 lb (42.6 kg)  11/12/21 97 lb (44 kg)  09/16/21 94 lb 3.2 oz (42.7 kg)    Physical Exam Vitals reviewed.  Constitutional:      Appearance: She is not ill-appearing.  HENT:     Nose: Nose normal.     Mouth/Throat:     Mouth: Mucous membranes are moist.  Eyes:     General: No scleral icterus.    Conjunctiva/sclera: Conjunctivae normal.  Cardiovascular:     Rate and Rhythm: Regular rhythm. Bradycardia present.     Pulses:  Dorsalis pedis pulses are 1+ on the right side and 1+ on the left side.       Posterior tibial pulses are 1+ on the right side and 1+ on the left side.     Heart sounds: Normal heart sounds, S1 normal and S2 normal. No murmur heard.    No friction rub. No gallop.     Comments: EKG- SB, 52 bpm TWI in anterior leads is old No LVH or Q waves Pulmonary:     Effort: Pulmonary effort is normal.     Breath sounds: No stridor. No wheezing, rhonchi or rales.  Abdominal:     General: Abdomen is flat.     Palpations: There is no mass.     Tenderness: There is no abdominal tenderness. There is no guarding.     Hernia: No  hernia is present.  Musculoskeletal:     Cervical back: Neck supple.     Right lower leg: No edema.     Left lower leg: No edema.  Feet:     Right foot:     Skin integrity: Skin integrity normal.     Toenail Condition: Right toenails are normal.     Left foot:     Skin integrity: Skin integrity normal.     Toenail Condition: Left toenails are normal.  Skin:    General: Skin is warm and dry.  Neurological:     General: No focal deficit present.     Mental Status: She is alert.  Psychiatric:        Mood and Affect: Mood normal.        Behavior: Behavior normal.     Lab Results  Component Value Date   WBC 8.0 05/18/2022   HGB 13.4 05/18/2022   HCT 40.9 05/18/2022   PLT 248.0 05/18/2022   GLUCOSE 97 05/18/2022   CHOL 159 05/18/2022   TRIG 192.0 (H) 05/18/2022   HDL 47.70 05/18/2022   LDLDIRECT 84.0 01/25/2018   LDLCALC 73 05/18/2022   ALT 11 05/18/2022   AST 14 05/18/2022   NA 140 05/18/2022   K 3.6 05/18/2022   CL 104 05/18/2022   CREATININE 0.85 05/18/2022   BUN 17 05/18/2022   CO2 28 05/18/2022   TSH 1.13 05/18/2022   HGBA1C 4.9 07/30/2013    MM 3D SCREEN BREAST BILATERAL  Result Date: 07/09/2021 CLINICAL DATA:  Screening. EXAM: DIGITAL SCREENING BILATERAL MAMMOGRAM WITH TOMOSYNTHESIS AND CAD TECHNIQUE: Bilateral screening digital craniocaudal and mediolateral oblique mammograms were obtained. Bilateral screening digital breast tomosynthesis was performed. The images were evaluated with computer-aided detection. COMPARISON:  Previous exam(s). ACR Breast Density Category c: The breast tissue is heterogeneously dense, which may obscure small masses. FINDINGS: There are no findings suspicious for malignancy. IMPRESSION: No mammographic evidence of malignancy. A result letter of this screening mammogram will be mailed directly to the patient. RECOMMENDATION: Screening mammogram in one year. (Code:SM-B-01Y) BI-RADS CATEGORY  1: Negative. Electronically Signed   By: Lovey Newcomer M.D.   On: 07/09/2021 08:16    Assessment & Plan:   Amber Griffith was seen today for annual exam, hypertension, copd and hyperlipidemia.  Diagnoses and all orders for this visit:  Essential hypertension- Her BP is well controlled. -     Basic metabolic panel; Future -     TSH; Future -     Hepatic function panel; Future -     CBC with Differential/Platelet; Future -     EKG 12-Lead -     Urinalysis,  Routine w reflex microscopic; Future -     Urinalysis, Routine w reflex microscopic -     CBC with Differential/Platelet -     Hepatic function panel -     TSH -     Basic metabolic panel  COPD (chronic obstructive pulmonary disease) with chronic bronchitis -     CBC with Differential/Platelet; Future -     CBC with Differential/Platelet -     Fluticasone-Umeclidin-Vilant (TRELEGY ELLIPTA) 100-62.5-25 MCG/ACT AEPB; Inhale 1 puff into the lungs daily.  Gastroesophageal reflux disease with esophagitis without hemorrhage -     CBC with Differential/Platelet; Future -     CBC with Differential/Platelet  Pure hypercholesterolemia- LDL goal achieved. Doing well on the statin  -     Lipid panel; Future -     TSH; Future -     Hepatic function panel; Future -     Hepatic function panel -     TSH -     Lipid panel  TOBACCO ABUSE -     Ambulatory Referral for Lung Cancer Scre  Encounter for general adult medical examination with abnormal findings-  Exam completed, labs reviewed, vaccines reviewed and updated, cancer screenings are UTD, pt ed material was given.  PAOD (peripheral arterial occlusive disease) (HCC) -     aspirin EC (ASPIRIN LOW DOSE) 81 MG tablet; Take 1 tablet (81 mg total) by mouth 5 (five) times daily. SWALLOW WHOLE.  Other microscopic hematuria- Will recheck in 3 months.   I have discontinued Cecylia L. Pesantez's Trelegy Ellipta and erythromycin. I have also changed her Aspirin Low Dose to aspirin EC. Additionally, I am having her start on Trelegy Ellipta. Lastly,  I am having her maintain her albuterol, [facility] Heartburn Relief Ex St, Xarelto, rosuvastatin, triamterene-hydrochlorothiazide, and nebivolol.  Meds ordered this encounter  Medications   aspirin EC (ASPIRIN LOW DOSE) 81 MG tablet    Sig: Take 1 tablet (81 mg total) by mouth 5 (five) times daily. SWALLOW WHOLE.    Dispense:  90 tablet    Refill:  1    DX Code Needed  .   Fluticasone-Umeclidin-Vilant (TRELEGY ELLIPTA) 100-62.5-25 MCG/ACT AEPB    Sig: Inhale 1 puff into the lungs daily.    Dispense:  120 each    Refill:  1     Follow-up: Return in about 6 months (around 11/17/2022).  Scarlette Calico, MD

## 2022-05-19 ENCOUNTER — Other Ambulatory Visit: Payer: Self-pay | Admitting: Gastroenterology

## 2022-05-19 ENCOUNTER — Encounter: Payer: Self-pay | Admitting: Internal Medicine

## 2022-05-19 MED ORDER — OMEPRAZOLE 40 MG PO CPDR: 40.0000 mg | DELAYED_RELEASE_CAPSULE | Freq: Two times a day (BID) | ORAL | 3 refills | Status: AC

## 2022-05-19 NOTE — Addendum Note (Signed)
Addended by: Stevan Born on: 05/19/2022 02:24 PM   Modules accepted: Orders

## 2022-05-19 NOTE — Telephone Encounter (Signed)
Good morning Dr Candis Schatz,   This is a patient of Dr Ardis Hughs.  I am sending you a refill request as you are DOD am.  Please advise

## 2022-05-21 DIAGNOSIS — R3129 Other microscopic hematuria: Secondary | ICD-10-CM | POA: Insufficient documentation

## 2022-06-16 ENCOUNTER — Other Ambulatory Visit: Payer: Self-pay | Admitting: Internal Medicine

## 2022-06-16 DIAGNOSIS — Z1231 Encounter for screening mammogram for malignant neoplasm of breast: Secondary | ICD-10-CM

## 2022-07-16 ENCOUNTER — Telehealth: Payer: Self-pay | Admitting: Internal Medicine

## 2022-07-16 NOTE — Telephone Encounter (Signed)
Pt scheduled for lab appt 08/06/2021. Per labs done 05/2022, looks like she was advised to recheck some labs.  Please enter lab orders for this visit.

## 2022-07-19 ENCOUNTER — Telehealth: Payer: Self-pay | Admitting: *Deleted

## 2022-07-19 DIAGNOSIS — F17210 Nicotine dependence, cigarettes, uncomplicated: Secondary | ICD-10-CM

## 2022-07-19 DIAGNOSIS — Z87891 Personal history of nicotine dependence: Secondary | ICD-10-CM

## 2022-07-19 DIAGNOSIS — Z122 Encounter for screening for malignant neoplasm of respiratory organs: Secondary | ICD-10-CM

## 2022-07-19 NOTE — Telephone Encounter (Signed)
PT called in and left voicemail. Attempted to contact pt to schedule follow up lung cancer screening CT scan. Unable to leave a message as voicemail has not been set up yet. Will attempt to call pt again .

## 2022-07-19 NOTE — Telephone Encounter (Signed)
Spoke with pt and scheduled lung screening CT 08/05/2022 9:40. PT verbalized understanding.

## 2022-07-27 ENCOUNTER — Other Ambulatory Visit: Payer: Self-pay | Admitting: Internal Medicine

## 2022-07-27 DIAGNOSIS — I10 Essential (primary) hypertension: Secondary | ICD-10-CM

## 2022-07-27 DIAGNOSIS — R3121 Asymptomatic microscopic hematuria: Secondary | ICD-10-CM | POA: Insufficient documentation

## 2022-08-05 ENCOUNTER — Ambulatory Visit
Admission: RE | Admit: 2022-08-05 | Discharge: 2022-08-05 | Disposition: A | Discharge status: Home / Self Care | Payer: Medicare Other | Source: Ambulatory Visit | Attending: Acute Care | Admitting: Acute Care

## 2022-08-05 DIAGNOSIS — Z87891 Personal history of nicotine dependence: Secondary | ICD-10-CM

## 2022-08-05 DIAGNOSIS — F17210 Nicotine dependence, cigarettes, uncomplicated: Secondary | ICD-10-CM

## 2022-08-05 DIAGNOSIS — Z122 Encounter for screening for malignant neoplasm of respiratory organs: Secondary | ICD-10-CM

## 2022-08-06 ENCOUNTER — Encounter: Payer: Self-pay | Admitting: Internal Medicine

## 2022-08-06 ENCOUNTER — Other Ambulatory Visit (INDEPENDENT_AMBULATORY_CARE_PROVIDER_SITE_OTHER): Payer: Medicare Other

## 2022-08-06 DIAGNOSIS — I10 Essential (primary) hypertension: Secondary | ICD-10-CM | POA: Diagnosis not present

## 2022-08-06 DIAGNOSIS — R3121 Asymptomatic microscopic hematuria: Secondary | ICD-10-CM

## 2022-08-06 LAB — URINALYSIS, ROUTINE W REFLEX MICROSCOPIC
Bilirubin Urine: NEGATIVE
Ketones, ur: NEGATIVE
Leukocytes,Ua: NEGATIVE
Nitrite: NEGATIVE
Specific Gravity, Urine: 1.015 (ref 1.000–1.030)
Total Protein, Urine: NEGATIVE
Urine Glucose: NEGATIVE
Urobilinogen, UA: 0.2 (ref 0.0–1.0)
pH: 6 (ref 5.0–8.0)

## 2022-08-09 ENCOUNTER — Other Ambulatory Visit: Payer: Self-pay

## 2022-08-09 DIAGNOSIS — F17210 Nicotine dependence, cigarettes, uncomplicated: Secondary | ICD-10-CM

## 2022-08-09 DIAGNOSIS — Z87891 Personal history of nicotine dependence: Secondary | ICD-10-CM

## 2022-08-09 DIAGNOSIS — Z122 Encounter for screening for malignant neoplasm of respiratory organs: Secondary | ICD-10-CM

## 2022-08-11 ENCOUNTER — Telehealth: Payer: Self-pay | Admitting: Acute Care

## 2022-08-11 NOTE — Telephone Encounter (Signed)
I have attempted to call the patient with the results of their  Low Dose CT Chest Lung cancer screening scan. There was no answer. I have left a HIPPA compliant VM requesting the patient call the office for the scan results. I included the office contact information in the message. We will await his return call. If no return call we will continue to call until patient is contacted.   She is a LR 2, with some findings of bronchiectasis on her scan. Please ask her if she has symptoms of bronchiectasis. ( Increased mucus production and frequent lung infections. ), if she does set her up for a referral to see any available doc for bronchiectasis. Thanks so much  Fax results to PCP and place order for 12 month follow up.   Bronchiectasis is the abnormal widening of the bronchi and their branches., causing risk of infection.  Bronchiectasis nay result from infections or medical conditions, such as pneumonia or cystic fibrosis. Mucus builds up and breeds bacteria, causing frequent infections.

## 2022-08-17 ENCOUNTER — Ambulatory Visit
Admission: RE | Admit: 2022-08-17 | Discharge: 2022-08-17 | Disposition: A | Discharge status: Home / Self Care | Payer: 59 | Source: Ambulatory Visit | Attending: Internal Medicine | Admitting: Internal Medicine

## 2022-08-17 DIAGNOSIS — Z1231 Encounter for screening mammogram for malignant neoplasm of breast: Secondary | ICD-10-CM

## 2022-08-19 ENCOUNTER — Telehealth: Payer: Self-pay | Admitting: Acute Care

## 2022-08-19 DIAGNOSIS — Z122 Encounter for screening for malignant neoplasm of respiratory organs: Secondary | ICD-10-CM

## 2022-08-19 DIAGNOSIS — Z87891 Personal history of nicotine dependence: Secondary | ICD-10-CM

## 2022-08-19 DIAGNOSIS — F17210 Nicotine dependence, cigarettes, uncomplicated: Secondary | ICD-10-CM

## 2022-08-19 DIAGNOSIS — J479 Bronchiectasis, uncomplicated: Secondary | ICD-10-CM

## 2022-08-19 NOTE — Telephone Encounter (Signed)
See telephone note 08/19/22 

## 2022-08-19 NOTE — Telephone Encounter (Signed)
Spoke with Amber Griffith regarding lung screening results. I advised that there were nothing seen concerning for lung cancer. Discussed bronchiectasis seen on scan. Amber Griffith is agreeable to a pulmonary consult. Will place order. Amber Griffith will follow up with PCP Dr Ronnald Ramp regarding cardiac finding, Amber Griffith is aware we will call her closer to 12 months to schedule next screening CT.

## 2022-08-19 NOTE — Telephone Encounter (Signed)
I called patient to give her the results of her low-dose screening CT.  There was no answer and the voicemail has never been set up so there is no option to leave a message.  I reached out to her son Amber Griffith as he has [facility], and explained that we would like to talk to his mother so we could explain some of the findings on her scan.  I told him everything was fine from a lung cancer standpoint but I wanted to talk to her about some incidental findings noted on the scan.  Patient's son took our number and stated that we Would have his mother give [place] a call. Amber Griffith patient needs an annual screening scan for January 2025.  I discussed the findings of coronary artery disease, bronchiectasis with the patient's son. Please ask patient if she would like a consult to [facility] pulmonary for the finding of bronchiectasis.  Let her know this would be for maintenance and management to decrease her risk of infection and scarring in her lungs. She is on statin therapy, however ask her if she would like a referral to cardiology to be evaluated. Please fax results to PCP, let them know plan for care.  Thanks so much

## 2022-08-29 NOTE — Progress Notes (Unsigned)
    Subjective:    Patient ID: Amber Griffith, female    DOB: 1951/04/05, 72 y.o.   MRN: [id number]      HPI Amber Griffith is here for No chief complaint on file.   She is here for an acute visit for cold symptoms.   Her symptoms started   She is experiencing   She has tried taking       Medications and allergies reviewed with patient and updated if appropriate.  Current Outpatient Medications on File Prior to Visit  Medication Sig Dispense Refill   albuterol (VENTOLIN HFA) 108 (90 Base) MCG/ACT inhaler Inhale 2 puffs into the lungs every 6 (six) hours as needed for wheezing or shortness of breath. 6.7 g 1   aspirin EC (ASPIRIN LOW DOSE) 81 MG tablet Take 1 tablet (81 mg total) by mouth 5 (five) times daily. SWALLOW WHOLE. 90 tablet 1   [facility] HEARTBURN RELIEF EX ST 254-237.5 MG/5ML SUSP TAKE 5 MLS BY MOUTH 3 (THREE) TIMES DAILY AS NEEDED. 355 mL 0   Fluticasone-Umeclidin-Vilant (TRELEGY ELLIPTA) 100-62.5-25 MCG/ACT AEPB Inhale 1 puff into the lungs daily. 120 each 1   nebivolol (BYSTOLIC) 5 MG tablet TAKE 1 TABLET (5 MG TOTAL) BY MOUTH DAILY. 90 tablet 1   omeprazole (PRILOSEC) 40 MG capsule Take 1 capsule (40 mg total) by mouth 2 (two) times daily before a meal. 180 capsule 3   rosuvastatin (CRESTOR) 5 MG tablet TAKE 1 TABLET (5 MG TOTAL) BY MOUTH DAILY. 90 tablet 1   triamterene-hydrochlorothiazide (DYAZIDE) 37.5-25 MG capsule TAKE 1 CAPSULE BY MOUTH EVERY DAY 90 capsule 1   XARELTO 2.5 MG TABS tablet TAKE 1 TABLET BY MOUTH TWICE A DAY 180 tablet 1   No current facility-administered medications on file prior to visit.    Review of Systems     Objective:  There were no vitals filed for this visit. BP Readings from Last 3 Encounters:  05/18/22 (!) 144/68  11/12/21 (!) 158/72  09/16/21 138/80   Wt Readings from Last 3 Encounters:  05/18/22 94 lb (42.6 kg)  11/12/21 97 lb (44 kg)  09/16/21 94 lb 3.2 oz (42.7 kg)   There is no height or weight on file to calculate  BMI.    Physical Exam         Assessment & Plan:    See Problem List for Assessment and Plan of chronic medical problems.

## 2022-08-30 ENCOUNTER — Encounter: Payer: Self-pay | Admitting: Internal Medicine

## 2022-08-30 ENCOUNTER — Ambulatory Visit (INDEPENDENT_AMBULATORY_CARE_PROVIDER_SITE_OTHER): Payer: 59 | Admitting: Internal Medicine

## 2022-08-30 VITALS — BP 146/80 | HR 55 | Temp 98.3°F | Ht <= 58 in | Wt 93.6 lb

## 2022-08-30 DIAGNOSIS — I10 Essential (primary) hypertension: Secondary | ICD-10-CM

## 2022-08-30 DIAGNOSIS — J441 Chronic obstructive pulmonary disease with (acute) exacerbation: Secondary | ICD-10-CM | POA: Diagnosis not present

## 2022-08-30 MED ORDER — DOXYCYCLINE HYCLATE 100 MG PO TABS: 100.0000 mg | ORAL_TABLET | Freq: Two times a day (BID) | ORAL | 0 refills | Status: AC

## 2022-08-30 MED ORDER — PREDNISONE 10 MG PO TABS: ORAL_TABLET | ORAL | 0 refills | Status: DC

## 2022-08-30 NOTE — Patient Instructions (Addendum)
      Medications changes include :   doxycycline and prednisone taper.      Return if symptoms worsen or fail to improve.

## 2022-09-06 ENCOUNTER — Telehealth: Payer: Self-pay | Admitting: Internal Medicine

## 2022-09-06 NOTE — Telephone Encounter (Signed)
PT calls today post visit with Dr.Burns on 08/30/22. PT was informed to reach back out if she was still feeling rough after a week. PT is still dealing with SOB, only able to use inhaler once a day, and now had diarrhea present (PT stated the last symptom was not present during the visit).  PT is still taking both medications as prescribed by Dr.Burns doxycycline (VIBRA-TABS) 100 MG tablet and predniSONE (DELTASONE) 10 MG tablet . PT also stated having a bout of "fever on her stomach" on Sunday.   Wants to know what next steps should be as the symptoms are still there?  CB: [phone]

## 2022-09-06 NOTE — Telephone Encounter (Signed)
Have her stop the doxycycline to see if the diarrhea stops.  Continue the prednisone.  Make sure she is taking the Trelegy once a day and the albuterol 2 puffs every 6 hours as needed.

## 2022-09-07 ENCOUNTER — Other Ambulatory Visit: Payer: Self-pay

## 2022-09-07 MED ORDER — ALBUTEROL SULFATE HFA 108 (90 BASE) MCG/ACT IN AERS: 2.0000 | INHALATION_SPRAY | Freq: Four times a day (QID) | RESPIRATORY_TRACT | 4 refills | Status: DC | PRN

## 2022-09-07 NOTE — Telephone Encounter (Signed)
Spoke with patient today and inhaler sent in for her refill on the Albuterol.

## 2022-09-13 ENCOUNTER — Encounter: Payer: Self-pay | Admitting: Family Medicine

## 2022-09-13 ENCOUNTER — Ambulatory Visit (INDEPENDENT_AMBULATORY_CARE_PROVIDER_SITE_OTHER): Payer: 59 | Admitting: Family Medicine

## 2022-09-13 VITALS — BP 142/70 | HR 88 | Temp 97.5°F | Resp 22 | Ht <= 58 in | Wt 94.0 lb

## 2022-09-13 DIAGNOSIS — K649 Unspecified hemorrhoids: Secondary | ICD-10-CM | POA: Diagnosis not present

## 2022-09-13 DIAGNOSIS — J441 Chronic obstructive pulmonary disease with (acute) exacerbation: Secondary | ICD-10-CM | POA: Diagnosis not present

## 2022-09-13 MED ORDER — AZITHROMYCIN 250 MG PO TABS: ORAL_TABLET | ORAL | 0 refills | Status: DC

## 2022-09-13 NOTE — Progress Notes (Signed)
Assessment & Plan:  1. COPD with acute exacerbation [facility]) Education provided on COPD exacerbations.  Z-Pak prescribed today since patient was unable to tolerate doxycycline due to diarrhea.  Encouraged to continue Trelegy, albuterol, and symptomatic treatment. - azithromycin (ZITHROMAX Z-PAK) 250 MG tablet; Take 2 tablets (500 mg) by mouth today, then 1 tablet (250 mg) daily x4 days.  Dispense: 6 tablet; Refill: 0  2. Hemorrhoids without complication Improving.  Education provided on hemorrhoids.  Continue Preparation H.  Encouraged patient to purchase Tucks pads OTC.   Follow up plan: Return if symptoms worsen or fail to improve.  Hendricks Limes, MSN, APRN, FNP-C  Subjective:  HPI: Amber Griffith is a 72 y.o. female presenting on 09/13/2022 for Sore Throat (Hoarseness/) and Hemorrhoids  Sore Throat: Patient complains of sore throat and hoarseness . Additional symptoms include shortness of breath and wheezing that are worse than her baseline with her COPD. Onset of symptoms was  2.5  weeks ago. She is drinking plenty of fluids. Evaluation to date: previously seen and diagnosed with a COPD exacerbation. She was prescribed Doxycycline and Prednisone, but was unable to take the Doxycycline due to diarrhea. Treatment to date:  Albuterol Q6H, Trelegy daily, and Prednisone . She has been drinking hot tea and using cough drops. She has a history of asthma/COPD. She does smoke.   Hemorrhoids: Patient complains of  hemorrhoids that worsened with diarrhea from antibiotics . Onset of symptoms was 2 weeks ago ago with gradually improving course since that time.  She describes symptoms as bleeding which only occurs with bowel movements. Treatment to date has been OTC creams: effective Sitz baths . Patient denies maroon colored stools and melena.   Patient is very upset today as she has recently lost her dog and brother a month ago. Her father is in the hospital and they will not let her visit due to  her symptoms.    ROS: Negative unless specifically indicated above in HPI.   Relevant past medical history reviewed and updated as indicated.   Allergies and medications reviewed and updated.   Current Outpatient Medications:    albuterol (VENTOLIN HFA) 108 (90 Base) MCG/ACT inhaler, Inhale 2 puffs into the lungs every 6 (six) hours as needed for wheezing or shortness of breath., Disp: 6.7 g, Rfl: 4   aspirin EC (ASPIRIN LOW DOSE) 81 MG tablet, Take 1 tablet (81 mg total) by mouth 5 (five) times daily. SWALLOW WHOLE., Disp: 90 tablet, Rfl: 1   [employer] HEARTBURN RELIEF EX ST 254-237.5 MG/5ML SUSP, TAKE 5 MLS BY MOUTH 3 (THREE) TIMES DAILY AS NEEDED., Disp: 355 mL, Rfl: 0   Fluticasone-Umeclidin-Vilant (TRELEGY ELLIPTA) 100-62.5-25 MCG/ACT AEPB, Inhale 1 puff into the lungs daily., Disp: 120 each, Rfl: 1   nebivolol (BYSTOLIC) 5 MG tablet, TAKE 1 TABLET (5 MG TOTAL) BY MOUTH DAILY., Disp: 90 tablet, Rfl: 1   omeprazole (PRILOSEC) 40 MG capsule, Take 1 capsule (40 mg total) by mouth 2 (two) times daily before a meal., Disp: 180 capsule, Rfl: 3   rosuvastatin (CRESTOR) 5 MG tablet, TAKE 1 TABLET (5 MG TOTAL) BY MOUTH DAILY., Disp: 90 tablet, Rfl: 1   triamterene-hydrochlorothiazide (DYAZIDE) 37.5-25 MG capsule, TAKE 1 CAPSULE BY MOUTH EVERY DAY, Disp: 90 capsule, Rfl: 1   XARELTO 2.5 MG TABS tablet, TAKE 1 TABLET BY MOUTH TWICE A DAY, Disp: 180 tablet, Rfl: 1  No Known Allergies  Objective:   BP (!) 142/70 Comment: manual  Pulse 88   Temp (!)  97.5 F (36.4 C)   Resp (!) 22   Ht 4' 10"$  (1.473 m)   Wt 94 lb (42.6 kg)   BMI 19.65 kg/m    Physical Exam Vitals reviewed.  Constitutional:      General: She is not in acute distress.    Appearance: Normal appearance. She is underweight. She is not ill-appearing, toxic-appearing or diaphoretic.  HENT:     Head: Normocephalic and atraumatic.     Mouth/Throat:     Comments: Hoarse Eyes:     General: No scleral icterus.       Right eye: No  discharge.        Left eye: No discharge.     Conjunctiva/sclera: Conjunctivae normal.  Cardiovascular:     Rate and Rhythm: Normal rate and regular rhythm.     Heart sounds: Normal heart sounds. No murmur heard.    No friction rub. No gallop.  Pulmonary:     Effort: Pulmonary effort is normal. No respiratory distress.     Breath sounds: Normal breath sounds. No stridor. No wheezing, rhonchi or rales.  Musculoskeletal:        General: Normal range of motion.     Cervical back: Normal range of motion.  Skin:    General: Skin is warm and dry.     Capillary Refill: Capillary refill takes less than 2 seconds.  Neurological:     General: No focal deficit present.     Mental Status: She is alert and oriented to person, place, and time. Mental status is at baseline.  Psychiatric:        Mood and Affect: Mood normal.        Behavior: Behavior normal.        Thought Content: Thought content normal.        Judgment: Judgment normal.

## 2022-09-13 NOTE — Patient Instructions (Signed)
Tucks pads for hemorrhoids.

## 2022-11-17 ENCOUNTER — Ambulatory Visit (INDEPENDENT_AMBULATORY_CARE_PROVIDER_SITE_OTHER): Payer: 59 | Admitting: Internal Medicine

## 2022-11-17 ENCOUNTER — Encounter: Payer: Self-pay | Admitting: Internal Medicine

## 2022-11-17 ENCOUNTER — Other Ambulatory Visit: Payer: Self-pay | Admitting: Internal Medicine

## 2022-11-17 VITALS — BP 144/76 | HR 60 | Temp 97.9°F | Ht <= 58 in | Wt 94.0 lb

## 2022-11-17 DIAGNOSIS — I10 Essential (primary) hypertension: Secondary | ICD-10-CM

## 2022-11-17 DIAGNOSIS — Z1211 Encounter for screening for malignant neoplasm of colon: Secondary | ICD-10-CM

## 2022-11-17 DIAGNOSIS — E7800 Pure hypercholesterolemia, unspecified: Secondary | ICD-10-CM

## 2022-11-17 DIAGNOSIS — I779 Disorder of arteries and arterioles, unspecified: Secondary | ICD-10-CM

## 2022-11-17 DIAGNOSIS — J4489 Other specified chronic obstructive pulmonary disease: Secondary | ICD-10-CM

## 2022-11-17 DIAGNOSIS — Z23 Encounter for immunization: Secondary | ICD-10-CM

## 2022-11-17 LAB — BASIC METABOLIC PANEL
BUN: 14 mg/dL (ref 6–23)
CO2: 29 mEq/L (ref 19–32)
Calcium: 9.6 mg/dL (ref 8.4–10.5)
Chloride: 103 mEq/L (ref 96–112)
Creatinine, Ser: 0.82 mg/dL (ref 0.40–1.20)
GFR: 72.54 mL/min (ref >60.00)
Glucose, Bld: 89 mg/dL (ref 70–99)
Potassium: 4.2 mEq/L (ref 3.5–5.1)
Sodium: 140 mEq/L (ref 135–145)

## 2022-11-17 LAB — CBC WITH DIFFERENTIAL/PLATELET
Basophils Absolute: 0.1 10*3/uL (ref 0.0–0.1)
Basophils Relative: 0.9 % (ref 0.0–3.0)
Eosinophils Absolute: 0.1 10*3/uL (ref 0.0–0.7)
Eosinophils Relative: 1.2 % (ref 0.0–5.0)
HCT: 39.4 % (ref 36.0–46.0)
Hemoglobin: 12.9 g/dL (ref 12.0–15.0)
Lymphocytes Relative: 33.2 % (ref 12.0–46.0)
Lymphs Abs: 2.3 10*3/uL (ref 0.7–4.0)
MCHC: 32.8 g/dL (ref 30.0–36.0)
MCV: 91.6 fl (ref 78.0–100.0)
Monocytes Absolute: 0.8 10*3/uL (ref 0.1–1.0)
Monocytes Relative: 12 % (ref 3.0–12.0)
Neutro Abs: 3.6 10*3/uL (ref 1.4–7.7)
Neutrophils Relative %: 52.7 % (ref 43.0–77.0)
Platelets: 263 10*3/uL (ref 150.0–400.0)
RBC: 4.31 Mil/uL (ref 3.87–5.11)
RDW: 12.8 % (ref 11.5–15.5)
WBC: 6.8 10*3/uL (ref 4.0–10.5)

## 2022-11-17 LAB — URINALYSIS, ROUTINE W REFLEX MICROSCOPIC
Bilirubin Urine: NEGATIVE
Ketones, ur: NEGATIVE
Leukocytes,Ua: NEGATIVE
Nitrite: NEGATIVE
Specific Gravity, Urine: 1.015 (ref 1.000–1.030)
Total Protein, Urine: NEGATIVE
Urine Glucose: NEGATIVE
Urobilinogen, UA: 0.2 (ref 0.0–1.0)
WBC, UA: NONE SEEN (ref 0–?)
pH: 6 (ref 5.0–8.0)

## 2022-11-17 MED ORDER — NEBIVOLOL HCL 5 MG PO TABS: 5.0000 mg | ORAL_TABLET | Freq: Every day | ORAL | 1 refills | Status: DC

## 2022-11-17 MED ORDER — BOOSTRIX 5-2.5-18.5 LF-MCG/0.5 IM SUSP: 0.5000 mL | Freq: Once | INTRAMUSCULAR | 0 refills | Status: AC

## 2022-11-17 MED ORDER — XARELTO 2.5 MG PO TABS: 2.5000 mg | ORAL_TABLET | Freq: Two times a day (BID) | ORAL | 1 refills | Status: DC

## 2022-11-17 MED ORDER — TRIAMTERENE-HCTZ 37.5-25 MG PO CAPS: 1.0000 | ORAL_CAPSULE | Freq: Every day | ORAL | 1 refills | Status: DC

## 2022-11-17 MED ORDER — SHINGRIX 50 MCG/0.5ML IM SUSR: 0.5000 mL | Freq: Once | INTRAMUSCULAR | 1 refills | Status: AC

## 2022-11-17 MED ORDER — ASPIRIN 81 MG PO TBEC: 81.0000 mg | DELAYED_RELEASE_TABLET | Freq: Every day | ORAL | 1 refills | Status: DC

## 2022-11-17 MED ORDER — TRELEGY ELLIPTA 100-62.5-25 MCG/ACT IN AEPB: 1.0000 | INHALATION_SPRAY | Freq: Every day | RESPIRATORY_TRACT | 1 refills | Status: DC

## 2022-11-17 MED ORDER — ROSUVASTATIN CALCIUM 5 MG PO TABS: 5.0000 mg | ORAL_TABLET | Freq: Every day | ORAL | 1 refills | Status: DC

## 2022-11-17 NOTE — Progress Notes (Signed)
Subjective:  Patient ID: Amber Griffith, female    DOB: 1951-04-20  Age: 72 y.o. MRN: [id number]  CC: Hypertension and COPD   HPI Amber Griffith presents for f/up ----  She is active and denies CP, diaphoresis, edema.  Outpatient Medications Prior to Visit  Medication Sig Dispense Refill   albuterol (VENTOLIN HFA) 108 (90 Base) MCG/ACT inhaler Inhale 2 puffs into the lungs every 6 (six) hours as needed for wheezing or shortness of breath. 6.7 g 4   [facility] HE[facility] RELIEF EX ST 254-237.5 MG/5ML SUSP TAKE 5 MLS BY MOUTH 3 (THREE) TIMES DAILY AS NEEDED. 355 mL 0   omeprazole (PRILOSEC) 40 MG capsule Take 1 capsule (40 mg total) by mouth 2 (two) times daily before a meal. 180 capsule 3   aspirin EC (ASPIRIN LOW DOSE) 81 MG tablet Take 1 tablet (81 mg total) by mouth 5 (five) times daily. SWALLOW WHOLE. 90 tablet 1   azithromycin (ZITHROMAX Z-PAK) 250 MG tablet Take 2 tablets (500 mg) by mouth today, then 1 tablet (250 mg) daily x4 days. 6 tablet 0   Fluticasone-Umeclidin-Vilant (TRELEGY ELLIPTA) 100-62.5-25 MCG/ACT AEPB Inhale 1 puff into the lungs daily. 120 each 1   nebivolol (BYSTOLIC) 5 MG tablet TAKE 1 TABLET (5 MG TOTAL) BY MOUTH DAILY. 90 tablet 1   rosuvastatin (CRESTOR) 5 MG tablet TAKE 1 TABLET (5 MG TOTAL) BY MOUTH DAILY. 90 tablet 1   triamterene-hydrochlorothiazide (DYAZIDE) 37.5-25 MG capsule TAKE 1 CAPSULE BY MOUTH EVERY DAY 90 capsule 1   XARELTO 2.5 MG TABS tablet TAKE 1 TABLET BY MOUTH TWICE A DAY 180 tablet 1   No facility-administered medications prior to visit.    ROS Review of Systems  Constitutional: Negative.  Negative for diaphoresis and fatigue.  HENT: Negative.    Eyes: Negative.   Respiratory:  Positive for cough (clear phlegm) and wheezing. Negative for chest tightness, shortness of breath and stridor.   Cardiovascular:  Negative for chest pain, palpitations and leg swelling.  Gastrointestinal:  Negative for abdominal pain, constipation, diarrhea,  nausea and vomiting.  Endocrine: Negative.   Genitourinary: Negative.  Negative for difficulty urinating.  Musculoskeletal: Negative.  Negative for myalgias.  Skin: Negative.   Neurological:  Negative for dizziness and weakness.  Hematological:  Negative for adenopathy. Does not bruise/bleed easily.  Psychiatric/Behavioral: Negative.      Objective:  BP (!) 144/76 (BP Location: Right Arm, Patient Position: Sitting, Cuff Size: Large)   Pulse 60   Temp 97.9 F (36.6 C) (Oral)   Ht 4\' 10"  (1.473 m)   Wt 94 lb (42.6 kg)   SpO2 96%   BMI 19.65 kg/m   BP Readings from Last 3 Encounters:  11/17/22 (!) 144/76  09/13/22 (!) 142/70  08/30/22 (!) 146/80    Wt Readings from Last 3 Encounters:  11/17/22 94 lb (42.6 kg)  09/13/22 94 lb (42.6 kg)  08/30/22 93 lb 9.6 oz (42.5 kg)    Physical Exam Vitals reviewed.  HENT:     Mouth/Throat:     Mouth: Mucous membranes are moist.  Eyes:     General: No scleral icterus.    Conjunctiva/sclera: Conjunctivae normal.  Cardiovascular:     Rate and Rhythm: Normal rate and regular rhythm.     Pulses:          Dorsalis pedis pulses are 1+ on the right side and 1+ on the left side.       Posterior tibial pulses are 1+ on the  right side and 1+ on the left side.  Pulmonary:     Effort: Pulmonary effort is normal.     Breath sounds: No stridor. No wheezing, rhonchi or rales.  Abdominal:     Palpations: There is no mass.     Tenderness: There is no abdominal tenderness. There is no guarding.     Hernia: No hernia is present.  Musculoskeletal:     Cervical back: Neck supple.  Feet:     Right foot:     Skin integrity: Skin integrity normal.     Toenail Condition: Right toenails are normal.     Left foot:     Skin integrity: Skin integrity normal.     Toenail Condition: Left toenails are normal.  Lymphadenopathy:     Cervical: No cervical adenopathy.  Skin:    General: Skin is warm and dry.  Neurological:     General: No focal deficit  present.     Mental Status: She is alert. Mental status is at baseline.  Psychiatric:        Mood and Affect: Mood normal.        Behavior: Behavior normal.     Lab Results  Component Value Date   WBC 6.8 11/17/2022   HGB 12.9 11/17/2022   HCT 39.4 11/17/2022   PLT 263.0 11/17/2022   GLUCOSE 89 11/17/2022   CHOL 159 05/18/2022   TRIG 192.0 (H) 05/18/2022   HDL 47.70 05/18/2022   LDLDIRECT 84.0 01/25/2018   LDLCALC 73 05/18/2022   ALT 11 05/18/2022   AST 14 05/18/2022   NA 140 11/17/2022   K 4.2 11/17/2022   CL 103 11/17/2022   CREATININE 0.82 11/17/2022   BUN 14 11/17/2022   CO2 29 11/17/2022   TSH 1.13 05/18/2022   HGBA1C 4.9 07/30/2013    MM 3D SCREEN BREAST BILATERAL  Result Date: 08/18/2022 CLINICAL DATA:  Screening. EXAM: DIGITAL SCREENING BILATERAL MAMMOGRAM WITH TOMOSYNTHESIS AND CAD TECHNIQUE: Bilateral screening digital craniocaudal and mediolateral oblique mammograms were obtained. Bilateral screening digital breast tomosynthesis was performed. The images were evaluated with computer-aided detection. COMPARISON:  Previous exam(s). ACR Breast Density Category c: The breast tissue is heterogeneously dense, which may obscure small masses. FINDINGS: There are no findings suspicious for malignancy. IMPRESSION: No mammographic evidence of malignancy. A result letter of this screening mammogram will be mailed directly to the patient. RECOMMENDATION: Screening mammogram in one year. (Code:SM-B-01Y) BI-RADS CATEGORY  1: Negative. Electronically Signed   By: Sande Brothers M.D.   On: 08/18/2022 08:29    Assessment & Plan:   Screen for colon cancer -     Ambulatory referral to Gastroenterology  Essential hypertension- Her BP is adequately well controlled. -     Basic metabolic panel; Future -     CBC with Differential/Platelet; Future -     Urinalysis, Routine w reflex microscopic; Future -     Nebivolol HCl; Take 1 tablet (5 mg total) by mouth daily.  Dispense: 90  tablet; Refill: 1 -     Triamterene-HCTZ; Take 1 each (1 capsule total) by mouth daily.  Dispense: 90 capsule; Refill: 1  Pure hypercholesterolemia -     Rosuvastatin Calcium; Take 1 tablet (5 mg total) by mouth daily.  Dispense: 90 tablet; Refill: 1  Need for prophylactic vaccination with combined diphtheria-tetanus-pertussis (DTP) vaccine -     Boostrix; Inject 0.5 mLs into the muscle once for 1 dose.  Dispense: 0.5 mL; Refill: 0  Need for varicella vaccine -  Shingrix; Inject 0.5 mLs into the muscle once for 1 dose.  Dispense: 0.5 mL; Refill: 1  COPD (chronic obstructive pulmonary disease) with chronic bronchitis -     Trelegy Ellipta; Inhale 1 puff into the lungs daily.  Dispense: 120 each; Refill: 1  PAOD (peripheral arterial occlusive disease) -     Xarelto; Take 1 tablet (2.5 mg total) by mouth 2 (two) times daily.  Dispense: 180 tablet; Refill: 1 -     Aspirin; Take 1 tablet (81 mg total) by mouth 5 (five) times daily. SWALLOW WHOLE.  Dispense: 90 tablet; Refill: 1     Follow-up: Return in about 6 months (around 05/19/2023).  Sanda Linger, MD

## 2022-11-17 NOTE — Patient Instructions (Signed)
Hypertension, Adult High blood pressure (hypertension) is when the force of blood pumping through the arteries is too strong. The arteries are the blood vessels that carry blood from the heart throughout the body. Hypertension forces the heart to work harder to pump blood and may cause arteries to become narrow or stiff. Untreated or uncontrolled hypertension can lead to a heart attack, heart failure, a stroke, kidney disease, and other problems. A blood pressure reading consists of a higher number over a lower number. Ideally, your blood pressure should be below 120/80. The first ("top") number is called the systolic pressure. It is a measure of the pressure in your arteries as your heart beats. The second ("bottom") number is called the diastolic pressure. It is a measure of the pressure in your arteries as the heart relaxes. What are the causes? The exact cause of this condition is not known. There are some conditions that result in high blood pressure. What increases the risk? Certain factors may make you more likely to develop high blood pressure. Some of these risk factors are under your control, including: Smoking. Not getting enough exercise or physical activity. Being overweight. Having too much fat, sugar, calories, or salt (sodium) in your diet. Drinking too much alcohol. Other risk factors include: Having a personal history of heart disease, diabetes, high cholesterol, or kidney disease. Stress. Having a family history of high blood pressure and high cholesterol. Having obstructive sleep apnea. Age. The risk increases with age. What are the signs or symptoms? High blood pressure may not cause symptoms. Very high blood pressure (hypertensive crisis) may cause: Headache. Fast or irregular heartbeats (palpitations). Shortness of breath. Nosebleed. Nausea and vomiting. Vision changes. Severe chest pain, dizziness, and seizures. How is this diagnosed? This condition is diagnosed by  measuring your blood pressure while you are seated, with your arm resting on a flat surface, your legs uncrossed, and your feet flat on the floor. The cuff of the blood pressure monitor will be placed directly against the skin of your upper arm at the level of your heart. Blood pressure should be measured at least twice using the same arm. Certain conditions can cause a difference in blood pressure between your right and left arms. If you have a high blood pressure reading during one visit or you have normal blood pressure with other risk factors, you may be asked to: Return on a different day to have your blood pressure checked again. Monitor your blood pressure at home for 1 week or longer. If you are diagnosed with hypertension, you may have other blood or imaging tests to help your health care provider understand your overall risk for other conditions. How is this treated? This condition is treated by making healthy lifestyle changes, such as eating healthy foods, exercising more, and reducing your alcohol intake. You may be referred for counseling on a healthy diet and physical activity. Your health care provider may prescribe medicine if lifestyle changes are not enough to get your blood pressure under control and if: Your systolic blood pressure is above 130. Your diastolic blood pressure is above 80. Your personal target blood pressure may vary depending on your medical conditions, your age, and other factors. Follow these instructions at home: Eating and drinking  Eat a diet that is high in fiber and potassium, and low in sodium, added sugar, and fat. An example of this eating plan is called the DASH diet. DASH stands for Dietary Approaches to Stop Hypertension. To eat this way: Eat   plenty of fresh fruits and vegetables. Try to fill one half of your plate at each meal with fruits and vegetables. Eat whole grains, such as whole-wheat pasta, brown rice, or whole-grain bread. Fill about one  fourth of your plate with whole grains. Eat or drink low-fat dairy products, such as skim milk or low-fat yogurt. Avoid fatty cuts of meat, processed or cured meats, and poultry with skin. Fill about one fourth of your plate with lean proteins, such as fish, chicken without skin, beans, eggs, or tofu. Avoid pre-made and processed foods. These tend to be higher in sodium, added sugar, and fat. Reduce your daily sodium intake. Many people with hypertension should eat less than 1,500 mg of sodium a day. Do not drink alcohol if: Your health care provider tells you not to drink. You are pregnant, may be pregnant, or are planning to become pregnant. If you drink alcohol: Limit how much you have to: 0-1 drink a day for women. 0-2 drinks a day for men. Know how much alcohol is in your drink. In the [place] one drink equals one 12 oz bottle of beer (355 mL), one 5 oz glass of wine (148 mL), or one 1 oz glass of hard liquor (44 mL). Lifestyle  Work with your health care provider to maintain a healthy body weight or to lose weight. Ask what an ideal weight is for you. Get at least 30 minutes of exercise that causes your heart to beat faster (aerobic exercise) most days of the week. Activities may include walking, swimming, or biking. Include exercise to strengthen your muscles (resistance exercise), such as Pilates or lifting weights, as part of your weekly exercise routine. Try to do these types of exercises for 30 minutes at least 3 days a week. Do not use any products that contain nicotine or tobacco. These products include cigarettes, chewing tobacco, and vaping devices, such as e-cigarettes. If you need help quitting, ask your health care provider. Monitor your blood pressure at home as told by your health care provider. Keep all follow-up visits. This is important. Medicines Take over-the-counter and prescription medicines only as told by your health care provider. Follow directions carefully. Blood  pressure medicines must be taken as prescribed. Do not skip doses of blood pressure medicine. Doing this puts you at risk for problems and can make the medicine less effective. Ask your health care provider about side effects or reactions to medicines that you should watch for. Contact a health care provider if you: Think you are having a reaction to a medicine you are taking. Have headaches that keep coming back (recurring). Feel dizzy. Have swelling in your ankles. Have trouble with your vision. Get help right away if you: Develop a severe headache or confusion. Have unusual weakness or numbness. Feel faint. Have severe pain in your chest or abdomen. Vomit repeatedly. Have trouble breathing. These symptoms may be an emergency. Get help right away. Call 911. Do not wait to see if the symptoms will go away. Do not drive yourself to the hospital. Summary Hypertension is when the force of blood pumping through your arteries is too strong. If this condition is not controlled, it may put you at risk for serious complications. Your personal target blood pressure may vary depending on your medical conditions, your age, and other factors. For most people, a normal blood pressure is less than 120/80. Hypertension is treated with lifestyle changes, medicines, or a combination of both. Lifestyle changes include losing weight, eating a healthy,   low-sodium diet, exercising more, and limiting alcohol. This information is not intended to replace advice given to you by your health care provider. Make sure you discuss any questions you have with your health care provider. Document Revised: 05/26/2021 Document Reviewed: 05/26/2021 Elsevier Patient Education  2023 [employer]  

## 2022-11-19 ENCOUNTER — Encounter: Payer: Self-pay | Admitting: Internal Medicine

## 2022-12-05 ENCOUNTER — Emergency Department (HOSPITAL_COMMUNITY): Payer: 59

## 2022-12-05 ENCOUNTER — Encounter (HOSPITAL_COMMUNITY): Payer: Self-pay

## 2022-12-05 ENCOUNTER — Emergency Department (HOSPITAL_COMMUNITY)
Admission: EM | Admit: 2022-12-05 | Discharge: 2022-12-05 | Disposition: A | Discharge status: Home / Self Care | Payer: 59 | Attending: Emergency Medicine | Admitting: Emergency Medicine

## 2022-12-05 DIAGNOSIS — Z7901 Long term (current) use of anticoagulants: Secondary | ICD-10-CM | POA: Diagnosis not present

## 2022-12-05 DIAGNOSIS — Z7982 Long term (current) use of aspirin: Secondary | ICD-10-CM | POA: Insufficient documentation

## 2022-12-05 DIAGNOSIS — S0990XA Unspecified injury of head, initial encounter: Secondary | ICD-10-CM | POA: Diagnosis not present

## 2022-12-05 DIAGNOSIS — S6991XA Unspecified injury of right wrist, hand and finger(s), initial encounter: Secondary | ICD-10-CM | POA: Diagnosis present

## 2022-12-05 DIAGNOSIS — S52501A Unspecified fracture of the lower end of right radius, initial encounter for closed fracture: Secondary | ICD-10-CM | POA: Diagnosis not present

## 2022-12-05 DIAGNOSIS — M25531 Pain in right wrist: Secondary | ICD-10-CM | POA: Diagnosis not present

## 2022-12-05 DIAGNOSIS — Y92410 Unspecified street and highway as the place of occurrence of the external cause: Secondary | ICD-10-CM | POA: Diagnosis not present

## 2022-12-05 DIAGNOSIS — S52601A Unspecified fracture of lower end of right ulna, initial encounter for closed fracture: Secondary | ICD-10-CM | POA: Diagnosis not present

## 2022-12-05 LAB — COMPREHENSIVE METABOLIC PANEL
ALT: 20 U/L (ref 0–44)
AST: 23 U/L (ref 15–41)
Albumin: 4.4 g/dL (ref 3.5–5.0)
Alkaline Phosphatase: 54 U/L (ref 38–126)
Anion gap: 15 (ref 5–15)
BUN: 11 mg/dL (ref 8–23)
CO2: 19 mmol/L — ABNORMAL LOW (ref 22–32)
Calcium: 9.2 mg/dL (ref 8.9–10.3)
Chloride: 103 mmol/L (ref 98–111)
Creatinine, Ser: 0.75 mg/dL (ref 0.44–1.00)
GFR, Estimated: >60 mL/min (ref >60)
Glucose, Bld: 99 mg/dL (ref 70–99)
Potassium: 3.4 mmol/L — ABNORMAL LOW (ref 3.5–5.1)
Sodium: 137 mmol/L (ref 135–145)
Total Bilirubin: 1 mg/dL (ref 0.3–1.2)
Total Protein: 7.5 g/dL (ref 6.5–8.1)

## 2022-12-05 LAB — CBC
HCT: 38.3 % (ref 36.0–46.0)
Hemoglobin: 12.7 g/dL (ref 12.0–15.0)
MCH: 29.5 pg (ref 26.0–34.0)
MCHC: 33.2 g/dL (ref 30.0–36.0)
MCV: 89.1 fL (ref 80.0–100.0)
Platelets: 261 10*3/uL (ref 150–400)
RBC: 4.3 MIL/uL (ref 3.87–5.11)
RDW: 12.6 % (ref 11.5–15.5)
WBC: 10.4 10*3/uL (ref 4.0–10.5)
nRBC: 0 % (ref 0.0–0.2)

## 2022-12-05 LAB — PROTIME-INR
INR: 1.1 (ref 0.8–1.2)
Prothrombin Time: 14.5 seconds (ref 11.4–15.2)

## 2022-12-05 LAB — ETHANOL: Alcohol, Ethyl (B): <10 mg/dL (ref <10)

## 2022-12-05 MED ORDER — FENTANYL CITRATE (PF) 100 MCG/2ML IJ SOLN
INTRAMUSCULAR | Status: AC | PRN
  Administered 2022-12-05: 25 ug via INTRAVENOUS

## 2022-12-05 MED ORDER — ONDANSETRON HCL 4 MG/2ML IJ SOLN
4.0000 mg | Freq: Once | INTRAMUSCULAR | Status: AC
  Administered 2022-12-05: 4 mg via INTRAVENOUS
  Filled 2022-12-05: qty 2

## 2022-12-05 MED ORDER — PROPOFOL 10 MG/ML IV BOLUS
INTRAVENOUS | Status: AC | PRN
  Administered 2022-12-05 (×4): 20 mg via INTRAVENOUS

## 2022-12-05 MED ORDER — FENTANYL CITRATE PF 50 MCG/ML IJ SOSY
PREFILLED_SYRINGE | INTRAMUSCULAR | Status: AC
  Filled 2022-12-05: qty 1

## 2022-12-05 MED ORDER — POTASSIUM CHLORIDE CRYS ER 20 MEQ PO TBCR
20.0000 meq | EXTENDED_RELEASE_TABLET | Freq: Once | ORAL | Status: AC
  Administered 2022-12-05: 20 meq via ORAL
  Filled 2022-12-05: qty 1

## 2022-12-05 MED ORDER — PROPOFOL 10 MG/ML IV BOLUS
0.5000 mg/kg | Freq: Once | INTRAVENOUS | Status: DC
  Filled 2022-12-05: qty 20

## 2022-12-05 MED ORDER — HYDROCODONE-ACETAMINOPHEN 5-325 MG PO TABS: 1.0000 | ORAL_TABLET | Freq: Four times a day (QID) | ORAL | 0 refills | Status: DC | PRN

## 2022-12-05 MED ORDER — FENTANYL CITRATE PF 50 MCG/ML IJ SOSY
50.0000 ug | PREFILLED_SYRINGE | Freq: Once | INTRAMUSCULAR | Status: AC
  Administered 2022-12-05: 50 ug via INTRAVENOUS
  Filled 2022-12-05: qty 1

## 2022-12-05 MED ORDER — LACTATED RINGERS IV BOLUS
1000.0000 mL | Freq: Once | INTRAVENOUS | Status: AC
  Administered 2022-12-05: 1000 mL via INTRAVENOUS

## 2022-12-05 MED ORDER — HYDROCODONE-ACETAMINOPHEN 5-325 MG PO TABS
1.0000 | ORAL_TABLET | Freq: Once | ORAL | Status: AC
  Administered 2022-12-05: 1 via ORAL
  Filled 2022-12-05: qty 1
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
## 2022-12-05 NOTE — Discharge Instructions (Addendum)
Your trauma imaging was negative except for a wrist fracture, treated with reduction in the ED and splinted. Norco was been provided for pain control. Please schedule an appointment with Dr. Frazier Butt of Orthopedics for outpatient management of your wrist fracture.

## 2022-12-05 NOTE — ED Notes (Signed)
Pt able to ambulate to restroom without assistance
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
## 2022-12-05 NOTE — ED Notes (Signed)
Pt in radiology 

## 2022-12-20 ENCOUNTER — Encounter: Payer: Self-pay | Admitting: Internal Medicine

## 2022-12-20 ENCOUNTER — Ambulatory Visit (INDEPENDENT_AMBULATORY_CARE_PROVIDER_SITE_OTHER): Payer: 59 | Admitting: Internal Medicine

## 2022-12-20 VITALS — BP 138/84 | HR 64 | Temp 98.0°F | Resp 16 | Ht <= 58 in | Wt 91.0 lb

## 2022-12-20 DIAGNOSIS — M8000XA Age-related osteoporosis with current pathological fracture, unspecified site, initial encounter for fracture: Secondary | ICD-10-CM

## 2022-12-20 DIAGNOSIS — E559 Vitamin D deficiency, unspecified: Secondary | ICD-10-CM

## 2022-12-20 DIAGNOSIS — I10 Essential (primary) hypertension: Secondary | ICD-10-CM | POA: Diagnosis not present

## 2022-12-20 DIAGNOSIS — Z1211 Encounter for screening for malignant neoplasm of colon: Secondary | ICD-10-CM | POA: Insufficient documentation

## 2022-12-20 DIAGNOSIS — N1832 Chronic kidney disease, stage 3b: Secondary | ICD-10-CM

## 2022-12-20 DIAGNOSIS — E2839 Other primary ovarian failure: Secondary | ICD-10-CM | POA: Diagnosis not present

## 2022-12-20 LAB — PHOSPHORUS: Phosphorus: 3.7 mg/dL (ref 2.3–4.6)

## 2022-12-20 LAB — BASIC METABOLIC PANEL
BUN: 8 mg/dL (ref 6–23)
CO2: 29 mEq/L (ref 19–32)
Calcium: 9.6 mg/dL (ref 8.4–10.5)
Chloride: 102 mEq/L (ref 96–112)
Creatinine, Ser: 0.72 mg/dL (ref 0.40–1.20)
GFR: 84.74 mL/min (ref >60.00)
Glucose, Bld: 100 mg/dL — ABNORMAL HIGH (ref 70–99)
Potassium: 3.5 mEq/L (ref 3.5–5.1)
Sodium: 140 mEq/L (ref 135–145)

## 2022-12-20 LAB — MAGNESIUM: Magnesium: 1.7 mg/dL (ref 1.5–2.5)
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
## 2022-12-20 NOTE — Patient Instructions (Signed)

## 2022-12-21 ENCOUNTER — Encounter: Payer: Self-pay | Admitting: Internal Medicine

## 2022-12-21 DIAGNOSIS — E559 Vitamin D deficiency, unspecified: Secondary | ICD-10-CM | POA: Insufficient documentation

## 2022-12-21 DIAGNOSIS — E2839 Other primary ovarian failure: Secondary | ICD-10-CM | POA: Insufficient documentation

## 2022-12-21 DIAGNOSIS — N1832 Chronic kidney disease, stage 3b: Secondary | ICD-10-CM | POA: Insufficient documentation

## 2022-12-21 LAB — VITAMIN D 25 HYDROXY (VIT D DEFICIENCY, FRACTURES): VITD: 23.12 ng/mL — ABNORMAL LOW (ref 30.00–100.00)

## 2022-12-21 MED ORDER — CHOLECALCIFEROL 125 MCG (5000 UT) PO CAPS
5000.0000 [IU] | ORAL_CAPSULE | Freq: Every day | ORAL | 1 refills | Status: DC
Start: 2022-12-21 — End: 2023-12-12

## 2022-12-22 ENCOUNTER — Encounter: Payer: Self-pay | Admitting: Internal Medicine

## 2023-01-11 ENCOUNTER — Telehealth: Payer: Self-pay

## 2023-01-11 ENCOUNTER — Ambulatory Visit: Payer: 59

## 2023-01-11 ENCOUNTER — Telehealth: Payer: Self-pay | Admitting: Internal Medicine

## 2023-01-11 NOTE — Telephone Encounter (Signed)
Patient had AWV today 01/11/2023 at 11:30, but was in the bathroom when they called. She couldn't understand the callback number they left. Please advise. Best callback is [phone].

## 2023-01-11 NOTE — Telephone Encounter (Signed)
Unsuccessful attempt to reach patient on preferred number listed in notes for scheduled AWV. Left message on voicemail okay to reschedule. 

## 2023-01-25 NOTE — Progress Notes (Deleted)
01/25/2023 Amber Griffith [id number] 1951/01/09   CHIEF COMPLAINT: Discuss scheduling a colonoscopy   HISTORY OF PRESENT ILLNESS: Amber Griffith is a 72 year old female with a past medical history of anxiety, depression, PAD on Xarelto, COPD, GERD    EGD 01/08/2020: Mild gastritis, biopsied to check for H. pylori.  The esophagus and GE junction were normal. Biopsies taken from distal (jar 2) and proximal (jar 3) esophagus to check for EoE.  The examination was otherwise normal 1. Surgical [P], gastric antrum and gastric body - ANTRAL MUCOSA WITH HYPEREMIA AND SLIGHT CHRONIC INFLAMMATION. [place] NEGATIVE FOR HELICOBACTER PYLORI. - NO INTESTINAL METAPLASIA, DYSPLASIA OR CARCINOMA. 2. Surgical [P], distal esophagus - BENIGN SQUAMOUS MUCOSA. - NO EOSINOPHILIC ESOPHAGITIS (LESS THAN 5 PER HIGH POWER FIELD). 3. Surgical [P], proximal esophagus - BENIGN SQUAMOUS MUCOSA. - NO EOSINOPHILIC ESOPHAGITIS (LESS THAN 5 PER HIGH POWER FIELD).  Colonoscopy 06/16/2012: Normal colonoscopy. No polyps. Medium to large external hemorrhoids  10 year recall colonoscopy   Colonoscopy 06/23/2007: Normal colonoscopy. No polyps. Internal and external hemorrhoids.    Past Medical History:  Diagnosis Date   ALLERGIC RHINITIS 09/01/2007   Anxiety and depression 12/24/2010   Cardiomegaly 01/17/2009   CONSTIPATION, CHRONIC 12/18/2008   COPD 04/04/2007   ELECTROCARDIOGRAM, ABNORMAL 01/17/2009   GERD 04/04/2007   HEADACHE, TENSION 04/18/2008   HEMORRHOIDS 09/25/2007   HYPERTENSION 06/12/2007   SINUSITIS- ACUTE-NOS 05/08/2010   TOBACCO ABUSE 04/04/2007   UTI 09/30/2010   Vaginitis and vulvovaginitis, unspecified 09/30/2010   Past Surgical History:  Procedure Laterality Date   AMPUTATION Right 09/23/2017   Procedure: REVISION AMPUTATION RIGHT INDEX FINGER;  Surgeon: Betha Loa, MD;  Location: [facility];  Service: Orthopedics;  Laterality: Right;   BREAST BIOPSY Left     TONSILLECTOMY     TUBAL LIGATION     UMBILICAL HERNIA REPAIR     Social History:  Family History:    reports that she has been smoking cigarettes. She has a 35.00 pack-year smoking history. She has been exposed to tobacco smoke. She has never used smokeless tobacco. She reports current alcohol use of about 21.0 standard drinks of alcohol per week. She reports that she does not use drugs. family history includes Asthma in her mother; COPD in her maternal uncle; Hypertension in her father and mother; Seizures in her father; Stroke in her father and paternal uncle.  No Known Allergies    Outpatient Encounter Medications as of 01/26/2023  Medication Sig   albuterol (VENTOLIN HFA) 108 (90 Base) MCG/ACT inhaler Inhale 2 puffs into the lungs every 6 (six) hours as needed for wheezing or shortness of breath.   aspirin EC (ASPIRIN LOW DOSE) 81 MG tablet Take 1 tablet (81 mg total) by mouth 5 (five) times daily. SWALLOW WHOLE.   Cholecalciferol 125 MCG (5000 UT) capsule Take 1 capsule (5,000 Units total) by mouth daily.   [employer] HEARTBURN RELIEF EX ST 254-237.5 MG/5ML SUSP TAKE 5 MLS BY MOUTH 3 (THREE) TIMES DAILY AS NEEDED.   Fluticasone-Umeclidin-Vilant (TRELEGY ELLIPTA) 100-62.5-25 MCG/ACT AEPB Inhale 1 puff into the lungs daily.   HYDROcodone-acetaminophen (NORCO/VICODIN) 5-325 MG tablet Take 1-2 tablets by mouth every 6 (six) hours as needed.   nebivolol (BYSTOLIC) 5 MG tablet Take 1 tablet (5 mg total) by mouth daily.   omeprazole (PRILOSEC) 40 MG capsule Take 1 capsule (40 mg total) by mouth 2 (two) times daily before a meal.   rivaroxaban (XARELTO) 2.5 MG TABS tablet Take  1 tablet (2.5 mg total) by mouth 2 (two) times daily.   rosuvastatin (CRESTOR) 5 MG tablet Take 1 tablet (5 mg total) by mouth daily.   triamterene-hydrochlorothiazide (DYAZIDE) 37.5-25 MG capsule Take 1 each (1 capsule total) by mouth daily.   No facility-administered encounter medications on file as of 01/26/2023.      REVIEW OF SYSTEMS:  Gen: Denies fever, sweats or chills. No weight loss.  CV: Denies chest pain, palpitations or edema. Resp: Denies cough, shortness of breath of hemoptysis.  GI: Denies heartburn, dysphagia, stomach or lower abdominal pain. No diarrhea or constipation.  GU : Denies urinary burning, blood in urine, increased urinary frequency or incontinence. MS: Denies joint pain, muscles aches or weakness. Derm: Denies rash, itchiness, skin lesions or unhealing ulcers. Psych: Denies depression, anxiety, memory loss or confusion. Heme: Denies bruising, easy bleeding. Neuro:  Denies headaches, dizziness or paresthesias. Endo:  Denies any problems with DM, thyroid or adrenal function.  PHYSICAL EXAM: There were no vitals taken for this visit. General: in no acute distress. Head: Normocephalic and atraumatic. Eyes:  Sclerae non-icteric, conjunctive pink. Ears: Normal auditory acuity. Mouth: Dentition intact. No ulcers or lesions.  Neck: Supple, no lymphadenopathy or thyromegaly.  Lungs: Clear bilaterally to auscultation without wheezes, crackles or rhonchi. Heart: Regular rate and rhythm. No murmur, rub or gallop appreciated.  Abdomen: Soft, nontender, nondistended. No masses. No hepatosplenomegaly. Normoactive bowel sounds x 4 quadrants.  Rectal:  Musculoskeletal: Symmetrical with no gross deformities. Skin: Warm and dry. No rash or lesions on visible extremities. Extremities: No edema. Neurological: Alert oriented x 4, no focal deficits.  Psychological:  Alert and cooperative. Normal mood and affect.  ASSESSMENT AND PLAN:  PAD on Xarelto followed by vascular specialist Dr. Edilia Bo and Doreatha Massed PA-C  CC:  Etta Grandchild, MD

## 2023-01-26 ENCOUNTER — Ambulatory Visit: Payer: 59 | Admitting: Nurse Practitioner

## 2023-01-27 ENCOUNTER — Ambulatory Visit (INDEPENDENT_AMBULATORY_CARE_PROVIDER_SITE_OTHER): Payer: 59

## 2023-01-27 ENCOUNTER — Ambulatory Visit (INDEPENDENT_AMBULATORY_CARE_PROVIDER_SITE_OTHER): Payer: 59 | Admitting: Internal Medicine

## 2023-01-27 ENCOUNTER — Encounter: Payer: Self-pay | Admitting: Internal Medicine

## 2023-01-27 VITALS — BP 140/84 | HR 67 | Temp 98.0°F | Resp 20 | Ht <= 58 in | Wt 87.0 lb

## 2023-01-27 DIAGNOSIS — I10 Essential (primary) hypertension: Secondary | ICD-10-CM

## 2023-01-27 DIAGNOSIS — J441 Chronic obstructive pulmonary disease with (acute) exacerbation: Secondary | ICD-10-CM | POA: Insufficient documentation

## 2023-01-27 DIAGNOSIS — R051 Acute cough: Secondary | ICD-10-CM | POA: Insufficient documentation

## 2023-01-27 MED ORDER — AZITHROMYCIN 500 MG PO TABS
500.0000 mg | ORAL_TABLET | Freq: Every day | ORAL | 0 refills | Status: AC
Start: 2023-01-27 — End: 2023-01-30

## 2023-01-27 MED ORDER — PREDNISONE 50 MG PO TABS
50.0000 mg | ORAL_TABLET | Freq: Every day | ORAL | 0 refills | Status: DC
Start: 2023-01-27 — End: 2023-05-24

## 2023-01-27 NOTE — Patient Instructions (Signed)
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
## 2023-01-31 LAB — HM DEXA SCAN

## 2023-02-07 ENCOUNTER — Telehealth: Payer: Self-pay | Admitting: Pharmacy Technician

## 2023-02-07 NOTE — Telephone Encounter (Signed)
-----   Message from Etta Grandchild, MD sent at 02/02/2023  3:23 PM EDT ----- Will you see if she qualifies for evenity?

## 2023-02-07 NOTE — Telephone Encounter (Signed)
Evenity VOB initiated via [email or web address] 

## 2023-02-11 ENCOUNTER — Other Ambulatory Visit (HOSPITAL_COMMUNITY): Payer: Self-pay

## 2023-02-11 NOTE — Telephone Encounter (Signed)
Pharmacy Patient Advocate Encounter   Received notification from  [employer] that prior authorization for Evenity 105MG /1.17ML syringes is required/requested.   Insurance verification completed.   The patient is insured through [facility] .   ALENDRONATE SODIUM, FORTEO OR TERIPARATIDE, IBANDRONATE SODIUM, PROLIA, RISEDRONATE SODIUM, TYMLOS is preferred by the ins.  If suggested medication is appropriate, Please send in a new RX and discontinue this one. If not, please advise as to why it's not appropriate so that we may request a Prior Authorization.  PA submitted to [facility] via CoverMyMeds Key/confirmation #/[facility] Status is pending

## 2023-02-14 ENCOUNTER — Other Ambulatory Visit: Payer: Self-pay | Admitting: Internal Medicine

## 2023-02-14 DIAGNOSIS — I779 Disorder of arteries and arterioles, unspecified: Secondary | ICD-10-CM

## 2023-02-14 NOTE — Telephone Encounter (Signed)
Pharmacy Patient Advocate Encounter  Received notification from [facility] that Prior Authorization for Evenity has been DENIED because Patient has not tried 4 of the covered drugs - Alendronate, Forteo/Teriparatide, Ibandronate, Risedronate, Tymlos..  PA #/Case ID/Reference #:  [id number]   Please be advised we currently do not have a Pharmacist to review denials, therefore you will need to process appeals accordingly as needed. Thanks for your support at this time. Contact for appeals are as follows: Phone: [phone], Fax: [phone]

## 2023-02-15 ENCOUNTER — Other Ambulatory Visit: Payer: Self-pay | Admitting: Internal Medicine

## 2023-02-15 DIAGNOSIS — M8000XA Age-related osteoporosis with current pathological fracture, unspecified site, initial encounter for fracture: Secondary | ICD-10-CM

## 2023-03-22 ENCOUNTER — Ambulatory Visit: Payer: 59 | Admitting: Internal Medicine

## 2023-04-08 ENCOUNTER — Other Ambulatory Visit: Payer: 59

## 2023-04-13 ENCOUNTER — Ambulatory Visit (INDEPENDENT_AMBULATORY_CARE_PROVIDER_SITE_OTHER): Payer: 59

## 2023-04-13 VITALS — Ht <= 58 in | Wt 89.0 lb

## 2023-04-13 DIAGNOSIS — Z0000 Encounter for general adult medical examination without abnormal findings: Secondary | ICD-10-CM

## 2023-04-13 DIAGNOSIS — Z1211 Encounter for screening for malignant neoplasm of colon: Secondary | ICD-10-CM | POA: Diagnosis not present

## 2023-04-13 NOTE — Patient Instructions (Addendum)
Ms. Maxted , Thank you for taking time to come for your Medicare Wellness Visit. I appreciate your ongoing commitment to your health goals. Please review the following plan we discussed and let me know if I can assist you in the future.   Referrals/Orders/Follow-Ups/Clinician Recommendations: Yes; [facility] for Screening Colonoscopy Consult.  This is a list of the screening recommended for you and due dates:  Health Maintenance  Topic Date Due   Zoster (Shingles) Vaccine (1 of 2) Never done   Colon Cancer Screening  06/16/2022   Flu Shot  03/03/2023   COVID-19 Vaccine (5 - 2023-24 season) 04/03/2023   Screening for Lung Cancer  08/06/2023   Medicare Annual Wellness Visit  04/12/2024   Mammogram  08/17/2024   Pneumonia Vaccine  Completed   DEXA scan (bone density measurement)  Completed   Hepatitis C Screening  Completed   HPV Vaccine  Aged Out   DTaP/Tdap/Td vaccine  Discontinued    Advanced directives: (Declined) Advance directive discussed with you today. Even though you declined this today, please call our office should you change your mind, and we can give you the proper paperwork for you to fill out.  Next Medicare Annual Wellness Visit scheduled for next year: Yes; It was nice speaking with you today!  Your next scheduled Medicare Wellness Visit is scheduled with Nurse Percell Miller, via telephone on 04/16/2024 at 2:30 p.m. If you need to cancel or reschedule please call [phone].

## 2023-04-13 NOTE — Progress Notes (Signed)
Subjective:   Amber Griffith is a 72 y.o. female who presents for an Initial Medicare Annual Wellness Visit.  Visit Complete: Virtual  I connected with  Hermina Staggers on 04/13/23 by a audio enabled telemedicine application and verified that I am speaking with the correct person using two identifiers.  Patient Location: Home  Provider Location: Office/Clinic  I discussed the limitations of evaluation and management by telemedicine. The patient expressed understanding and agreed to proceed.  Vital Signs: Because this visit was a virtual/telehealth visit, some criteria may be missing or patient reported. Any vitals not documented were not able to be obtained and vitals that have been documented are patient reported.   Patient provided his/her weight during this virtual phone visit.  Review of Systems     Cardiac Risk Factors include: advanced age (>30men, >72 women);dyslipidemia;family history of premature cardiovascular disease;hypertension;sedentary lifestyle;smoking/ tobacco exposure     Objective:    Today's Vitals   04/13/23 1433  Weight: 89 lb (40.4 kg)  Height: 4\' 10"  (1.473 m)  PainSc: 0-No pain   Body mass index is 18.6 kg/m.     04/13/2023    2:35 PM 09/23/2017    2:51 PM 09/23/2017   11:23 AM  Advanced Directives  Does Patient Have a Medical Advance Directive? No No No  Would patient like information on creating a medical advance directive? No - Patient declined  No - Patient declined    Current Medications (verified) Outpatient Encounter Medications as of 04/13/2023  Medication Sig   albuterol (VENTOLIN HFA) 108 (90 Base) MCG/ACT inhaler Inhale 2 puffs into the lungs every 6 (six) hours as needed for wheezing or shortness of breath.   aspirin EC 81 MG tablet Take 1 tablet (81 mg total) by mouth daily. SWALLOW WHOLE.   Cholecalciferol 125 MCG (5000 UT) capsule Take 1 capsule (5,000 Units total) by mouth daily.   [employer] HEARTBURN RELIEF EX ST 254-237.5  MG/5ML SUSP TAKE 5 MLS BY MOUTH 3 (THREE) TIMES DAILY AS NEEDED.   Fluticasone-Umeclidin-Vilant (TRELEGY ELLIPTA) 100-62.5-25 MCG/ACT AEPB Inhale 1 puff into the lungs daily.   HYDROcodone-acetaminophen (NORCO/VICODIN) 5-325 MG tablet Take 1-2 tablets by mouth every 6 (six) hours as needed.   nebivolol (BYSTOLIC) 5 MG tablet Take 1 tablet (5 mg total) by mouth daily.   omeprazole (PRILOSEC) 40 MG capsule Take 1 capsule (40 mg total) by mouth 2 (two) times daily before a meal.   predniSONE (DELTASONE) 50 MG tablet Take 1 tablet (50 mg total) by mouth daily with breakfast.   rivaroxaban (XARELTO) 2.5 MG TABS tablet Take 1 tablet (2.5 mg total) by mouth 2 (two) times daily.   rosuvastatin (CRESTOR) 5 MG tablet Take 1 tablet (5 mg total) by mouth daily.   triamterene-hydrochlorothiazide (DYAZIDE) 37.5-25 MG capsule Take 1 each (1 capsule total) by mouth daily.   No facility-administered encounter medications on file as of 04/13/2023.    Allergies (verified) Patient has no known allergies.   History: Past Medical History:  Diagnosis Date   ALLERGIC RHINITIS 09/01/2007   Anxiety and depression 12/24/2010   Cardiomegaly 01/17/2009   CONSTIPATION, CHRONIC 12/18/2008   COPD 04/04/2007   ELECTROCARDIOGRAM, ABNORMAL 01/17/2009   GERD 04/04/2007   HEADACHE, TENSION 04/18/2008   HEMORRHOIDS 09/25/2007   HYPERTENSION 06/12/2007   SINUSITIS- ACUTE-NOS 05/08/2010   TOBACCO ABUSE 04/04/2007   UTI 09/30/2010   Vaginitis and vulvovaginitis, unspecified 09/30/2010   Past Surgical History:  Procedure Laterality Date   AMPUTATION Right 09/23/2017  Procedure: REVISION AMPUTATION RIGHT INDEX FINGER;  Surgeon: Betha Loa, MD;  Location: [facility];  Service: Orthopedics;  Laterality: Right;   BREAST BIOPSY Left    TONSILLECTOMY     TUBAL LIGATION     UMBILICAL HERNIA REPAIR     Family History  Problem Relation Age of Onset   Asthma Mother    Hypertension Mother    Stroke Father    Hypertension  Father    Seizures Father    COPD Maternal Uncle    Stroke Paternal Uncle    Colon cancer Neg Hx    Esophageal cancer Neg Hx    Stomach cancer Neg Hx    Rectal cancer Neg Hx    Breast cancer Neg Hx    Social History   Socioeconomic History   Marital status: Divorced    Spouse name: Not on file   Number of children: 1   Years of education: 12   Highest education level: High school graduate  Occupational History   Occupation: custodial    Employer: [employer]  Tobacco Use   Smoking status: Every Day    Current packs/day: 1.00    Average packs/day: 1 pack/day for 35.0 years (35.0 ttl pk-yrs)    Types: Cigarettes    Passive exposure: Current   Smokeless tobacco: Never  Vaping Use   Vaping status: Never Used  Substance and Sexual Activity   Alcohol use: Yes    Alcohol/week: 21.0 standard drinks of alcohol    Types: 21 Cans of beer per week    Comment: 4 beer per day   Drug use: No   Sexual activity: Not Currently    Partners: Male  Other Topics Concern   Not on file  Social History Narrative   No regular exercise.   Social Determinants of Health   Financial Resource Strain: Low Risk  (04/13/2023)   Overall Financial Resource Strain (CARDIA)    Difficulty of Paying Living Expenses: Not hard at all  Food Insecurity: No Food Insecurity (04/13/2023)   Hunger Vital Sign    Worried About Running Out of Food in the Last Year: Never true    Ran Out of Food in the Last Year: Never true  Transportation Needs: No Transportation Needs (04/13/2023)   PRAPARE - Administrator, Civil Service (Medical): No    Lack of Transportation (Non-Medical): No  Physical Activity: Inactive (04/13/2023)   Exercise Vital Sign    Days of Exercise per Week: 0 days    Minutes of Exercise per Session: 0 min  Stress: No Stress Concern Present (04/13/2023)   [employer] of Occupational Health - Occupational Stress Questionnaire    Feeling of Stress : Not at all  Social  Connections: Unknown (04/13/2023)   Social Connection and Isolation Panel [NHANES]    Frequency of Communication with Friends and Family: More than three times a week    Frequency of Social Gatherings with Friends and Family: More than three times a week    Attends Religious Services: Not on Marketing executive or Organizations: Yes    Attends Engineer, structural: More than 4 times per year    Marital Status: Divorced    Tobacco Counseling Ready to quit: Not Answered Counseling given: Not Answered   Clinical Intake:  Pre-visit preparation completed: Yes  Pain : No/denies pain Pain Score: 0-No pain     BMI - recorded: 18.6 Nutritional Status: BMI <19  Underweight Nutritional Risks: None Diabetes: No  How often do you need to have someone help you when you read instructions, pamphlets, or other written materials from your doctor or pharmacy?: 1 - Never What is the last grade level you completed in school?: HSG  Interpreter Needed?: No  Information entered by :: Earnesteen Birnie N. Janus Vlcek, LPN.   Activities of Daily Living    04/13/2023    2:36 PM  In your present state of health, do you have any difficulty performing the following activities:  Hearing? 0  Vision? 0  Difficulty concentrating or making decisions? 0  Walking or climbing stairs? 0  Dressing or bathing? 0  Doing errands, shopping? 0  Preparing Food and eating ? N  Using the Toilet? N  In the past six months, have you accidently leaked urine? N  Do you have problems with loss of bowel control? N  Managing your Medications? N  Managing your Finances? N  Housekeeping or managing your Housekeeping? N    Patient Care Team: Etta Grandchild, MD as PCP - General  Indicate any recent Medical Services you may have received from other than [facility] providers in the past year (date may be approximate).     Assessment:   This is a routine wellness examination for Marykathryn.  Hearing/Vision  screen Hearing Screening - Comments:: Patient denied any hearing difficulty.   No hearing aids.  Vision Screening - Comments:: Patient does wear otc readers.  Eye exam done by: No    Goals Addressed             This Visit's Progress    Client understands the importance of follow-up with providers by attending scheduled visits        Depression Screen    04/13/2023    2:37 PM 09/13/2022    9:11 AM 08/30/2022    3:28 PM 09/18/2021   10:38 AM 01/21/2021    1:25 PM 11/19/2019    1:43 PM 08/17/2018    4:02 PM  PHQ 2/9 Scores  PHQ - 2 Score 0 1 0 0 0 0 0  PHQ- 9 Score 0     1 2    Fall Risk    04/13/2023    2:35 PM 08/30/2022    3:28 PM 09/18/2021   10:38 AM 01/21/2021    1:25 PM 06/16/2020    2:36 PM  Fall Risk   Falls in the past year? 0 0 0 0 0  Number falls in past yr: 0 0 0 0 0  Injury with Fall? 0 0 0 0 0  Risk for fall due to : No Fall Risks No Fall Risks No Fall Risks  No Fall Risks  Follow up Falls prevention discussed Falls evaluation completed Falls evaluation completed  Falls evaluation completed    MEDICARE RISK AT HOME: Medicare Risk at Home If so, are there any without handrails?: No Home free of loose throw rugs in walkways, pet beds, electrical cords, etc?: Yes Adequate lighting in your home to reduce risk of falls?: Yes Life alert?: No Use of a cane, walker or w/c?: No Grab bars in the bathroom?: No Shower chair or bench in shower?: No Elevated toilet seat or a handicapped toilet?: No  TIMED UP AND GO:  Was the test performed? No    Cognitive Function:        04/13/2023    2:36 PM  6CIT Screen  What Year? 0 points  What month? 0 points  What time?  0 points  Count back from 20 0 points  Months in reverse 0 points  Repeat phrase 0 points  Total Score 0 points    Immunizations Immunization History  Administered Date(s) Administered   Fluad Quad(high Dose 65+) 06/08/2019, 06/16/2020, 04/13/2021, 05/11/2022   Influenza Split 06/04/2011,  06/09/2019   Influenza Whole 05/22/2007, 05/08/2010   Influenza, High Dose Seasonal PF 07/07/2018   Influenza,inj,Quad PF,6+ Mos 05/02/2013, 05/02/2014, 07/04/2015, 08/30/2016   PFIZER(Purple Top)SARS-COV-2 Vaccination 10/15/2019, 11/06/2019, 08/01/2020   Pfizer Covid-19 Vaccine Bivalent Booster 56yrs & up 05/30/2021   Pneumococcal Conjugate-13 01/25/2018   Pneumococcal Polysaccharide-23 08/20/2005, 06/16/2020   Tdap 02/01/2011    TDAP status: Due, Education has been provided regarding the importance of this vaccine. Advised may receive this vaccine at local pharmacy or Health Dept. Aware to provide a copy of the vaccination record if obtained from local pharmacy or Health Dept. Verbalized acceptance and understanding.  Flu Vaccine status: Due, Education has been provided regarding the importance of this vaccine. Advised may receive this vaccine at local pharmacy or Health Dept. Aware to provide a copy of the vaccination record if obtained from local pharmacy or Health Dept. Verbalized acceptance and understanding.  Pneumococcal vaccine status: Up to date  Covid-19 vaccine status: Completed vaccines  Qualifies for Shingles Vaccine? Yes   Zostavax completed No   Shingrix Completed?: No.    Education has been provided regarding the importance of this vaccine. Patient has been advised to call insurance company to determine out of pocket expense if they have not yet received this vaccine. Advised may also receive vaccine at local pharmacy or Health Dept. Verbalized acceptance and understanding.  Screening Tests Health Maintenance  Topic Date Due   Zoster Vaccines- Shingrix (1 of 2) Never done   Colonoscopy  06/16/2022   INFLUENZA VACCINE  03/03/2023   COVID-19 Vaccine (5 - 2023-24 season) 04/03/2023   Lung Cancer Screening  08/06/2023   Medicare Annual Wellness (AWV)  04/12/2024   MAMMOGRAM  08/17/2024   Pneumonia Vaccine 46+ Years old  Completed   DEXA SCAN  Completed   Hepatitis C  Screening  Completed   HPV VACCINES  Aged Out   DTaP/Tdap/Td  Discontinued    Health Maintenance  Health Maintenance Due  Topic Date Due   Zoster Vaccines- Shingrix (1 of 2) Never done   Colonoscopy  06/16/2022   INFLUENZA VACCINE  03/03/2023   COVID-19 Vaccine (5 - 2023-24 season) 04/03/2023    Colorectal cancer screening: Referral to GI placed 04/13/2023. Pt aware the office will call re: appt.  Mammogram status: Completed 08/17/2022. Repeat every year  Bone Density status: Completed 01/31/2023. Results reflect: Bone density results: OSTEOPOROSIS. Repeat every 2 years.  Lung Cancer Screening: (Low Dose CT Chest recommended if Age 23-80 years, 20 pack-year currently smoking OR have quit w/in 15years.) does qualify.   Lung Cancer Screening Referral: ordered 08/09/2022  Additional Screening:  Hepatitis C Screening: does qualify; Completed 08/17/2018  Vision Screening: Recommended annual ophthalmology exams for early detection of glaucoma and other disorders of the eye. Is the patient up to date with their annual eye exam?  No  Who is the provider or what is the name of the office in which the patient attends annual eye exams? Patient declined If pt is not established with a provider, would they like to be referred to a provider to establish care? No .   Dental Screening: Recommended annual dental exams for proper oral hygiene  Diabetic Foot Exam: N/A  Community Resource Referral / Chronic Care Management: CRR required this visit?  No   CCM required this visit?  No     Plan:     I have personally reviewed and noted the following in the patient's chart:   Medical and social history Use of alcohol, tobacco or illicit drugs  Current medications and supplements including opioid prescriptions. Patient is currently taking opioid prescriptions. Information provided to patient regarding non-opioid alternatives. Patient advised to discuss non-opioid treatment plan with their  provider. Functional ability and status Nutritional status Physical activity Advanced directives List of other physicians Hospitalizations, surgeries, and ER visits in previous 12 months Vitals Screenings to include cognitive, depression, and falls Referrals and appointments  In addition, I have reviewed and discussed with patient certain preventive protocols, quality metrics, and best practice recommendations. A written personalized care plan for preventive services as well as general preventive health recommendations were provided to patient.     Mickeal Needy, LPN   [id number]/61/0960   After Visit Summary: (Mail) Due to this being a telephonic visit, the after visit summary with patients personalized plan was offered to patient via mail   Nurse Notes: Normal cognitive status assessed by direct observation via telephone conversation by this Nurse Health Advisor. No abnormalities found.  Patient provided his/her weight during this virtual phone visit.

## 2023-04-19 ENCOUNTER — Encounter: Payer: Self-pay | Admitting: Internal Medicine

## 2023-04-19 ENCOUNTER — Other Ambulatory Visit: Payer: Self-pay | Admitting: Internal Medicine

## 2023-04-19 ENCOUNTER — Ambulatory Visit (INDEPENDENT_AMBULATORY_CARE_PROVIDER_SITE_OTHER): Payer: 59 | Admitting: Internal Medicine

## 2023-04-19 VITALS — BP 138/70 | HR 68 | Temp 98.0°F | Ht <= 58 in | Wt 90.0 lb

## 2023-04-19 DIAGNOSIS — I10 Essential (primary) hypertension: Secondary | ICD-10-CM | POA: Diagnosis not present

## 2023-04-19 DIAGNOSIS — G8929 Other chronic pain: Secondary | ICD-10-CM

## 2023-04-19 DIAGNOSIS — Z23 Encounter for immunization: Secondary | ICD-10-CM

## 2023-04-19 DIAGNOSIS — R519 Headache, unspecified: Secondary | ICD-10-CM | POA: Diagnosis not present

## 2023-04-19 DIAGNOSIS — N1832 Chronic kidney disease, stage 3b: Secondary | ICD-10-CM | POA: Diagnosis not present

## 2023-04-19 LAB — BASIC METABOLIC PANEL
BUN: 21 mg/dL (ref 6–23)
CO2: 32 meq/L (ref 19–32)
Calcium: 9.5 mg/dL (ref 8.4–10.5)
Chloride: 102 meq/L (ref 96–112)
Creatinine, Ser: 0.98 mg/dL (ref 0.40–1.20)
GFR: 58.4 mL/min — ABNORMAL LOW (ref >60.00)
Glucose, Bld: 72 mg/dL (ref 70–99)
Potassium: 3.5 meq/L (ref 3.5–5.1)
Sodium: 140 meq/L (ref 135–145)

## 2023-04-19 LAB — CBC WITH DIFFERENTIAL/PLATELET
Basophils Absolute: 0 10*3/uL (ref 0.0–0.1)
Basophils Relative: 0.4 % (ref 0.0–3.0)
Eosinophils Absolute: 0.1 10*3/uL (ref 0.0–0.7)
Eosinophils Relative: 1.1 % (ref 0.0–5.0)
HCT: 39.8 % (ref 36.0–46.0)
Hemoglobin: 12.8 g/dL (ref 12.0–15.0)
Lymphocytes Relative: 35.2 % (ref 12.0–46.0)
Lymphs Abs: 2.4 10*3/uL (ref 0.7–4.0)
MCHC: 32.1 g/dL (ref 30.0–36.0)
MCV: 91 fl (ref 78.0–100.0)
Monocytes Absolute: 0.8 10*3/uL (ref 0.1–1.0)
Monocytes Relative: 11 % (ref 3.0–12.0)
Neutro Abs: 3.6 10*3/uL (ref 1.4–7.7)
Neutrophils Relative %: 52.3 % (ref 43.0–77.0)
Platelets: 244 10*3/uL (ref 150.0–400.0)
RBC: 4.38 Mil/uL (ref 3.87–5.11)
RDW: 13.4 % (ref 11.5–15.5)
WBC: 6.9 10*3/uL (ref 4.0–10.5)

## 2023-04-19 LAB — SEDIMENTATION RATE: Sed Rate: 13 mm/h (ref 0–30)
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
## 2023-04-19 NOTE — Patient Instructions (Signed)
General Headache Without Cause A headache is pain or discomfort you feel around the head or neck area. There are many causes and types of headaches. In some cases, the cause may not be found. Follow these instructions at home: Watch your condition for any changes. Let your doctor know about them. Take these steps to help with your condition: Managing pain     Take over-the-counter and prescription medicines only as told by your doctor. This includes medicines for pain that are taken by mouth or put on the skin. Lie down in a dark, quiet room when you have a headache. If told, put ice on your head and neck area: Put ice in a plastic bag. Place a towel between your skin and the bag. Leave the ice on for 20 minutes, 2-3 times per day. Take off the ice if your skin turns bright red. This is very important. If you cannot feel pain, heat, or cold, you have a greater risk of damage to the area. If told, put heat on the affected area. Use the heat source that your doctor recommends, such as a moist heat pack or a heating pad. Place a towel between your skin and the heat source. Leave the heat on for 20-30 minutes. Take off the heat if your skin turns bright red. This is very important. If you cannot feel pain, heat, or cold, you have a greater risk of getting burned. Keep lights dim if bright lights bother you or make your headaches worse. Eating and drinking Eat meals on a regular schedule. If you drink alcohol: Limit how much you have to: 0-1 drink a day for women who are not pregnant. 0-2 drinks a day for men. Know how much alcohol is in a drink. In the [place] one drink equals one 12 oz bottle of beer (355 mL), one 5 oz glass of wine (148 mL), or one 1 oz glass of hard liquor (44 mL). Stop drinking caffeine, or drink less caffeine. General instructions  Keep a journal to find out if certain things bring on headaches. For example, write down: What you eat and drink. How much sleep you  get. Any change to your diet or medicines. Get a massage or try other ways to relax. Limit stress. Sit up straight. Do not tighten (tense) your muscles. Do not smoke or use any products that contain nicotine or tobacco. If you need help quitting, ask your doctor. Exercise regularly as told by your doctor. Get enough sleep. This often means 7-9 hours of sleep each night. Keep all follow-up visits. This is important. Contact a doctor if: Medicine does not help your symptoms. You have a headache that feels different than the other headaches. You feel like you may vomit (nauseous) or you vomit. You have a fever. Get help right away if: Your headache: Gets very bad quickly. Gets worse after a lot of physical activity. You have any of these symptoms: You continue to vomit. A stiff neck. Trouble seeing. Your eye or ear hurts. Trouble speaking. Weak muscles or you lose muscle control. You lose your balance or have trouble walking. You feel like you will pass out (faint) or you pass out. You are mixed up (confused). You have a seizure. These symptoms may be an emergency. Get help right away. Call your local emergency services (911 in the [place] Do not wait to see if the symptoms will go away. Do not drive yourself to the hospital. Summary A headache is pain or discomfort that  is felt around the head or neck area. There are many causes and types of headaches. In some cases, the cause may not be found. Keep a journal to help find out what causes your headaches. Watch your condition for any changes. Let your doctor know about them. Contact a doctor if you have a headache that is different from usual, or if medicine does not help your headache. Get help right away if your headache gets very bad, you throw up, you have trouble seeing, you lose your balance, or you have a seizure. This information is not intended to replace advice given to you by your health care provider. Make sure you  discuss any questions you have with your health care provider. Document Revised: 12/17/2020 Document Reviewed: 12/17/2020 Elsevier Patient Education  2024 [employer].

## 2023-04-21 LAB — LAB REPORT - SCANNED
Albumin, Urine POC: 3
Creatinine, POC: 103.1 mg/dL
Microalb Creat Ratio: 3

## 2023-04-28 ENCOUNTER — Ambulatory Visit: Payer: 59 | Admitting: Internal Medicine

## 2023-05-02 ENCOUNTER — Ambulatory Visit
Admission: RE | Admit: 2023-05-02 | Discharge: 2023-05-02 | Disposition: A | Discharge status: Home / Self Care | Payer: 59 | Source: Ambulatory Visit | Attending: Internal Medicine | Admitting: Internal Medicine

## 2023-05-02 DIAGNOSIS — E2839 Other primary ovarian failure: Secondary | ICD-10-CM

## 2023-05-17 ENCOUNTER — Ambulatory Visit: Payer: 59 | Admitting: Internal Medicine

## 2023-05-17 ENCOUNTER — Encounter: Payer: Self-pay | Admitting: Internal Medicine

## 2023-05-17 VITALS — BP 142/78 | HR 60 | Temp 98.0°F | Resp 16 | Ht <= 58 in | Wt 89.0 lb

## 2023-05-17 DIAGNOSIS — Z23 Encounter for immunization: Secondary | ICD-10-CM | POA: Insufficient documentation

## 2023-05-17 DIAGNOSIS — I10 Essential (primary) hypertension: Secondary | ICD-10-CM | POA: Diagnosis not present

## 2023-05-17 DIAGNOSIS — M8000XA Age-related osteoporosis with current pathological fracture, unspecified site, initial encounter for fracture: Secondary | ICD-10-CM | POA: Diagnosis not present

## 2023-05-17 DIAGNOSIS — E7800 Pure hypercholesterolemia, unspecified: Secondary | ICD-10-CM | POA: Diagnosis not present

## 2023-05-17 DIAGNOSIS — N1832 Chronic kidney disease, stage 3b: Secondary | ICD-10-CM | POA: Diagnosis not present

## 2023-05-17 DIAGNOSIS — J4489 Other specified chronic obstructive pulmonary disease: Secondary | ICD-10-CM

## 2023-05-17 LAB — URINALYSIS, ROUTINE W REFLEX MICROSCOPIC
Bilirubin Urine: NEGATIVE
Ketones, ur: NEGATIVE
Leukocytes,Ua: NEGATIVE
Nitrite: NEGATIVE
Specific Gravity, Urine: 1.01 (ref 1.000–1.030)
Total Protein, Urine: NEGATIVE
Urine Glucose: NEGATIVE
Urobilinogen, UA: 0.2 (ref 0.0–1.0)
pH: 7 (ref 5.0–8.0)

## 2023-05-17 LAB — LIPID PANEL
Cholesterol: 172 mg/dL (ref 0–200)
HDL: 57.4 mg/dL (ref >39.00)
LDL Cholesterol: 92 mg/dL (ref 0–99)
NonHDL: 114.38
Total CHOL/HDL Ratio: 3
Triglycerides: 112 mg/dL (ref 0.0–149.0)
VLDL: 22.4 mg/dL (ref 0.0–40.0)

## 2023-05-17 LAB — TSH: TSH: 1.08 u[IU]/mL (ref 0.35–5.50)

## 2023-05-17 MED ORDER — SHINGRIX 50 MCG/0.5ML IM SUSR: 0.5000 mL | Freq: Once | INTRAMUSCULAR | 1 refills | Status: AC

## 2023-05-17 NOTE — Progress Notes (Unsigned)
Subjective:  Patient ID: Amber Griffith, female    DOB: 04-21-1951  Age: 72 y.o. MRN: [id number]  CC: Hypertension   HPI Amber Griffith presents for f/up ----   Outpatient Medications Prior to Visit  Medication Sig Dispense Refill   albuterol (VENTOLIN HFA) 108 (90 Base) MCG/ACT inhaler Inhale 2 puffs into the lungs every 6 (six) hours as needed for wheezing or shortness of breath. 6.7 g 4   aspirin EC 81 MG tablet Take 1 tablet (81 mg total) by mouth daily. SWALLOW WHOLE. 90 tablet 1   Cholecalciferol 125 MCG (5000 UT) capsule Take 1 capsule (5,000 Units total) by mouth daily. 90 capsule 1   [employer] HEARTBURN RELIEF EX ST 254-237.5 MG/5ML SUSP TAKE 5 MLS BY MOUTH 3 (THREE) TIMES DAILY AS NEEDED. 355 mL 0   Fluticasone-Umeclidin-Vilant (TRELEGY ELLIPTA) 100-62.5-25 MCG/ACT AEPB Inhale 1 puff into the lungs daily. 120 each 1   HYDROcodone-acetaminophen (NORCO/VICODIN) 5-325 MG tablet Take 1-2 tablets by mouth every 6 (six) hours as needed. 10 tablet 0   nebivolol (BYSTOLIC) 5 MG tablet Take 1 tablet (5 mg total) by mouth daily. 90 tablet 1   omeprazole (PRILOSEC) 40 MG capsule Take 1 capsule (40 mg total) by mouth 2 (two) times daily before a meal. 180 capsule 3   predniSONE (DELTASONE) 50 MG tablet Take 1 tablet (50 mg total) by mouth daily with breakfast. 5 tablet 0   rivaroxaban (XARELTO) 2.5 MG TABS tablet Take 1 tablet (2.5 mg total) by mouth 2 (two) times daily. 180 tablet 1   rosuvastatin (CRESTOR) 5 MG tablet Take 1 tablet (5 mg total) by mouth daily. 90 tablet 1   triamterene-hydrochlorothiazide (DYAZIDE) 37.5-25 MG capsule Take 1 each (1 capsule total) by mouth daily. 90 capsule 1   No facility-administered medications prior to visit.    ROS Review of Systems  Psychiatric/Behavioral:  Positive for confusion and decreased concentration.     Objective:  BP (!) 142/78 (BP Location: Right Arm, Patient Position: Sitting, Cuff Size: Large)   Pulse 60   Temp 98 F (36.7  C) (Oral)   Resp 16   Ht 4\' 10"  (1.473 m)   Wt 89 lb (40.4 kg)   SpO2 98%   BMI 18.60 kg/m   BP Readings from Last 3 Encounters:  05/17/23 (!) 142/78  04/19/23 138/70  01/27/23 (!) 140/84    Wt Readings from Last 3 Encounters:  05/17/23 89 lb (40.4 kg)  04/19/23 90 lb (40.8 kg)  04/13/23 89 lb (40.4 kg)    Physical Exam Vitals reviewed.  Constitutional:      General: She is not in acute distress.    Appearance: She is not ill-appearing, toxic-appearing or diaphoretic.  HENT:     Nose: Nose normal.     Mouth/Throat:     Mouth: Mucous membranes are moist.  Eyes:     General: No scleral icterus.    Conjunctiva/sclera: Conjunctivae normal.  Cardiovascular:     Rate and Rhythm: Normal rate and regular rhythm.     Heart sounds: No murmur heard.    No friction rub. No gallop.  Pulmonary:     Effort: Pulmonary effort is normal.     Breath sounds: No stridor. No wheezing, rhonchi or rales.  Abdominal:     General: Abdomen is flat.     Palpations: There is no mass.     Tenderness: There is no abdominal tenderness. There is no guarding.     Hernia: No  hernia is present.  Musculoskeletal:        General: Normal range of motion.     Cervical back: Neck supple.     Right lower leg: No edema.     Left lower leg: No edema.  Lymphadenopathy:     Cervical: No cervical adenopathy.  Skin:    General: Skin is warm and dry.  Neurological:     General: No focal deficit present.     Mental Status: She is alert. Mental status is at baseline.  Psychiatric:        Mood and Affect: Mood normal.        Behavior: Behavior normal.     Lab Results  Component Value Date   WBC 6.9 04/19/2023   HGB 12.8 04/19/2023   HCT 39.8 04/19/2023   PLT 244.0 04/19/2023   GLUCOSE 72 04/19/2023   CHOL 172 05/17/2023   TRIG 112.0 05/17/2023   HDL 57.40 05/17/2023   LDLDIRECT 84.0 01/25/2018   LDLCALC 92 05/17/2023   ALT 20 12/05/2022   AST 23 12/05/2022   NA 140 04/19/2023   K 3.5  04/19/2023   CL 102 04/19/2023   CREATININE 0.98 04/19/2023   BUN 21 04/19/2023   CO2 32 04/19/2023   TSH 1.08 05/17/2023   INR 1.1 12/05/2022   HGBA1C 4.9 07/30/2013    No results found.   Assessment & Plan:  Need for prophylactic vaccination and inoculation against varicella -     Shingrix; Inject 0.5 mLs into the muscle once for 1 dose.  Dispense: 0.5 mL; Refill: 1  Essential hypertension -     TSH; Future -     Urinalysis, Routine w reflex microscopic; Future  Pure hypercholesterolemia -     Lipid panel; Future -     TSH; Future  Stage 3b chronic kidney disease (HCC) -     Urinalysis, Routine w reflex microscopic; Future     Follow-up: Return in about 4 months (around 09/17/2023).  Sanda Linger, MD

## 2023-05-17 NOTE — Patient Instructions (Signed)
Hypertension, Adult High blood pressure (hypertension) is when the force of blood pumping through the arteries is too strong. The arteries are the blood vessels that carry blood from the heart throughout the body. Hypertension forces the heart to work harder to pump blood and may cause arteries to become narrow or stiff. Untreated or uncontrolled hypertension can lead to a heart attack, heart failure, a stroke, kidney disease, and other problems. A blood pressure reading consists of a higher number over a lower number. Ideally, your blood pressure should be below 120/80. The first ("top") number is called the systolic pressure. It is a measure of the pressure in your arteries as your heart beats. The second ("bottom") number is called the diastolic pressure. It is a measure of the pressure in your arteries as the heart relaxes. What are the causes? The exact cause of this condition is not known. There are some conditions that result in high blood pressure. What increases the risk? Certain factors may make you more likely to develop high blood pressure. Some of these risk factors are under your control, including: Smoking. Not getting enough exercise or physical activity. Being overweight. Having too much fat, sugar, calories, or salt (sodium) in your diet. Drinking too much alcohol. Other risk factors include: Having a personal history of heart disease, diabetes, high cholesterol, or kidney disease. Stress. Having a family history of high blood pressure and high cholesterol. Having obstructive sleep apnea. Age. The risk increases with age. What are the signs or symptoms? High blood pressure may not cause symptoms. Very high blood pressure (hypertensive crisis) may cause: Headache. Fast or irregular heartbeats (palpitations). Shortness of breath. Nosebleed. Nausea and vomiting. Vision changes. Severe chest pain, dizziness, and seizures. How is this diagnosed? This condition is diagnosed by  measuring your blood pressure while you are seated, with your arm resting on a flat surface, your legs uncrossed, and your feet flat on the floor. The cuff of the blood pressure monitor will be placed directly against the skin of your upper arm at the level of your heart. Blood pressure should be measured at least twice using the same arm. Certain conditions can cause a difference in blood pressure between your right and left arms. If you have a high blood pressure reading during one visit or you have normal blood pressure with other risk factors, you may be asked to: Return on a different day to have your blood pressure checked again. Monitor your blood pressure at home for 1 week or longer. If you are diagnosed with hypertension, you may have other blood or imaging tests to help your health care provider understand your overall risk for other conditions. How is this treated? This condition is treated by making healthy lifestyle changes, such as eating healthy foods, exercising more, and reducing your alcohol intake. You may be referred for counseling on a healthy diet and physical activity. Your health care provider may prescribe medicine if lifestyle changes are not enough to get your blood pressure under control and if: Your systolic blood pressure is above 130. Your diastolic blood pressure is above 80. Your personal target blood pressure may vary depending on your medical conditions, your age, and other factors. Follow these instructions at home: Eating and drinking  Eat a diet that is high in fiber and potassium, and low in sodium, added sugar, and fat. An example of this eating plan is called the DASH diet. DASH stands for Dietary Approaches to Stop Hypertension. To eat this way: Eat   plenty of fresh fruits and vegetables. Try to fill one half of your plate at each meal with fruits and vegetables. Eat whole grains, such as whole-wheat pasta, brown rice, or whole-grain bread. Fill about one  fourth of your plate with whole grains. Eat or drink low-fat dairy products, such as skim milk or low-fat yogurt. Avoid fatty cuts of meat, processed or cured meats, and poultry with skin. Fill about one fourth of your plate with lean proteins, such as fish, chicken without skin, beans, eggs, or tofu. Avoid pre-made and processed foods. These tend to be higher in sodium, added sugar, and fat. Reduce your daily sodium intake. Many people with hypertension should eat less than 1,500 mg of sodium a day. Do not drink alcohol if: Your health care provider tells you not to drink. You are pregnant, may be pregnant, or are planning to become pregnant. If you drink alcohol: Limit how much you have to: 0-1 drink a day for women. 0-2 drinks a day for men. Know how much alcohol is in your drink. In the [place] one drink equals one 12 oz bottle of beer (355 mL), one 5 oz glass of wine (148 mL), or one 1 oz glass of hard liquor (44 mL). Lifestyle  Work with your health care provider to maintain a healthy body weight or to lose weight. Ask what an ideal weight is for you. Get at least 30 minutes of exercise that causes your heart to beat faster (aerobic exercise) most days of the week. Activities may include walking, swimming, or biking. Include exercise to strengthen your muscles (resistance exercise), such as Pilates or lifting weights, as part of your weekly exercise routine. Try to do these types of exercises for 30 minutes at least 3 days a week. Do not use any products that contain nicotine or tobacco. These products include cigarettes, chewing tobacco, and vaping devices, such as e-cigarettes. If you need help quitting, ask your health care provider. Monitor your blood pressure at home as told by your health care provider. Keep all follow-up visits. This is important. Medicines Take over-the-counter and prescription medicines only as told by your health care provider. Follow directions carefully. Blood  pressure medicines must be taken as prescribed. Do not skip doses of blood pressure medicine. Doing this puts you at risk for problems and can make the medicine less effective. Ask your health care provider about side effects or reactions to medicines that you should watch for. Contact a health care provider if you: Think you are having a reaction to a medicine you are taking. Have headaches that keep coming back (recurring). Feel dizzy. Have swelling in your ankles. Have trouble with your vision. Get help right away if you: Develop a severe headache or confusion. Have unusual weakness or numbness. Feel faint. Have severe pain in your chest or abdomen. Vomit repeatedly. Have trouble breathing. These symptoms may be an emergency. Get help right away. Call 911. Do not wait to see if the symptoms will go away. Do not drive yourself to the hospital. Summary Hypertension is when the force of blood pumping through your arteries is too strong. If this condition is not controlled, it may put you at risk for serious complications. Your personal target blood pressure may vary depending on your medical conditions, your age, and other factors. For most people, a normal blood pressure is less than 120/80. Hypertension is treated with lifestyle changes, medicines, or a combination of both. Lifestyle changes include losing weight, eating a healthy,   low-sodium diet, exercising more, and limiting alcohol. This information is not intended to replace advice given to you by your health care provider. Make sure you discuss any questions you have with your health care provider. Document Revised: 05/26/2021 Document Reviewed: 05/26/2021 Elsevier Patient Education  2024 Elsevier Inc.  

## 2023-05-18 MED ORDER — DENOSUMAB 60 MG/ML ~~LOC~~ SOSY
60.0000 mg | PREFILLED_SYRINGE | Freq: Once | SUBCUTANEOUS | 1 refills | Status: AC
Start: 2023-05-18 — End: 2023-05-18

## 2023-05-19 ENCOUNTER — Telehealth: Payer: Self-pay

## 2023-05-19 NOTE — Progress Notes (Signed)
   Care Guide Note  05/19/2023 Name: Amber Griffith MRN: [id number] DOB: [birth year]  Referred by: Etta Grandchild, MD Reason for referral : Care Coordination (Outreach to schedule with Pharm d )   Amber Griffith is a 72 y.o. year old female who is a primary care patient of Etta Grandchild, MD. Amber Griffith was referred to the pharmacist for assistance related to COPD.    Successful contact was made with the patient to discuss pharmacy services including being ready for the pharmacist to call at least 5 minutes before the scheduled appointment time, to have medication bottles and any blood sugar or blood pressure readings ready for review. The patient agreed to meet with the pharmacist via with the pharmacist via telephone visit on (date/time).  05/24/2023  Amber Griffith, RMA Care Guide [facility]  [place], [place] [address] Direct Dial: [phone] [email or web address]

## 2023-05-21 ENCOUNTER — Other Ambulatory Visit: Payer: Self-pay | Admitting: Internal Medicine

## 2023-05-21 DIAGNOSIS — I10 Essential (primary) hypertension: Secondary | ICD-10-CM

## 2023-05-24 ENCOUNTER — Other Ambulatory Visit: Payer: 59 | Admitting: Pharmacist

## 2023-05-24 ENCOUNTER — Ambulatory Visit: Payer: 59 | Admitting: Internal Medicine

## 2023-05-24 DIAGNOSIS — E7800 Pure hypercholesterolemia, unspecified: Secondary | ICD-10-CM

## 2023-05-24 DIAGNOSIS — J4489 Other specified chronic obstructive pulmonary disease: Secondary | ICD-10-CM

## 2023-05-24 DIAGNOSIS — I779 Disorder of arteries and arterioles, unspecified: Secondary | ICD-10-CM

## 2023-05-24 MED ORDER — ROSUVASTATIN CALCIUM 5 MG PO TABS: 5.0000 mg | ORAL_TABLET | Freq: Every day | ORAL | 1 refills | Status: DC

## 2023-05-24 MED ORDER — XARELTO 2.5 MG PO TABS: 2.5000 mg | ORAL_TABLET | Freq: Two times a day (BID) | ORAL | 1 refills | Status: DC

## 2023-05-24 MED ORDER — TRELEGY ELLIPTA 100-62.5-25 MCG/ACT IN AEPB: 1.0000 | INHALATION_SPRAY | Freq: Every day | RESPIRATORY_TRACT | 1 refills | Status: DC

## 2023-05-24 NOTE — Progress Notes (Signed)
05/24/2023 Name: Amber Griffith MRN: [id number] DOB: 06-May-1951  Chief Complaint  Patient presents with   Medication Management    Amber Griffith is a 72 y.o. year old female who presented for a telephone visit.   They were referred to the pharmacist by their PCP for assistance in managing  osteoporosis and COPD .    Subjective:  Care Team: Primary Care Provider: Etta Grandchild, MD ; Next Scheduled Visit: 09/19/23 Endocrinologist Dr. Erroll Luna; Next Scheduled Visit: 07/12/2023 (previously followed by endo for osteoporosis, lost to follow up 2017) Pulmonologist: Kandice Robinsons, NP (used to follow, last visit 12/17/2019)  Medication Access/Adherence  Current Pharmacy:  [id number] [address] [address], [place] - [phone] ST AT [facility] OF [address] [place] [address] Phone: [phone] Fax: [phone]   Patient reports affordability concerns with their medications: No  Patient reports access/transportation concerns to their pharmacy: No  Patient reports adherence concerns with their medications:  Yes  Pt has a few medications she has not been taking and didn't realize she needed to.   PAD: Current medications: aspirin 81 mg daily, rosuvastatin 5 mg daily (pt not taking), Xarelto 2.5 mg twice daily (pt not taking) Denies pain in her legs at this time.   COPD: Current medications: trelegy (patient using PRN), albuterol PRN (pt is out)  Pt reports no affordability issues with inhaler  Still smoking 1 PPD, notes she is trying to quit. Has not tried any nicotine replacement such as nicotine patches.  Osteoporosis:  Current medications: Prolia injection on 05/17/2023,  Current supplements: Vitamin D daily  Most recent DEXA: 05/02/23, T score -2.9 LFN   Objective:  Lab Results  Component Value Date   HGBA1C 4.9 07/30/2013    Lab Results  Component Value Date   CREATININE 0.98 04/19/2023   BUN 21 04/19/2023   NA 140 04/19/2023   K  3.5 04/19/2023   CL 102 04/19/2023   CO2 32 04/19/2023    Lab Results  Component Value Date   CHOL 172 05/17/2023   HDL 57.40 05/17/2023   LDLCALC 92 05/17/2023   LDLDIRECT 84.0 01/25/2018   TRIG 112.0 05/17/2023   CHOLHDL 3 05/17/2023    Medications Reviewed Today     Reviewed by Bonita Quin, RPH (Pharmacist) on 05/24/23 at 0925  Med List Status: <None>   Medication Order Taking? Sig Documenting Provider Last Dose Status Informant  albuterol (VENTOLIN HFA) 108 (90 Base) MCG/ACT inhaler [id number] No Inhale 2 puffs into the lungs every 6 (six) hours as needed for wheezing or shortness of breath.  Patient not taking: Reported on 05/24/2023   Pincus Sanes, MD Not Taking Active   aspirin EC 81 MG tablet [id number] Yes Take 1 tablet (81 mg total) by mouth daily. SWALLOW WHOLE. Etta Grandchild, MD Taking Active   Cholecalciferol 125 MCG (5000 UT) capsule [id number] Yes Take 1 capsule (5,000 Units total) by mouth daily. Etta Grandchild, MD Taking Active   [facility] HEARTBURN RELIEF EX ST 254-237.5 MG/5ML SUSP [id number]  TAKE 5 MLS BY MOUTH 3 (THREE) TIMES DAILY AS NEEDED. Etta Grandchild, MD  Active   Fluticasone-Umeclidin-Vilant [facility] ELLIPTA) 100-62.5-25 MCG/ACT AEPB [id number] No Inhale 1 puff into the lungs daily.  Patient not taking: Reported on 05/24/2023   Etta Grandchild, MD Not Taking Active   nebivolol (BYSTOLIC) 5 MG tablet [id number] Yes TAKE 1 TABLET (5 MG TOTAL) BY MOUTH DAILY. Etta Grandchild, MD Taking Active  omeprazole (PRILOSEC) 40 MG capsule [id number] Yes Take 1 capsule (40 mg total) by mouth 2 (two) times daily before a meal.  Patient taking differently: Take 40 mg by mouth daily.   Jenel Lucks, MD Taking Active   rivaroxaban (XARELTO) 2.5 MG TABS tablet [id number] No Take 1 tablet (2.5 mg total) by mouth 2 (two) times daily.  Patient not taking: Reported on 05/24/2023   Etta Grandchild, MD Not Taking Active   rosuvastatin (CRESTOR) 5 MG tablet [id number]  No Take 1 tablet (5 mg total) by mouth daily.  Patient not taking: Reported on 05/24/2023   Etta Grandchild, MD Not Taking Active   triamterene-hydrochlorothiazide (DYAZIDE) 37.5-25 MG capsule [id number] Yes TAKE 1 CAPSULE BY MOUTH EVERY DAY Etta Grandchild, MD Taking Active               Assessment/Plan:   PAD - Currently not optimally controlled - Reviewed importance of adherence to Xarelto and rosuvastatin - LDL goal <70 due to hx of PAD. Last LDL was elevated. Due to patient not taking rosuvastatin (last fill in January 2024) - Will send refill of rosuvastatin, Xarelto - Reviewed importance of smoking cessation - Ordered BP monitor as BP control is important for PAD control  COPD: - Currently controlled.  - Recommend to start taking Trelegy DAILY, not PRN.  - Reviewed difference between maintenance and rescue inhalers - Pt notes she needs refills of Trelegy and albuterol  - Pt expresses desire to quit smoking. Will see if we can get nicotine patches ordered if not cost prohibitive - should be able to get through Medicaid if order a nonOTC brand of nicotine patches. UHC also has Quit for [employer].   Osteoporosis: - Currently inappropriately managed/opportunity for optimization - Recommend to continue Prolia and Vitamin D  - Next appt need to review calcium intake and recommend calcium citrate supplement    Follow Up Plan: 2 week phone follow up  Arbutus Leas, PharmD, BCPS Clinical Pharmacist [place] [facility] at [facility] [phone]

## 2023-05-27 ENCOUNTER — Ambulatory Visit
Admission: RE | Admit: 2023-05-27 | Discharge: 2023-05-27 | Disposition: A | Discharge status: Home / Self Care | Payer: 59 | Source: Ambulatory Visit | Attending: Internal Medicine | Admitting: Internal Medicine

## 2023-05-27 DIAGNOSIS — G8929 Other chronic pain: Secondary | ICD-10-CM

## 2023-06-04 ENCOUNTER — Encounter: Payer: Self-pay | Admitting: Internal Medicine

## 2023-06-06 DIAGNOSIS — M25631 Stiffness of right wrist, not elsewhere classified: Secondary | ICD-10-CM | POA: Diagnosis not present

## 2023-06-06 DIAGNOSIS — S52501D Unspecified fracture of the lower end of right radius, subsequent encounter for closed fracture with routine healing: Secondary | ICD-10-CM | POA: Diagnosis not present

## 2023-06-06 DIAGNOSIS — M25641 Stiffness of right hand, not elsewhere classified: Secondary | ICD-10-CM | POA: Diagnosis not present

## 2023-06-07 ENCOUNTER — Telehealth: Payer: Self-pay | Admitting: Internal Medicine

## 2023-06-07 NOTE — Telephone Encounter (Signed)
Reiterated what Dr Yetta Barre said in his note to her in regards to her MRI. She gave a verbal understanding.

## 2023-06-07 NOTE — Telephone Encounter (Signed)
I see the MRI results but I don't see your comments. Am I over looking them ?

## 2023-06-07 NOTE — Telephone Encounter (Signed)
Patient would like someone to call her about her MRI results.  [phone]

## 2023-06-08 ENCOUNTER — Other Ambulatory Visit: Payer: 59 | Admitting: Pharmacist

## 2023-06-08 DIAGNOSIS — M25631 Stiffness of right wrist, not elsewhere classified: Secondary | ICD-10-CM | POA: Diagnosis not present

## 2023-06-08 DIAGNOSIS — S52501D Unspecified fracture of the lower end of right radius, subsequent encounter for closed fracture with routine healing: Secondary | ICD-10-CM | POA: Diagnosis not present

## 2023-06-08 DIAGNOSIS — F17200 Nicotine dependence, unspecified, uncomplicated: Secondary | ICD-10-CM

## 2023-06-08 DIAGNOSIS — M25641 Stiffness of right hand, not elsewhere classified: Secondary | ICD-10-CM | POA: Diagnosis not present

## 2023-06-08 MED ORDER — NICOTINE 21 MG/24HR TD PT24: 21.0000 mg | MEDICATED_PATCH | Freq: Every day | TRANSDERMAL | 0 refills | Status: DC

## 2023-06-08 NOTE — Progress Notes (Signed)
   06/08/2023 Name: Amber Griffith MRN: [id number] DOB: [birth year]  Chief Complaint  Patient presents with   Medication Management    Amber Griffith is a 72 y.o. year old female who presented for a telephone visit.   They were referred to the pharmacist by their PCP for assistance in managing  osteoporosis and COPD .    Subjective:  Care Team: Primary Care Provider: Etta Grandchild, MD ; Next Scheduled Visit: 09/19/23 Endocrinologist Dr. Erroll Luna; Next Scheduled Visit: 07/12/2023 (previously followed by endo for osteoporosis, lost to follow up 2017) Pulmonologist: Kandice Robinsons, NP (used to follow, last visit 12/17/2019)  Medication Access/Adherence  Current Pharmacy:  [id number] [address], [place] - [address] AT [facility] [address] [place] [address] Phone: [phone] Fax: [phone]   Patient reports affordability concerns with their medications: No  Patient reports access/transportation concerns to their pharmacy: No  Patient reports adherence concerns with their medications:  Yes     PAD: Current medications: aspirin 81 mg daily, rosuvastatin 5 mg daily, Xarelto 2.5 mg twice daily Denies pain in her legs at this time.   COPD: Current medications: trelegy (patient using PRN?), albuterol PRN (pt is out)  Pt reports no affordability issues with inhaler  Still smoking 1 PPD, notes she is trying to quit. Has not tried any nicotine replacement such as nicotine patches.  Osteoporosis:  Current medications: Prolia injection on 05/17/2023,  Current supplements: Vitamin D daily  Most recent DEXA: 05/02/23, T score -2.9 LFN   Objective:     Lab Results  Component Value Date   HGBA1C 4.9 07/30/2013    Lab Results  Component Value Date   CREATININE 0.98 04/19/2023   BUN 21 04/19/2023   NA 140 04/19/2023   K 3.5 04/19/2023   CL 102 04/19/2023   CO2 32 04/19/2023    Lab Results  Component Value Date   CHOL  172 05/17/2023   HDL 57.40 05/17/2023   LDLCALC 92 05/17/2023   LDLDIRECT 84.0 01/25/2018   TRIG 112.0 05/17/2023   CHOLHDL 3 05/17/2023    Medications Reviewed Today   Medications were not reviewed in this encounter       Assessment/Plan:   PAD - Currently not optimally controlled - Reviewed importance of adherence to Xarelto and rosuvastatin - LDL goal <70 due to hx of PAD. Last LDL was elevated. Due to patient not taking rosuvastatin (last fill in January 2024) - Reviewed importance of smoking cessation  COPD: - Currently controlled.  - Recommend to start taking Trelegy DAILY, not PRN.  - Reviewed difference between maintenance and rescue inhalers - Pt expresses desire to quit smoking. should be able to get through Medicaid if order a nonOTC brand of nicotine patches. UHC also has Quit for [employer]. - Recommend starting nicotine patch 21 mg daily   Osteoporosis: - Currently inappropriately managed/opportunity for optimization - Recommend to continue Prolia and Vitamin D  - Next appt need to review calcium intake and recommend calcium citrate supplement    Follow Up Plan: 2 week phone follow up  Arbutus Leas, PharmD, BCPS Clinical Pharmacist [place] [facility] at [facility] [phone]

## 2023-06-14 DIAGNOSIS — S52501D Unspecified fracture of the lower end of right radius, subsequent encounter for closed fracture with routine healing: Secondary | ICD-10-CM | POA: Diagnosis not present

## 2023-06-14 DIAGNOSIS — M25641 Stiffness of right hand, not elsewhere classified: Secondary | ICD-10-CM | POA: Diagnosis not present

## 2023-06-14 DIAGNOSIS — M25631 Stiffness of right wrist, not elsewhere classified: Secondary | ICD-10-CM | POA: Diagnosis not present

## 2023-06-20 DIAGNOSIS — S52501A Unspecified fracture of the lower end of right radius, initial encounter for closed fracture: Secondary | ICD-10-CM | POA: Diagnosis not present

## 2023-06-22 DIAGNOSIS — M25641 Stiffness of right hand, not elsewhere classified: Secondary | ICD-10-CM | POA: Diagnosis not present

## 2023-06-22 DIAGNOSIS — M25631 Stiffness of right wrist, not elsewhere classified: Secondary | ICD-10-CM | POA: Diagnosis not present

## 2023-06-22 DIAGNOSIS — S52501D Unspecified fracture of the lower end of right radius, subsequent encounter for closed fracture with routine healing: Secondary | ICD-10-CM | POA: Diagnosis not present

## 2023-06-23 ENCOUNTER — Other Ambulatory Visit: Payer: 59

## 2023-06-23 ENCOUNTER — Other Ambulatory Visit (HOSPITAL_COMMUNITY): Payer: Self-pay

## 2023-06-28 ENCOUNTER — Other Ambulatory Visit: Payer: 59

## 2023-06-28 ENCOUNTER — Telehealth: Payer: Self-pay

## 2023-06-28 NOTE — Telephone Encounter (Signed)
Received [facility] Prolia PA request.  Dx: M80.00xA - Age-related osteoporosis with current pathological fracture unspecified.

## 2023-06-28 NOTE — Telephone Encounter (Signed)
Prolia Prior authorization has been sent to [place] from [facility]. Please advise.

## 2023-06-29 NOTE — Progress Notes (Signed)
   06/29/2023 Name: Amber Griffith MRN: [id number] DOB: 09-06-1950  No chief complaint on file.   Amber Griffith is a 72 y.o. year old female who presented for a telephone visit.   They were referred to the pharmacist by their PCP for assistance in managing  osteoporosis and COPD .    Subjective:  Care Team: Primary Care Provider: Etta Grandchild, MD ; Next Scheduled Visit: 09/19/23 Endocrinologist Dr. Erroll Luna; Next Scheduled Visit: 07/12/2023 (previously followed by endo for osteoporosis, lost to follow up 2017) Pulmonologist: Kandice Robinsons, NP (used to follow, last visit 12/17/2019)  Medication Access/Adherence  Current Pharmacy:  [id number] [address], [place] - [address] AT [facility] [address] [place] [address] Phone: [phone] Fax: [phone]   Patient reports affordability concerns with their medications: No  Patient reports access/transportation concerns to their pharmacy: No  Patient reports adherence concerns with their medications:  Yes     PAD: Current medications: aspirin 81 mg daily, rosuvastatin 5 mg daily, Xarelto 2.5 mg twice daily Denies pain in her legs at this time.   COPD: Current medications: trelegy (patient using PRN?), albuterol PRN (pt is out)  Pt reports no affordability issues with inhaler  Still smoking 1 PPD, notes she is trying to quit. Has not tried any nicotine replacement such as nicotine patches. Patient notes she has not picked up nicotine patches from pharmacy - she thought they said there was an issue with insurance  Osteoporosis:  Current medications: Prolia injection on 05/17/2023,  Current supplements: Vitamin D daily  Most recent DEXA: 05/02/23, T score -2.9 LFN   Objective:     Lab Results  Component Value Date   HGBA1C 4.9 07/30/2013    Lab Results  Component Value Date   CREATININE 0.98 04/19/2023   BUN 21 04/19/2023   NA 140 04/19/2023   K 3.5 04/19/2023   CL  102 04/19/2023   CO2 32 04/19/2023    Lab Results  Component Value Date   CHOL 172 05/17/2023   HDL 57.40 05/17/2023   LDLCALC 92 05/17/2023   LDLDIRECT 84.0 01/25/2018   TRIG 112.0 05/17/2023   CHOLHDL 3 05/17/2023    Medications Reviewed Today   Medications were not reviewed in this encounter       Assessment/Plan:   PAD - Currently not optimally controlled - Reviewed importance of adherence to Xarelto and rosuvastatin - LDL goal <70 due to hx of PAD. Last LDL was elevated. Due to patient not taking rosuvastatin (last fill in January 2024) - Reviewed importance of smoking cessation  COPD: - Currently controlled.  - Remined pt to take Trelegy DAILY, not PRN.  - Reviewed difference between maintenance and rescue inhalers - Pt expresses desire to quit smoking. should be able to get through Medicaid if order a nonOTC brand of nicotine patches. UHC also has Quit for [employer]. - Recommend starting nicotine patch 21 mg daily - [facility] to make sure they were able to fill nicotine patches for $0 copay through Medicaid   Osteoporosis: - Currently inappropriately managed/opportunity for optimization - Recommend to continue Prolia and Vitamin D  - Next appt need to review calcium intake and recommend calcium citrate supplement    Follow Up Plan: 1 month  Arbutus Leas, PharmD, BCPS Clinical Pharmacist [place] [facility] at [facility] [phone]

## 2023-07-04 ENCOUNTER — Telehealth: Payer: Self-pay

## 2023-07-04 ENCOUNTER — Other Ambulatory Visit (HOSPITAL_COMMUNITY): Payer: Self-pay

## 2023-07-04 NOTE — Telephone Encounter (Signed)
Pharmacy Patient Advocate Encounter   Received notification from Pt Calls Messages that prior authorization for Prolia is required/requested.   Insurance verification completed.   The patient is insured through [facility] .   Per test claim: PA required; PA submitted to above mentioned insurance via [facility] Portal Key/confirmation #/EOC [id number] Status is pending

## 2023-07-05 NOTE — Telephone Encounter (Signed)
Prolia is not covered by insurance - they will need to try Reclast, zometa, boniva, fosamax - all of these are covered - they will need to try at least 1 oral or IV

## 2023-07-05 NOTE — Telephone Encounter (Signed)
Dr. Yetta Barre what are we going to try first ?

## 2023-07-08 ENCOUNTER — Other Ambulatory Visit: Payer: Self-pay | Admitting: Internal Medicine

## 2023-07-08 DIAGNOSIS — M8000XA Age-related osteoporosis with current pathological fracture, unspecified site, initial encounter for fracture: Secondary | ICD-10-CM

## 2023-07-08 NOTE — Telephone Encounter (Signed)
Patient Is aware , gave a verbal understanding.

## 2023-07-11 ENCOUNTER — Encounter: Payer: Self-pay | Admitting: Internal Medicine

## 2023-07-12 ENCOUNTER — Ambulatory Visit: Payer: 59 | Admitting: Endocrinology

## 2023-07-13 ENCOUNTER — Other Ambulatory Visit: Payer: Self-pay | Admitting: Acute Care

## 2023-07-13 DIAGNOSIS — Z87891 Personal history of nicotine dependence: Secondary | ICD-10-CM

## 2023-07-13 DIAGNOSIS — F17210 Nicotine dependence, cigarettes, uncomplicated: Secondary | ICD-10-CM

## 2023-07-13 DIAGNOSIS — Z122 Encounter for screening for malignant neoplasm of respiratory organs: Secondary | ICD-10-CM

## 2023-08-01 ENCOUNTER — Other Ambulatory Visit: Payer: 59

## 2023-08-09 ENCOUNTER — Other Ambulatory Visit: Payer: Self-pay | Admitting: Internal Medicine

## 2023-08-09 DIAGNOSIS — F17200 Nicotine dependence, unspecified, uncomplicated: Secondary | ICD-10-CM

## 2023-08-10 ENCOUNTER — Other Ambulatory Visit: Payer: Medicare PPO

## 2023-09-09 ENCOUNTER — Other Ambulatory Visit: Payer: Self-pay | Admitting: Internal Medicine

## 2023-09-19 ENCOUNTER — Ambulatory Visit: Payer: 59 | Admitting: Internal Medicine

## 2023-09-19 ENCOUNTER — Encounter: Payer: Self-pay | Admitting: Internal Medicine

## 2023-09-19 VITALS — BP 128/68 | HR 65 | Temp 97.6°F | Ht <= 58 in | Wt 97.4 lb

## 2023-09-19 DIAGNOSIS — Z0001 Encounter for general adult medical examination with abnormal findings: Secondary | ICD-10-CM

## 2023-09-19 DIAGNOSIS — N1832 Chronic kidney disease, stage 3b: Secondary | ICD-10-CM

## 2023-09-19 DIAGNOSIS — Z0000 Encounter for general adult medical examination without abnormal findings: Secondary | ICD-10-CM | POA: Diagnosis not present

## 2023-09-19 DIAGNOSIS — E7800 Pure hypercholesterolemia, unspecified: Secondary | ICD-10-CM | POA: Diagnosis not present

## 2023-09-19 DIAGNOSIS — E559 Vitamin D deficiency, unspecified: Secondary | ICD-10-CM

## 2023-09-19 DIAGNOSIS — Z1211 Encounter for screening for malignant neoplasm of colon: Secondary | ICD-10-CM

## 2023-09-19 DIAGNOSIS — E876 Hypokalemia: Secondary | ICD-10-CM

## 2023-09-19 DIAGNOSIS — I517 Cardiomegaly: Secondary | ICD-10-CM

## 2023-09-19 DIAGNOSIS — H6121 Impacted cerumen, right ear: Secondary | ICD-10-CM

## 2023-09-19 DIAGNOSIS — I10 Essential (primary) hypertension: Secondary | ICD-10-CM | POA: Diagnosis not present

## 2023-09-19 DIAGNOSIS — R9431 Abnormal electrocardiogram [ECG] [EKG]: Secondary | ICD-10-CM

## 2023-09-19 LAB — CBC WITH DIFFERENTIAL/PLATELET
Basophils Absolute: 0.1 10*3/uL (ref 0.0–0.1)
Basophils Relative: 1.2 % (ref 0.0–3.0)
Eosinophils Absolute: 0.1 10*3/uL (ref 0.0–0.7)
Eosinophils Relative: 1.4 % (ref 0.0–5.0)
HCT: 39.1 % (ref 36.0–46.0)
Hemoglobin: 12.8 g/dL (ref 12.0–15.0)
Lymphocytes Relative: 33.5 % (ref 12.0–46.0)
Lymphs Abs: 2.5 10*3/uL (ref 0.7–4.0)
MCHC: 32.8 g/dL (ref 30.0–36.0)
MCV: 91.9 fL (ref 78.0–100.0)
Monocytes Absolute: 0.7 10*3/uL (ref 0.1–1.0)
Monocytes Relative: 10.1 % (ref 3.0–12.0)
Neutro Abs: 3.9 10*3/uL (ref 1.4–7.7)
Neutrophils Relative %: 53.8 % (ref 43.0–77.0)
Platelets: 247 10*3/uL (ref 150.0–400.0)
RBC: 4.26 Mil/uL (ref 3.87–5.11)
RDW: 13.3 % (ref 11.5–15.5)
WBC: 7.3 10*3/uL (ref 4.0–10.5)

## 2023-09-19 LAB — HEPATIC FUNCTION PANEL
ALT: 13 U/L (ref 0–35)
AST: 16 U/L (ref 0–37)
Albumin: 4.5 g/dL (ref 3.5–5.2)
Alkaline Phosphatase: 62 U/L (ref 39–117)
Bilirubin, Direct: 0.1 mg/dL (ref 0.0–0.3)
Total Bilirubin: 0.4 mg/dL (ref 0.2–1.2)
Total Protein: 7.2 g/dL (ref 6.0–8.3)

## 2023-09-19 LAB — BASIC METABOLIC PANEL
BUN: 18 mg/dL (ref 6–23)
CO2: 27 meq/L (ref 19–32)
Calcium: 8.9 mg/dL (ref 8.4–10.5)
Chloride: 106 meq/L (ref 96–112)
Creatinine, Ser: 0.9 mg/dL (ref 0.40–1.20)
GFR: 64.49 mL/min (ref >60.00)
Glucose, Bld: 97 mg/dL (ref 70–99)
Potassium: 3.1 meq/L — ABNORMAL LOW (ref 3.5–5.1)
Sodium: 143 meq/L (ref 135–145)

## 2023-09-19 LAB — TROPONIN I (HIGH SENSITIVITY): High Sens Troponin I: 5 ng/L (ref 2–17)

## 2023-09-19 LAB — BRAIN NATRIURETIC PEPTIDE: Pro B Natriuretic peptide (BNP): 35 pg/mL (ref 0.0–100.0)

## 2023-09-19 LAB — VITAMIN D 25 HYDROXY (VIT D DEFICIENCY, FRACTURES): VITD: 50 ng/mL (ref 30.00–100.00)

## 2023-09-19 MED ORDER — SPIRONOLACTONE 25 MG PO TABS: 25.0000 mg | ORAL_TABLET | Freq: Every day | ORAL | 0 refills | Status: DC

## 2023-09-19 NOTE — Patient Instructions (Signed)

## 2023-09-19 NOTE — Progress Notes (Unsigned)
 Subjective:  Patient ID: Amber Griffith, female    DOB: 1950-11-09  Age: 73 y.o. MRN: [id number]  CC: Medical Management of Chronic Issues, Ear Fullness (Right ear), Annual Exam, COPD, and Hyperlipidemia   HPI Amber Griffith presents for a CPX and f/up -----  Discussed the use of AI scribe software for clinical note transcription with the patient, who gave verbal consent to proceed.  History of Present Illness   Amber Griffith is a 73 year old female who presents with a sensation of fullness in the right ear.  She experiences a sensation of fullness in her right ear, describing it as feeling like 'water' is present. The sensation is primarily on the right side, and she occasionally experiences popping sounds. She has attempted to manage the sensation by using a Q-tip but has refrained from excessive use.  She reports a history of wheezing, particularly in the morning, which is relieved by using an inhaler. The inhaler is effective, and she denies any difficulty using it.  She has not quit smoking but reports a reduction in smoking, currently smoking one pack every three days. No smoking-related symptoms such as coughing up blood.  No chest pain, shortness of breath, dizziness, or lightheadedness.       Outpatient Medications Prior to Visit  Medication Sig Dispense Refill   albuterol (VENTOLIN HFA) 108 (90 Base) MCG/ACT inhaler TAKE 2 PUFFS BY MOUTH EVERY 6 HOURS AS NEEDED FOR WHEEZE OR SHORTNESS OF BREATH 6.7 each 4   amoxicillin (AMOXIL) 500 MG capsule Take 500 mg by mouth every 6 (six) hours.     aspirin EC 81 MG tablet Take 1 tablet (81 mg total) by mouth daily. SWALLOW WHOLE. 90 tablet 1   Cholecalciferol 125 MCG (5000 UT) capsule Take 1 capsule (5,000 Units total) by mouth daily. 90 capsule 1   [employer] HEARTBURN RELIEF EX ST 254-237.5 MG/5ML SUSP TAKE 5 MLS BY MOUTH 3 (THREE) TIMES DAILY AS NEEDED. 355 mL 0   Fluticasone-Umeclidin-Vilant (TRELEGY ELLIPTA) 100-62.5-25  MCG/ACT AEPB Inhale 1 puff into the lungs daily. 120 each 1   nebivolol (BYSTOLIC) 5 MG tablet TAKE 1 TABLET (5 MG TOTAL) BY MOUTH DAILY. 90 tablet 1   nicotine (NICODERM CQ - DOSED IN MG/24 HOURS) 21 mg/24hr patch PLACE 1 PATCH ONTO THE SKIN DAILY. 28 patch 0   omeprazole (PRILOSEC) 40 MG capsule Take 1 capsule (40 mg total) by mouth 2 (two) times daily before a meal. (Patient taking differently: Take 40 mg by mouth daily.) 180 capsule 3   rivaroxaban (XARELTO) 2.5 MG TABS tablet Take 1 tablet (2.5 mg total) by mouth 2 (two) times daily. 180 tablet 1   rosuvastatin (CRESTOR) 5 MG tablet Take 1 tablet (5 mg total) by mouth daily. 90 tablet 1   triamterene-hydrochlorothiazide (DYAZIDE) 37.5-25 MG capsule TAKE 1 CAPSULE BY MOUTH EVERY DAY 90 capsule 1   No facility-administered medications prior to visit.    ROS Review of Systems  HENT:  Positive for ear pain and hearing loss.   Respiratory:  Positive for cough, shortness of breath and wheezing. Negative for stridor.     Objective:  BP 128/68 (BP Location: Left Arm, Patient Position: Sitting, Cuff Size: Normal)   Pulse 65   Temp 97.6 F (36.4 C) (Oral)   Ht 4\' 10"  (1.473 m)   Wt 97 lb 6.4 oz (44.2 kg)   SpO2 96%   BMI 20.36 kg/m   BP Readings from Last 3 Encounters:  09/19/23  128/68  05/17/23 (!) 142/78  04/19/23 138/70    Wt Readings from Last 3 Encounters:  09/19/23 97 lb 6.4 oz (44.2 kg)  05/17/23 89 lb (40.4 kg)  04/19/23 90 lb (40.8 kg)    Physical Exam HENT:     Right Ear: Tympanic membrane, ear canal and external ear normal. Decreased hearing noted. There is impacted cerumen.     Left Ear: Hearing, tympanic membrane, ear canal and external ear normal.  Cardiovascular:     Rate and Rhythm: Normal rate and regular rhythm.     Heart sounds: Normal heart sounds, S1 normal and S2 normal.     Comments: EKG- NSR, 64 bpm ?LAE Isolated Q in III Anterior/lateral T wave changes are not new    Lab Results  Component  Value Date   WBC 7.3 09/19/2023   HGB 12.8 09/19/2023   HCT 39.1 09/19/2023   PLT 247.0 09/19/2023   GLUCOSE 97 09/19/2023   CHOL 172 05/17/2023   TRIG 112.0 05/17/2023   HDL 57.40 05/17/2023   LDLDIRECT 84.0 01/25/2018   LDLCALC 92 05/17/2023   ALT 13 09/19/2023   AST 16 09/19/2023   NA 143 09/19/2023   K 3.1 (L) 09/19/2023   CL 106 09/19/2023   CREATININE 0.90 09/19/2023   BUN 18 09/19/2023   CO2 27 09/19/2023   TSH 1.08 05/17/2023   INR 1.1 12/05/2022   HGBA1C 4.9 07/30/2013    MR Brain Wo Contrast Result Date: 05/27/2023 CLINICAL DATA:  Provided history: Headache, new onset. EXAM: MRI HEAD WITHOUT CONTRAST TECHNIQUE: Multiplanar, multiecho pulse sequences of the brain and surrounding structures were obtained without intravenous contrast. COMPARISON:  Head CT 12/05/2022. FINDINGS: Brain: Mild generalized cerebral volume loss. Chronic lacunar infarct again noted within the right basal ganglia/genu of right internal capsule. Minimal multifocal T2 FLAIR hyperintense signal abnormality elsewhere within the cerebral white matter, nonspecific but compatible with chronic small vessel ischemic disease. There is no acute infarct. No evidence of an intracranial mass. No chronic intracranial blood products. No extra-axial fluid collection. No midline shift. Vascular: Maintained flow voids within the proximal large arterial vessels. Skull and upper cervical spine: No focal worrisome marrow lesion. Sinuses/Orbits: No mass or acute finding within the imaged orbits. Minimal mucosal thickening/fluid scattered within bilateral ethmoid air cells. IMPRESSION: 1. No evidence of an acute intracranial abnormality. 2. Chronic lacunar infarct within the right basal ganglia/genu of right internal capsule, unchanged from the prior head CT of 12/05/2022. 3. Minimal background cerebral white matter chronic small vessel ischemic disease. 4. Mild generalized cerebral atrophy. 5. Minor ethmoid sinus disease.  Electronically Signed   By: Jackey Loge D.O.   On: 05/27/2023 12:11    Assessment & Plan:  Essential hypertension -     EKG 12-Lead -     Basic metabolic panel; Future -     CBC with Differential/Platelet; Future -     Spironolactone; Take 1 tablet (25 mg total) by mouth daily.  Dispense: 90 tablet; Refill: 0 -     AMB Referral VBCI Care Management  Cardiomegaly -     Troponin I (High Sensitivity); Future -     Brain natriuretic peptide; Future -     Ambulatory referral to Cardiology -     ECHOCARDIOGRAM COMPLETE; Future  Abnormal electrocardiogram (ECG) (EKG) -     Troponin I (High Sensitivity); Future -     Brain natriuretic peptide; Future -     Ambulatory referral to Cardiology -  ECHOCARDIOGRAM COMPLETE; Future  Stage 3b chronic kidney disease (HCC) -     Basic metabolic panel; Future -     CBC with Differential/Platelet; Future  Vitamin D deficiency disease -     VITAMIN D 25 Hydroxy (Vit-D Deficiency, Fractures); Future  Encounter for general adult medical examination with abnormal findings  Pure hypercholesterolemia -     Hepatic function panel; Future  Screening for colon cancer -     Ambulatory referral to Gastroenterology  Hypokalemia -     Spironolactone; Take 1 tablet (25 mg total) by mouth daily.  Dispense: 90 tablet; Refill: 0 -     AMB Referral VBCI Care Management     Follow-up: Return in about 6 months (around 03/18/2024).  Sanda Linger, MD

## 2023-09-21 ENCOUNTER — Ambulatory Visit: Payer: 59 | Admitting: Cardiology

## 2023-09-22 ENCOUNTER — Encounter: Payer: Self-pay | Admitting: Internal Medicine

## 2023-09-22 DIAGNOSIS — E876 Hypokalemia: Secondary | ICD-10-CM | POA: Insufficient documentation

## 2023-09-22 DIAGNOSIS — H6121 Impacted cerumen, right ear: Secondary | ICD-10-CM | POA: Insufficient documentation

## 2023-09-27 ENCOUNTER — Telehealth: Payer: Self-pay

## 2023-09-27 NOTE — Progress Notes (Signed)
 Care Guide Pharmacy Note  09/27/2023 Name: Amber Griffith MRN: [id number] DOB: 02-03-1951  Referred By: Etta Grandchild, MD Reason for referral: Care Coordination (Outreach to schedule with Pharm d )   Amber Griffith is a 73 y.o. year old female who is a primary care patient of Etta Grandchild, MD.  Amber Griffith was referred to the pharmacist for assistance related to: HTN  An unsuccessful telephone outreach was attempted today to contact the patient who was referred to the pharmacy team for assistance with medication management. Additional attempts will be made to contact the patient.  Penne Lash , RMA     [facility]  [facility], [facility] Guide  Direct Dial: [phone]  Website: [email or web address]

## 2023-10-07 NOTE — Progress Notes (Signed)
 Care Guide Pharmacy Note  10/07/2023 Name: Amber Griffith MRN: [id number] DOB: 1951-07-17  Referred By: Etta Grandchild, MD Reason for referral: Care Coordination (Outreach to schedule with Pharm d )   Amber Griffith is a 73 y.o. year old female who is a primary care patient of Etta Grandchild, MD.  Amber Griffith was referred to the pharmacist for assistance related to: HTN  A second unsuccessful telephone outreach was attempted today to contact the patient who was referred to the pharmacy team for assistance with medication management. Additional attempts will be made to contact the patient.  Burman Nieves, CMA, Care Guide [facility]  [facility], [facility] Guide Direct Dial: [phone]  Fax: [phone] Website: [email or web address]

## 2023-10-10 NOTE — Progress Notes (Signed)
 Complex Care Management Note Care Guide Note  10/10/2023 Name: Amber Griffith MRN: [id number] DOB: 02-03-1951   Complex Care Management Outreach Attempts: A third unsuccessful outreach was attempted today to offer the patient with information about available complex care management services.  Follow Up Plan:  No further outreach attempts will be made at this time. We have been unable to contact the patient to offer or enroll patient in complex care management services.  Encounter Outcome:  No Answer  Burman Nieves, CMA, Care Guide [facility]  [facility], [facility] Guide Direct Dial: [phone]  Fax: [phone] Website: [email or web address]

## 2023-10-19 ENCOUNTER — Other Ambulatory Visit (HOSPITAL_COMMUNITY): Payer: 59

## 2023-11-22 ENCOUNTER — Telehealth (HOSPITAL_COMMUNITY): Payer: Self-pay | Admitting: *Deleted

## 2023-11-22 NOTE — Telephone Encounter (Signed)
 Attempted to call patient regarding upcoming cardiac CT appointment. Left message on voicemail with name and callback number [name] RN Navigator Cardiac Imaging [facility] [phone] Office

## 2023-11-23 ENCOUNTER — Ambulatory Visit (HOSPITAL_COMMUNITY): Admission: RE | Admit: 2023-11-23 | Source: Ambulatory Visit

## 2023-11-26 ENCOUNTER — Other Ambulatory Visit: Payer: Self-pay | Admitting: Internal Medicine

## 2023-11-26 DIAGNOSIS — E559 Vitamin D deficiency, unspecified: Secondary | ICD-10-CM

## 2023-11-27 ENCOUNTER — Other Ambulatory Visit: Payer: Self-pay | Admitting: Internal Medicine

## 2023-11-27 DIAGNOSIS — I10 Essential (primary) hypertension: Secondary | ICD-10-CM

## 2023-11-28 ENCOUNTER — Ambulatory Visit: Payer: 59 | Admitting: Cardiology

## 2023-11-28 NOTE — Progress Notes (Deleted)
 Cardiology Office Note:  .   Date:  11/28/2023  ID:  Amber Griffith, DOB 1950/11/28, MRN [id number] PCP: Arcadio Knuckles, MD  [facility] Providers Cardiologist:  None { Click to update primary MD,subspecialty MD or APP then REFRESH:1}  History of Present Illness: .   Amber Griffith is a 73 y.o. African-American female patient with primary hypertension, abnormal EKG, stage IIIb chronic kidney disease, hypovitaminosis D and hypercholesterolemia referred to me for evaluation and management of abnormal EKG and hypertension.  She also has moderate to severe decrease in ABI on the right and moderate on the left with symptoms of claudication.   Discussed the use of AI scribe software for clinical note transcription with the patient, who gave verbal consent to proceed.  History of Present Illness   Labs   Lab Results  Component Value Date   CHOL 172 05/17/2023   HDL 57.40 05/17/2023   LDLCALC 92 05/17/2023   LDLDIRECT 84.0 01/25/2018   TRIG 112.0 05/17/2023   CHOLHDL 3 05/17/2023   Lab Results  Component Value Date   NA 143 09/19/2023   K 3.1 (L) 09/19/2023   CO2 27 09/19/2023   GLUCOSE 97 09/19/2023   BUN 18 09/19/2023   CREATININE 0.90 09/19/2023   CALCIUM  8.9 09/19/2023   GFR 64.49 09/19/2023   GFRNONAA >60 12/05/2022      Latest Ref Rng & Units 09/19/2023   11:16 AM 04/19/2023   11:30 AM 12/20/2022   10:40 AM  BMP  Glucose 70 - 99 mg/dL 97  72  621   BUN 6 - 23 mg/dL 18  21  8    Creatinine 0.40 - 1.20 mg/dL 3.08  6.57  8.46   Sodium 135 - 145 mEq/L 143  140  140   Potassium 3.5 - 5.1 mEq/L 3.1  3.5  3.5   Chloride 96 - 112 mEq/L 106  102  102   CO2 19 - 32 mEq/L 27  32  29   Calcium  8.4 - 10.5 mg/dL 8.9  9.5  9.6       Latest Ref Rng & Units 09/19/2023   11:16 AM 04/19/2023   11:30 AM 12/05/2022    3:57 PM  CBC  WBC 4.0 - 10.5 K/uL 7.3  6.9  10.4   Hemoglobin 12.0 - 15.0 g/dL 96.2  95.2  84.1   Hematocrit 36.0 - 46.0 % 39.1  39.8  38.3    Platelets 150.0 - 400.0 K/uL 247.0  244.0  261    Lab Results  Component Value Date   HGBA1C 4.9 07/30/2013    Lab Results  Component Value Date   TSH 1.08 05/17/2023    ***ROS Physical Exam:   VS:  There were no vitals taken for this visit.   Wt Readings from Last 3 Encounters:  09/19/23 97 lb 6.4 oz (44.2 kg)  05/17/23 89 lb (40.4 kg)  04/19/23 90 lb (40.8 kg)    ***Physical Exam Studies Reviewed: Aaron Aas    ABI 03/17/2021: Right ABI 0.55 moderately reduced Left ABI 0.77, moderately reduced Absent flow in bilateral PTA. EKG:          EKG 09/19/2023: Normal sinus rhythm at rate of 80 bpm, left atrial enlargement, normal axis.  T wave inversion, anterolateral ischemia.  Compared to 05/18/2022, first-degree block not present.  Medications and allergies    No Known Allergies   Current Outpatient Medications:    albuterol  (VENTOLIN  HFA) 108 (90 Base) MCG/ACT inhaler, TAKE 2  PUFFS BY MOUTH EVERY 6 HOURS AS NEEDED FOR WHEEZE OR SHORTNESS OF BREATH, Disp: 6.7 each, Rfl: 4   amoxicillin (AMOXIL) 500 MG capsule, Take 500 mg by mouth every 6 (six) hours., Disp: , Rfl:    aspirin  EC 81 MG tablet, Take 1 tablet (81 mg total) by mouth daily. SWALLOW WHOLE., Disp: 90 tablet, Rfl: 1   Cholecalciferol  125 MCG (5000 UT) capsule, Take 1 capsule (5,000 Units total) by mouth daily., Disp: 90 capsule, Rfl: 1   [employer] HEARTBURN RELIEF EX ST 254-237.5 MG/5ML SUSP, TAKE 5 MLS BY MOUTH 3 (THREE) TIMES DAILY AS NEEDED., Disp: 355 mL, Rfl: 0   Fluticasone -Umeclidin-Vilant (TRELEGY ELLIPTA ) 100-62.5-25 MCG/ACT AEPB, Inhale 1 puff into the lungs daily., Disp: 120 each, Rfl: 1   nebivolol  (BYSTOLIC ) 5 MG tablet, TAKE 1 TABLET (5 MG TOTAL) BY MOUTH DAILY., Disp: 90 tablet, Rfl: 1   nicotine  (NICODERM CQ  - DOSED IN MG/24 HOURS) 21 mg/24hr patch, PLACE 1 PATCH ONTO THE SKIN DAILY., Disp: 28 patch, Rfl: 0   omeprazole  (PRILOSEC) 40 MG capsule, Take 1 capsule (40 mg total) by mouth 2 (two) times daily before a  meal. (Patient taking differently: Take 40 mg by mouth daily.), Disp: 180 capsule, Rfl: 3   rivaroxaban  (XARELTO ) 2.5 MG TABS tablet, Take 1 tablet (2.5 mg total) by mouth 2 (two) times daily., Disp: 180 tablet, Rfl: 1   rosuvastatin  (CRESTOR ) 5 MG tablet, Take 1 tablet (5 mg total) by mouth daily., Disp: 90 tablet, Rfl: 1   spironolactone  (ALDACTONE ) 25 MG tablet, Take 1 tablet (25 mg total) by mouth daily., Disp: 90 tablet, Rfl: 0   No orders of the defined types were placed in this encounter.    There are no discontinued medications.   ASSESSMENT AND PLAN: .      ICD-10-CM   1. Abnormal EKG  R94.31     2. Claudication in peripheral vascular disease (HCC)  I73.9     3. Primary hypertension  I10     4. Hypokalemia  E87.6     5. Pure hypercholesterolemia  E78.00       Assessment and Plan Assessment & Plan    Signed,  Knox Perl, MD, [facility] 11/28/2023, 11:56 AM [facility] [address] [place], [place] [address] Phone: [phone]. Fax:  [phone]

## 2023-12-01 ENCOUNTER — Telehealth (HOSPITAL_COMMUNITY): Payer: Self-pay | Admitting: *Deleted

## 2023-12-01 NOTE — Telephone Encounter (Signed)
 Attempted to call patient regarding upcoming cardiac CT appointment. Left message on voicemail with name and callback number  Larey Brick RN Navigator Cardiac Imaging [facility] [phone] Office [phone] Cell

## 2023-12-02 ENCOUNTER — Ambulatory Visit (HOSPITAL_COMMUNITY)
Admission: RE | Admit: 2023-12-02 | Discharge: 2023-12-02 | Disposition: A | Discharge status: Home / Self Care | Source: Ambulatory Visit | Attending: Internal Medicine | Admitting: Internal Medicine

## 2023-12-02 ENCOUNTER — Encounter (HOSPITAL_COMMUNITY): Payer: Self-pay

## 2023-12-02 DIAGNOSIS — R9431 Abnormal electrocardiogram [ECG] [EKG]: Secondary | ICD-10-CM

## 2023-12-20 ENCOUNTER — Other Ambulatory Visit: Payer: Self-pay

## 2023-12-25 ENCOUNTER — Other Ambulatory Visit: Payer: Self-pay | Admitting: Internal Medicine

## 2023-12-25 DIAGNOSIS — R9431 Abnormal electrocardiogram [ECG] [EKG]: Secondary | ICD-10-CM

## 2023-12-27 ENCOUNTER — Ambulatory Visit (HOSPITAL_COMMUNITY)
Admission: RE | Admit: 2023-12-27 | Discharge: 2023-12-27 | Disposition: A | Discharge status: Home / Self Care | Source: Ambulatory Visit | Attending: Cardiology | Admitting: Cardiology

## 2023-12-27 ENCOUNTER — Other Ambulatory Visit: Payer: Self-pay | Admitting: Cardiology

## 2023-12-27 ENCOUNTER — Ambulatory Visit (HOSPITAL_BASED_OUTPATIENT_CLINIC_OR_DEPARTMENT_OTHER)
Admission: RE | Admit: 2023-12-27 | Discharge: 2023-12-27 | Disposition: A | Discharge status: Home / Self Care | Source: Ambulatory Visit | Attending: Cardiology | Admitting: Cardiology

## 2023-12-27 DIAGNOSIS — I2510 Atherosclerotic heart disease of native coronary artery without angina pectoris: Secondary | ICD-10-CM | POA: Insufficient documentation

## 2023-12-27 DIAGNOSIS — R9431 Abnormal electrocardiogram [ECG] [EKG]: Secondary | ICD-10-CM | POA: Diagnosis present

## 2023-12-27 DIAGNOSIS — R931 Abnormal findings on diagnostic imaging of heart and coronary circulation: Secondary | ICD-10-CM | POA: Insufficient documentation

## 2023-12-27 LAB — POCT I-STAT, CHEM 8
BUN: 14 mg/dL (ref 8–23)
Calcium, Ion: 1.14 mmol/L — ABNORMAL LOW (ref 1.15–1.40)
Chloride: 104 mmol/L (ref 98–111)
Creatinine, Ser: 0.8 mg/dL (ref 0.44–1.00)
Glucose, Bld: 88 mg/dL (ref 70–99)
HCT: 43 % (ref 36.0–46.0)
Hemoglobin: 14.6 g/dL (ref 12.0–15.0)
Potassium: 4.7 mmol/L (ref 3.5–5.1)
Sodium: 138 mmol/L (ref 135–145)
TCO2: 27 mmol/L (ref 22–32)

## 2023-12-27 MED ORDER — IOHEXOL 350 MG/ML SOLN
100.0000 mL | Freq: Once | INTRAVENOUS | Status: AC | PRN
  Administered 2023-12-27: 100 mL via INTRAVENOUS

## 2023-12-27 MED ORDER — DILTIAZEM HCL 25 MG/5ML IV SOLN: 10.0000 mg | INTRAVENOUS | Status: DC | PRN

## 2023-12-27 MED ORDER — NITROGLYCERIN 0.4 MG SL SUBL
0.8000 mg | SUBLINGUAL_TABLET | Freq: Once | SUBLINGUAL | Status: AC
  Administered 2023-12-27: 0.8 mg via SUBLINGUAL

## 2023-12-27 MED ORDER — METOPROLOL TARTRATE 5 MG/5ML IV SOLN: 10.0000 mg | Freq: Once | INTRAVENOUS | Status: DC | PRN

## 2024-01-03 ENCOUNTER — Other Ambulatory Visit: Payer: Self-pay | Admitting: Internal Medicine

## 2024-01-03 DIAGNOSIS — J4489 Other specified chronic obstructive pulmonary disease: Secondary | ICD-10-CM

## 2024-01-03 DIAGNOSIS — I779 Disorder of arteries and arterioles, unspecified: Secondary | ICD-10-CM

## 2024-01-03 DIAGNOSIS — E7800 Pure hypercholesterolemia, unspecified: Secondary | ICD-10-CM

## 2024-01-03 MED ORDER — TRELEGY ELLIPTA 100-62.5-25 MCG/ACT IN AEPB: 1.0000 | INHALATION_SPRAY | Freq: Every day | RESPIRATORY_TRACT | 0 refills | Status: DC

## 2024-01-03 MED ORDER — ASPIRIN 81 MG PO TBEC: 81.0000 mg | DELAYED_RELEASE_TABLET | Freq: Every day | ORAL | 1 refills | Status: AC

## 2024-01-03 MED ORDER — ROSUVASTATIN CALCIUM 5 MG PO TABS
5.0000 mg | ORAL_TABLET | Freq: Every day | ORAL | 1 refills | Status: DC
Start: 2024-01-03 — End: 2024-01-25

## 2024-01-17 ENCOUNTER — Ambulatory Visit (INDEPENDENT_AMBULATORY_CARE_PROVIDER_SITE_OTHER): Admitting: Internal Medicine

## 2024-01-17 ENCOUNTER — Encounter: Payer: Self-pay | Admitting: Internal Medicine

## 2024-01-17 ENCOUNTER — Ambulatory Visit (INDEPENDENT_AMBULATORY_CARE_PROVIDER_SITE_OTHER)

## 2024-01-17 VITALS — BP 172/84 | HR 60 | Temp 98.5°F | Resp 16 | Ht <= 58 in | Wt 94.6 lb

## 2024-01-17 DIAGNOSIS — R051 Acute cough: Secondary | ICD-10-CM | POA: Diagnosis not present

## 2024-01-17 DIAGNOSIS — E876 Hypokalemia: Secondary | ICD-10-CM | POA: Diagnosis not present

## 2024-01-17 DIAGNOSIS — I10 Essential (primary) hypertension: Secondary | ICD-10-CM

## 2024-01-17 DIAGNOSIS — J441 Chronic obstructive pulmonary disease with (acute) exacerbation: Secondary | ICD-10-CM | POA: Diagnosis not present

## 2024-01-17 MED ORDER — PREDNISONE 20 MG PO TABS: 40.0000 mg | ORAL_TABLET | Freq: Two times a day (BID) | ORAL | 0 refills | Status: AC

## 2024-01-17 MED ORDER — AZITHROMYCIN 500 MG PO TABS: 500.0000 mg | ORAL_TABLET | Freq: Every day | ORAL | 0 refills | Status: DC

## 2024-01-17 MED ORDER — HYDROCODONE BIT-HOMATROP MBR 5-1.5 MG/5ML PO SOLN: 5.0000 mL | Freq: Three times a day (TID) | ORAL | 0 refills | Status: DC | PRN

## 2024-01-17 NOTE — Progress Notes (Signed)
 Subjective:  Patient ID: Amber Griffith, female    DOB: [birth year]  Age: 73 y.o. MRN: [id number]  CC: Cough (For about a week. Patient states that the cough is continuous. Chest sounds very congested.  ) and Hypertension   HPI Amber Griffith presents for f/up ----  Discussed the use of AI scribe software for clinical note transcription with the patient, who gave verbal consent to proceed.  History of Present Illness   Amber Griffith is a 73 year old female who presents with a persistent cough and cold symptoms.  She has been experiencing a cough for the past week, producing clear white phlegm. No chest pain, but she notes a rattling sensation in her chest. The cough is more pronounced at night.  She has been using over-the-counter medications, including a product from [facility] for flu, cold, and cough, which she believes does not contain a decongestant. Additionally, she has been using Theraflu, which contains a decongestant.  She experiences shortness of breath. No fever, chills, night sweats, or hemoptysis. She recalls recent exposure to someone who was coughing, after which she began experiencing symptoms such as sneezing and coughing up phlegm.       Outpatient Medications Prior to Visit  Medication Sig Dispense Refill   albuterol  (VENTOLIN  HFA) 108 (90 Base) MCG/ACT inhaler TAKE 2 PUFFS BY MOUTH EVERY 6 HOURS AS NEEDED FOR WHEEZE OR SHORTNESS OF BREATH 6.7 each 4   amoxicillin (AMOXIL) 500 MG capsule Take 500 mg by mouth every 6 (six) hours.     aspirin  EC 81 MG tablet Take 1 tablet (81 mg total) by mouth daily. SWALLOW WHOLE. 90 tablet 1   [facility] D3 125 MCG (5000 UT) capsule TAKE 1 CAPSULE BY MOUTH EVERY DAY 90 capsule 1   [facility] HEARTBURN RELIEF EX ST 254-237.5 MG/5ML SUSP TAKE 5 MLS BY MOUTH 3 (THREE) TIMES DAILY AS NEEDED. 355 mL 0   Fluticasone -Umeclidin-Vilant (TRELEGY ELLIPTA ) 100-62.5-25 MCG/ACT AEPB Inhale 1 puff into the lungs daily. 120 each 0   nicotine  (NICODERM  CQ - DOSED IN MG/24 HOURS) 21 mg/24hr patch PLACE 1 PATCH ONTO THE SKIN DAILY. 28 patch 0   omeprazole  (PRILOSEC) 40 MG capsule Take 1 capsule (40 mg total) by mouth 2 (two) times daily before a meal. (Patient taking differently: Take 40 mg by mouth daily.) 180 capsule 3   rivaroxaban  (XARELTO ) 2.5 MG TABS tablet Take 1 tablet (2.5 mg total) by mouth 2 (two) times daily. 180 tablet 1   rosuvastatin  (CRESTOR ) 5 MG tablet Take 1 tablet (5 mg total) by mouth daily. 90 tablet 1   nebivolol  (BYSTOLIC ) 5 MG tablet TAKE 1 TABLET (5 MG TOTAL) BY MOUTH DAILY. 90 tablet 1   spironolactone  (ALDACTONE ) 25 MG tablet Take 1 tablet (25 mg total) by mouth daily. 90 tablet 0   No facility-administered medications prior to visit.    ROS Review of Systems  Constitutional:  Negative for appetite change, chills, diaphoresis, fatigue and fever.  HENT: Negative.    Respiratory:  Positive for cough and shortness of breath. Negative for chest tightness and wheezing.   Cardiovascular:  Negative for chest pain, palpitations and leg swelling.  Gastrointestinal:  Negative for abdominal pain, blood in stool, constipation, diarrhea, nausea and vomiting.  Genitourinary: Negative.  Negative for difficulty urinating.  Musculoskeletal: Negative.  Negative for myalgias.  Skin: Negative.   Neurological:  Negative for dizziness and weakness.  Hematological:  Negative for adenopathy. Does not bruise/bleed easily.  Psychiatric/Behavioral: Negative.  Objective:  BP (!) 172/84 (BP Location: Left Arm, Patient Position: Sitting, Cuff Size: Small)   Pulse 60   Temp 98.5 F (36.9 C) (Oral)   Resp 16   Ht 4' 10 (1.473 m)   Wt 94 lb 9.6 oz (42.9 kg)   SpO2 95%   BMI 19.77 kg/m   BP Readings from Last 3 Encounters:  01/17/24 (!) 172/84  12/27/23 (!) 150/76  09/19/23 128/68    Wt Readings from Last 3 Encounters:  01/17/24 94 lb 9.6 oz (42.9 kg)  09/19/23 97 lb 6.4 oz (44.2 kg)  05/17/23 89 lb (40.4 kg)     Physical Exam Vitals reviewed.  Constitutional:      General: She is not in acute distress.    Appearance: She is not toxic-appearing or diaphoretic.  HENT:     Nose: Nose normal.     Mouth/Throat:     Mouth: Mucous membranes are moist.   Eyes:     General: No scleral icterus.    Conjunctiva/sclera: Conjunctivae normal.    Cardiovascular:     Rate and Rhythm: Normal rate and regular rhythm.     Heart sounds: No murmur heard.    No friction rub. No gallop.  Pulmonary:     Effort: Pulmonary effort is normal.     Breath sounds: No stridor. Examination of the right-upper field reveals rhonchi. Examination of the left-upper field reveals rhonchi. Examination of the right-middle field reveals rhonchi. Examination of the left-middle field reveals rhonchi. Examination of the right-lower field reveals rhonchi. Rhonchi present. No decreased breath sounds, wheezing or rales.  Abdominal:     General: Abdomen is flat.     Palpations: There is no mass.     Tenderness: There is no abdominal tenderness. There is no guarding.     Hernia: No hernia is present.   Musculoskeletal:     Cervical back: Neck supple.     Right lower leg: No edema.     Left lower leg: No edema.  Lymphadenopathy:     Cervical: No cervical adenopathy.   Skin:    General: Skin is warm and dry.   Neurological:     General: No focal deficit present.     Mental Status: She is alert. Mental status is at baseline.   Psychiatric:        Mood and Affect: Mood normal.        Behavior: Behavior normal.     Lab Results  Component Value Date   WBC 7.3 09/19/2023   HGB 14.6 12/27/2023   HCT 43.0 12/27/2023   PLT 247.0 09/19/2023   GLUCOSE 88 12/27/2023   CHOL 172 05/17/2023   TRIG 112.0 05/17/2023   HDL 57.40 05/17/2023   LDLDIRECT 84.0 01/25/2018   LDLCALC 92 05/17/2023   ALT 13 09/19/2023   AST 16 09/19/2023   NA 138 12/27/2023   K 4.7 12/27/2023   CL 104 12/27/2023   CREATININE 0.80 12/27/2023   BUN  14 12/27/2023   CO2 27 09/19/2023   TSH 1.08 05/17/2023   INR 1.1 12/05/2022   HGBA1C 4.9 07/30/2013    CT CORONARY MORPH W/CTA COR W/SCORE W/CA W/CM &/OR WO/CM Addendum Date: 01/06/2024 ADDENDUM REPORT: 01/06/2024 23:37 EXAM: OVER-READ INTERPRETATION  CT CHEST The following report is an over-read performed by radiologist Dr. Violeta Grey of [facility] on 01/06/2024. This over-read does not include interpretation of cardiac or coronary anatomy or pathology. The coronary calcium  score/coronary CTA interpretation by the cardiologist  is attached. COMPARISON:  None. FINDINGS: Cardiovascular: There are no significant extracardiac vascular findings. Mediastinum/Nodes: There are no enlarged lymph nodes within the visualized mediastinum. Lungs/Pleura: There is no pleural effusion. The visualized lungs appear clear. Upper abdomen: No significant findings in the visualized upper abdomen. Musculoskeletal/Chest wall: No chest wall mass or suspicious osseous findings within the visualized chest. IMPRESSION: No significant extracardiac findings within the visualized chest. Electronically Signed   By: Violeta Grey M.D.   On: 01/06/2024 23:37   Result Date: 01/06/2024 CLINICAL DATA:  This is a 73 year old female with anginal symptoms. EXAM: Cardiac/Coronary  CTA TECHNIQUE: The patient was scanned on a [employer]. FINDINGS: A 100 kV prospective scan was triggered in the descending thoracic aorta at 111 HU's. Axial non-contrast 3 mm slices were carried out through the heart. The data set was analyzed on a dedicated work station and scored using the Agatson method. Gantry rotation speed was 250 msecs and collimation was .6 mm. No beta blockade and 0.8 mg of sl NTG was given. The 3D data set was reconstructed in 5% intervals of the 67-82 % of the R-R cycle. Diastolic phases were analyzed on a dedicated work station using MPR, MIP and VRT modes. The patient received 80 cc of contrast. Aorta: Normal size.  Mild aortic root calcifications. No dissection. Aortic Valve:  Trileaflet.  No calcifications. Coronary Arteries:  Normal coronary origin.  Right dominance. RCA is a large dominant artery that gives rise to PDA and PLA. There are two separate mild (24-49%) mixed plaques in the proximal RCA. Diffuse minimal calcifications in the mid to distal RCA. Distal RCA with focal calcified plaques. Left main is a large artery that gives rise to LAD and LCX arteries. LAD is a large vessel. There is a moderate (50-69%) focal calcification in the proximal portion of the LAD. The proximal to mid LAD with 2 separate moderate focal calcified plaques. Distal mild focal soft plaque. First diagonal branch is a medium sized vessel with mild proximal soft plaques. Mid and distal portion of the D1 vessel with no plaques. D2 and D3 with no plaques. LCX is a non-dominant artery that gives rise to one large OM1 branch. There is minimal soft plaque in the mid portion of the LCX vessel. The proximal and distal portions with no plaques. Coronary Calcium  Score: Left main: 0 Left anterior descending artery: 125 Left circumflex artery: 2.8 Right coronary artery: 97.2 Total: 225 Percentile: 85 Other findings: Normal pulmonary vein drainage into the left atrium. Normal left atrial appendage without a thrombus. Normal size of the pulmonary artery. IMPRESSION: 1. Coronary calcium  score of 225. This was 18 percentile for age and sex matched control. 2. Normal coronary origin with right dominance. 3. CAD-RADS 3. Moderate stenosis. Consider symptom-guided anti-ischemic pharmacotherapy as well as risk factor modification per guideline directed care. Additional analysis with CT FFR will be submitted. 4. Total plaque volume 512 mm3 which is 74th percentile for age- and sex-matched controls (calcified plaque 48 mm3; non-calcified plaque 464 mm3, Low attenuation 7 mm3). The noncardiac portion of this study will be interpreted in separate report by the  radiologist. Electronically Signed: By: Kardie  Tobb D.O. On: 12/27/2023 17:15   CT CORONARY FRACTIONAL FLOW RESERVE FLUID ANALYSIS Result Date: 12/27/2023 EXAM: CT FFR ANALYSIS CLINICAL DATA:  cad FINDINGS: FFRct analysis was performed on the original cardiac CT angiogram dataset. Diagrammatic representation of the FFRct analysis is provided in a separate PDF document in PACS. This dictation was created  using the PDF document and an interactive 3D model of the results. 3D model is not available in the EMR/PACS. Normal FFR range is >0.80. Indeterminate (grey) zone is 0.76-0.80. 1. Left Main: FFR = 0.99 2. LAD: Proximal FFR = 0.94, mid FFR = 0.94, distal FFR = 0.81 3. LCX: Proximal FFR = 0.97, distal FFR = 0.93 4. RCA: Proximal FFR = 1.00, mid FFR =0.99, distal FFR = 0.83 IMPRESSION: 1.  CT FFR analysis showed no significant stenosis. RECOMMENDATIONS: Guideline-directed medical therapy and aggressive risk factor modification for secondary prevention of coronary artery disease. Electronically Signed   By: Kardie  Tobb D.O.   On: 12/27/2023 18:04   DG Chest 2 View Result Date: 01/17/2024 CLINICAL DATA:  Cough, congestion, short of breath for 1 week EXAM: CHEST - 2 VIEW COMPARISON:  01/17/2023 FINDINGS: Frontal and lateral views of the chest demonstrate mild enlargement of the cardiac silhouette. Stable coarsening of the pulmonary interstitium consistent with background emphysema. No acute airspace disease, effusion, or pneumothorax. No acute bony abnormalities. IMPRESSION: 1. Emphysema.  No acute airspace disease. Electronically Signed   By: Bobbye Burrow M.D.   On: 01/17/2024 16:57     Assessment & Plan:  Acute cough -     DG Chest 2 View; Future -     HYDROcodone  Bit-Homatrop MBr; Take 5 mLs by mouth every 8 (eight) hours as needed for cough.  Dispense: 120 mL; Refill: 0  COPD with acute exacerbation (HCC) -     Azithromycin ; Take 1 tablet (500 mg total) by mouth daily.  Dispense: 3 tablet; Refill:  0 -     HYDROcodone  Bit-Homatrop MBr; Take 5 mLs by mouth every 8 (eight) hours as needed for cough.  Dispense: 120 mL; Refill: 0 -     predniSONE ; Take 2 tablets (40 mg total) by mouth 2 (two) times daily with a meal for 5 days.  Dispense: 20 tablet; Refill: 0  Essential hypertension- Her BP is not at goal. She agrees to stop taking the decongestant.     Follow-up: Return in about 3 months (around 04/18/2024).  Sandra Crouch, MD

## 2024-01-17 NOTE — Patient Instructions (Signed)
 PLEASE STOP TAKING THE DECONGESTANT  Cough, Adult Coughing is a reflex that clears your throat and airways (respiratory system). It helps heal and protect your lungs. It is normal to cough from time to time. A cough that happens with other symptoms or that lasts a long time may be a sign of a condition that needs treatment. A short-term (acute) cough may only last 2-3 weeks. A long-term (chronic) cough may last 8 or more weeks. Coughing is often caused by: Diseases, such as: An infection of the respiratory system. Asthma or other heart or lung diseases. Gastroesophageal reflux. This is when acid comes back up from the stomach. Breathing in things that irritate your lungs. Allergies. Postnasal drip. This is when mucus runs down the back of your throat. Smoking. Some medicines. Follow these instructions at home: Medicines Take over-the-counter and prescription medicines only as told by your health care provider. Talk with your provider before you take cough medicine (cough suppressants). Eating and drinking Do not drink alcohol. Avoid caffeine. Drink enough fluid to keep your pee (urine) pale yellow. Lifestyle Avoid cigarette smoke. Do not use any products that contain nicotine  or tobacco. These products include cigarettes, chewing tobacco, and vaping devices, such as e-cigarettes. If you need help quitting, ask your provider. Avoid things that make you cough. These may include perfumes, candles, cleaning products, or campfire smoke. General instructions  Watch for any changes to your cough. Tell your provider about them. Always cover your mouth when you cough. If the air is dry in your bedroom or home, use a cool mist vaporizer or humidifier. If your cough is worse at night, try to sleep in a semi-upright position. Rest as needed. Contact a health care provider if: You have new symptoms, or your symptoms get worse. You cough up pus. You have a fever that does not go away or a cough  that does not get better after 2-3 weeks. You cannot control your cough with medicine, and you are losing sleep. You have pain that gets worse or is not helped with medicine. You lose weight for no clear reason. You have night sweats. Get help right away if: You cough up blood. You have trouble breathing. Your heart is beating very fast. These symptoms may be an emergency. Get help right away. Call 911. Do not wait to see if the symptoms will go away. Do not drive yourself to the hospital. This information is not intended to replace advice given to you by your health care provider. Make sure you discuss any questions you have with your health care provider. Document Revised: 03/19/2022 Document Reviewed: 03/19/2022 Elsevier Patient Education  2024 [employer].

## 2024-01-18 MED ORDER — NEBIVOLOL HCL 5 MG PO TABS: 5.0000 mg | ORAL_TABLET | Freq: Every day | ORAL | 0 refills | Status: DC

## 2024-01-18 MED ORDER — SPIRONOLACTONE 25 MG PO TABS: 25.0000 mg | ORAL_TABLET | Freq: Every day | ORAL | 0 refills | Status: DC

## 2024-01-25 ENCOUNTER — Other Ambulatory Visit: Payer: Self-pay | Admitting: *Deleted

## 2024-01-25 ENCOUNTER — Ambulatory Visit: Attending: Cardiology | Admitting: Cardiology

## 2024-01-25 ENCOUNTER — Encounter: Payer: Self-pay | Admitting: Cardiology

## 2024-01-25 VITALS — BP 130/69 | HR 64 | Resp 20 | Ht <= 58 in | Wt 93.2 lb

## 2024-01-25 DIAGNOSIS — I10 Essential (primary) hypertension: Secondary | ICD-10-CM | POA: Diagnosis not present

## 2024-01-25 DIAGNOSIS — I739 Peripheral vascular disease, unspecified: Secondary | ICD-10-CM

## 2024-01-25 DIAGNOSIS — F17210 Nicotine dependence, cigarettes, uncomplicated: Secondary | ICD-10-CM

## 2024-01-25 DIAGNOSIS — R9431 Abnormal electrocardiogram [ECG] [EKG]: Secondary | ICD-10-CM | POA: Diagnosis not present

## 2024-01-25 DIAGNOSIS — E7800 Pure hypercholesterolemia, unspecified: Secondary | ICD-10-CM

## 2024-01-25 MED ORDER — ROSUVASTATIN CALCIUM 20 MG PO TABS: 20.0000 mg | ORAL_TABLET | Freq: Every day | ORAL | 1 refills | Status: DC

## 2024-01-25 NOTE — Patient Instructions (Addendum)
 Medication Instructions:  Your physician has recommended you make the following change in your medication: increase Rosuvastatin  to 20 mg by mouth daily   *If you need a refill on your cardiac medications before your next appointment, please call your pharmacy*  Lab Work: Have fasting lab work done in about 6 weeks.  (Lipid profile).  Can be done at any [facility] location.  There is a [employer] on the first floor of our building If you have labs (blood work) drawn today and your tests are completely normal, you will receive your results only by: MyChart Message (if you have MyChart) OR A paper copy in the mail If you have any lab test that is abnormal or we need to change your treatment, we will call you to review the results.  Testing/Procedures: none  Follow-Up: At [facility], you and your health needs are our priority.  As part of our continuing mission to provide you with exceptional heart care, our providers are all part of one team.  This team includes your primary Cardiologist (physician) and Advanced Practice Providers or APPs (Physician Assistants and Nurse Practitioners) who all work together to provide you with the care you need, when you need it.  Your next appointment:   August 27 at 9:40  Provider:   Gordy Bergamo, MD    We recommend signing up for the patient portal called MyChart.  Sign up information is provided on this After Visit Summary.  MyChart is used to connect with patients for Virtual Visits (Telemedicine).  Patients are able to view lab/test results, encounter notes, upcoming appointments, etc.  Non-urgent messages can be sent to your provider as well.   To learn more about what you can do with MyChart, go to [email or web address].   Other Instructions

## 2024-01-25 NOTE — Progress Notes (Signed)
 Cardiology Office Note:  .   Date:  01/25/2024  ID:  Amber Griffith, DOB July 12, 1951, MRN [id number] PCP: Joshua Debby CROME, MD  [facility] Providers Cardiologist:  Gordy Bergamo, MD   History of Present Illness: .   Amber Griffith is a 73 y.o. African-American female patient with hypertension, hypercholesterolemia, reactive airway disease with COPD and centrilobular emphysema with ongoing tobacco use, peripheral arterial disease with moderate to severely reduced ABI on the right in 2022 referred for cardiac evaluation in view of dyspnea and abnormal coronary CT angiogram revealing mild to moderate disease in the proximal LAD.  Discussed the use of AI scribe software for clinical note transcription with the patient, who gave verbal consent to proceed.  History of Present Illness Amber Griffith is a 73 year old female with peripheral artery disease who presents for a cardiovascular evaluation. She was referred by Dr. Debby Joshua for evaluation by a heart specialist.  She experiences occasional leg cramps, particularly in the left calf, sometimes upon waking. ABI in 2022 showed severe blockage in the right leg and moderate to severe blockage in the left leg. She smokes half a pack of cigarettes daily and is using nicotine  patches. She consumes about three beers daily but has recently reduced her intake.  Her family history includes the recent loss of her son at age 47 due to a rare blood disease. She has no surviving immediate family members. No chest pain or shortness of breath during activities such as shopping.  Labs   Lab Results  Component Value Date   CHOL 172 05/17/2023   HDL 57.40 05/17/2023   LDLCALC 92 05/17/2023   LDLDIRECT 84.0 01/25/2018   TRIG 112.0 05/17/2023   CHOLHDL 3 05/17/2023   Lab Results  Component Value Date   NA 138 12/27/2023   K 4.7 12/27/2023   CO2 27 09/19/2023   GLUCOSE 88 12/27/2023   BUN 14 12/27/2023   CREATININE 0.80 12/27/2023    CALCIUM  8.9 09/19/2023   GFR 64.49 09/19/2023   GFRNONAA >60 12/05/2022      Latest Ref Rng & Units 12/27/2023   12:21 PM 09/19/2023   11:16 AM 04/19/2023   11:30 AM  BMP  Glucose 70 - 99 mg/dL 88  97  72   BUN 8 - 23 mg/dL 14  18  21    Creatinine 0.44 - 1.00 mg/dL 9.19  9.09  9.01   Sodium 135 - 145 mmol/L 138  143  140   Potassium 3.5 - 5.1 mmol/L 4.7  3.1  3.5   Chloride 98 - 111 mmol/L 104  106  102   CO2 19 - 32 mEq/L  27  32   Calcium  8.4 - 10.5 mg/dL  8.9  9.5       Latest Ref Rng & Units 12/27/2023   12:21 PM 09/19/2023   11:16 AM 04/19/2023   11:30 AM  CBC  WBC 4.0 - 10.5 K/uL  7.3  6.9   Hemoglobin 12.0 - 15.0 g/dL 85.3  87.1  87.1   Hematocrit 36.0 - 46.0 % 43.0  39.1  39.8   Platelets 150.0 - 400.0 K/uL  247.0  244.0    Lab Results  Component Value Date   HGBA1C 4.9 07/30/2013    Lab Results  Component Value Date   TSH 1.08 05/17/2023     ROS  Review of Systems  Cardiovascular:  Negative for chest pain, dyspnea on exertion and leg swelling.   Physical  Exam:   VS:  BP 130/69 (BP Location: Left Arm, Patient Position: Sitting, Cuff Size: Normal)   Pulse 64   Resp 20   Ht 4' 10 (1.473 m)   Wt 93 lb 3.2 oz (42.3 kg)   SpO2 98%   BMI 19.48 kg/m    Wt Readings from Last 3 Encounters:  01/25/24 93 lb 3.2 oz (42.3 kg)  01/17/24 94 lb 9.6 oz (42.9 kg)  09/19/23 97 lb 6.4 oz (44.2 kg)    Physical Exam Neck:     Vascular: No carotid bruit or JVD.   Cardiovascular:     Rate and Rhythm: Normal rate and regular rhythm.     Pulses: Intact distal pulses.          Popliteal pulses are 0 on the right side and 0 on the left side.       Dorsalis pedis pulses are 0 on the right side and 0 on the left side.       Posterior tibial pulses are 0 on the right side and 0 on the left side.     Heart sounds: Normal heart sounds. No murmur heard.    No gallop.  Pulmonary:     Effort: Pulmonary effort is normal.     Breath sounds: Normal breath sounds.  Abdominal:      General: Bowel sounds are normal.     Palpations: Abdomen is soft.   Musculoskeletal:     Right lower leg: No edema.     Left lower leg: No edema.   Skin:    General: Skin is warm.     Capillary Refill: Capillary refill takes less than 2 seconds.    Studies Reviewed: SABRA    ABI 01/17/2021: Right: Resting right ankle-brachial index 0.55 indicates moderate right lower extremity arterial disease.  The right toe-brachial index is abnormal.   Left: Resting left ankle-brachial index of 0.77 indicates moderate left lower extremity arterial disease.  The left toe-brachial index is abnormal.   Bilateral PTA waveform absent suggest CTO  CT CORONARY MORPH W/CTA COR W/SCORE 12/27/2023  1. Coronary calcium  score of 225. This was 42 percentile for age and sex matched control.  2. Normal coronary origin with right dominance.  3. CAD-RADS 3. Moderate stenosis. Consider symptom-guided anti-ischemic pharmacotherapy as well as risk factor modification per guideline directed care  RCA is a large dominant artery that gives rise to PDA and PLA. There are two separate mild (24-49%) mixed plaques in the proximal RCA. LAD is a large vessel. There is a moderate (50-69%) focal calcification in the proximal portion of the LAD. LCX is a non-dominant artery that gives rise to one large OM1 branch. There is minimal soft plaque in the mid portion of the LCX vessel.    EKG:    EKG Interpretation Date/Time:  Wednesday January 25 2024 10:48:11 EDT Ventricular Rate:  66 PR Interval:  196 QRS Duration:  84 QT Interval:  410 QTC Calculation: 429 R Axis:   58  Text Interpretation: EKG 01/25/2024: Normal sinus rhythm at rate of 66 bpm, LVH with repolarization abnormality, cannot exclude inferior ischemia and anterolateral ischemia.  Compared to 09/23/2009, inferolateral T wave inversion new.  Compared to 09/19/2023, T wave inversion in the inferior leads are new, chest leads ordered.  Compared to 05/18/2022, T  wave inversion in the inferior leads and lateral leads new. Reconfirmed by Mylie Mccurley, Jagadeesh ([id number]) on 01/25/2024 11:08:29 AM    Medications ordered    Meds ordered this  encounter  Medications   rosuvastatin  (CRESTOR ) 20 MG tablet    Sig: Take 1 tablet (20 mg total) by mouth daily.    Dispense:  90 tablet    Refill:  1     ASSESSMENT AND PLAN: .      ICD-10-CM   1. Abnormal electrocardiogram (ECG) (EKG)  R94.31 EKG 12-Lead    2. PAD (peripheral artery disease) (HCC)  I73.9 rosuvastatin  (CRESTOR ) 20 MG tablet    Lipid Profile    3. Primary hypertension  I10     4. Pure hypercholesterolemia  E78.00 rosuvastatin  (CRESTOR ) 20 MG tablet    Lipid Profile    5. Cigarette smoker motivated to quit  F17.210       Assessment and Plan Assessment & Plan  Abnormal EKG Patient referred with abnormal EKG, changes have related to chronic hypertension but blood pressure is well-controlled.  Do not think she needs ischemic evaluation in the absence of symptoms and also coronary CT angiogram on 12/27/2023 revealing only moderate disease in the LAD and needs primary/secondary prevention.  Please see below.  Peripheral Artery Disease Severe blockage in the right leg and moderate to severe blockage in the left leg, likely due to smoking. he experiences leg cramps, particularly in the left leg, when walking. Walking can help develop collateral circulation, potentially avoiding surgical intervention. Symptoms are verymild and presently appears to be on Xarelto  2.5 mg BID and ASA 81 mg daily for the same (managed by PCP).  - Encourage daily walking to improve circulation and develop natural bypass channels. - Increase cholesterol medication to 20 mg daily to protect circulation. - Schedule follow-up in 6-8 weeks to monitor progress.  Tobacco Use Disorder She smokes a pack of cigarettes every two days and is ambivalent about quitting. Smoking contributes to peripheral artery disease and poses  significant health risks. Smoking cessation is crucial to prevent further vascular damage and potential limb loss. - Encourage smoking cessation to improve health and circulation.  Hyperlipidemia LDL cholesterol level is 84, which is not optimal for her condition. Increased cholesterol medication to manage hyperlipidemia and protect against further vascular complications. The decision to increase the dosage is aimed at better managing her lipid levels to reduce cardiovascular risk. - Increase Crestor  to 20 mg daily. - Monitor lipid levels in 6 weeks. -Goal LDL <70.  Alcohol Use She consumes 2-3 beers daily. Advised that 1-2 beers per day is acceptable, but 3 is excessive. Discussed moderation in alcohol consumption to prevent additional health issues. - Advise limiting alcohol consumption to 1-2 beers per day.  I will see her back in 3 months to assess her symptoms specifically for PAD and also follow-up on lipids and if she remains stable I will see her back on a as needed basis.  Signed,  Gordy Bergamo, MD, [facility] 01/25/2024, 8:35 PM [facility] [address] [place], [place] [address] Phone: [phone]. Fax:  [phone]

## 2024-01-26 ENCOUNTER — Telehealth: Payer: Self-pay | Admitting: *Deleted

## 2024-01-26 ENCOUNTER — Ambulatory Visit: Payer: Self-pay | Admitting: Internal Medicine

## 2024-01-26 ENCOUNTER — Ambulatory Visit (HOSPITAL_COMMUNITY)
Admission: RE | Admit: 2024-01-26 | Discharge: 2024-01-26 | Disposition: A | Discharge status: Home / Self Care | Source: Ambulatory Visit | Attending: Cardiovascular Disease | Admitting: Cardiovascular Disease

## 2024-01-26 DIAGNOSIS — R9431 Abnormal electrocardiogram [ECG] [EKG]: Secondary | ICD-10-CM

## 2024-01-26 LAB — ECHOCARDIOGRAM COMPLETE
Area-P 1/2: 2.3 cm2
S' Lateral: 2 cm

## 2024-01-26 NOTE — Progress Notes (Signed)
 Care Guide Pharmacy Note  01/26/2024 Name: Amber Griffith MRN: [id number] DOB: [birth year]  Referred By: Joshua Debby CROME, MD Reason for referral: Call Attempt #1 and Complex Care Management (Outreach to schedule referral with pharmacist )   Amber Griffith is a 73 y.o. year old female who is a primary care patient of Joshua Debby CROME, MD.  Amber Griffith was referred to the pharmacist for assistance related to: HTN  An unsuccessful telephone outreach was attempted today to contact the patient who was referred to the pharmacy team for assistance with medication management. Additional attempts will be made to contact the patient.  Thedford Franks, CMA [place]  [facility], [facility] Guide Direct Dial: [phone]  Fax: [phone] Website: [email or web address]

## 2024-01-27 NOTE — Progress Notes (Signed)
 Care Guide Pharmacy Note  01/27/2024 Name: Britani Beattie MRN: [id number] DOB: [birth year]  Referred By: Joshua Debby CROME, MD Reason for referral: Call Attempt #1 and Complex Care Management (Outreach to schedule referral with pharmacist )   Remonia Ezzard [place] is a 73 y.o. year old female who is a primary care patient of Joshua Debby CROME, MD.  Remonia Ezzard Mouton was referred to the pharmacist for assistance related to: HTN  An unsuccessful telephone outreach was attempted today to contact the patient who was referred to the pharmacy team for assistance with medication management. Additional attempts will be made to contact the patient.  Thedford Franks, CMA [facility], [facility] Guide Direct Dial: [phone]  Fax: [phone] Website: [email or web address]

## 2024-02-09 ENCOUNTER — Other Ambulatory Visit: Admitting: Pharmacist

## 2024-02-09 DIAGNOSIS — J4489 Other specified chronic obstructive pulmonary disease: Secondary | ICD-10-CM

## 2024-02-09 DIAGNOSIS — I10 Essential (primary) hypertension: Secondary | ICD-10-CM

## 2024-02-09 DIAGNOSIS — I779 Disorder of arteries and arterioles, unspecified: Secondary | ICD-10-CM

## 2024-02-09 NOTE — Patient Instructions (Signed)
 It was a pleasure speaking with you today!  Continue your current regimen.  It is very important to take your medications every day, including your Trelegy inhaler.  Feel free to call with any questions or concerns!  Darrelyn Drum, PharmD, BCPS, CPP Clinical Pharmacist Practitioner [facility] at [facility] [phone]

## 2024-02-09 NOTE — Progress Notes (Signed)
 02/09/2024 Name: Kaye Luoma MRN: [id number] DOB: [birth year]  Chief Complaint  Patient presents with   Medication Management    Derra Shartzer is a 73 y.o. year old female who presented for a telephone visit.   They were referred to the pharmacist by their PCP for assistance in managing COPD and HTN.    Subjective:  Care Team: Primary Care Provider: Joshua Debby CROME, MD ; Next Scheduled Visit: 03/19/24   Medication Access/Adherence  Current Pharmacy:  [id number] [address], [place] - [address] AT [facility] [address] [place] [address] Phone: [phone] Fax: [phone]   Patient reports affordability concerns with their medications: No  Patient reports access/transportation concerns to their pharmacy: No  Patient reports adherence concerns with their medications:  Yes    Fill history shows her medications are up to date however were 3-4+ months late for refill last time  PAD: Current medications: aspirin  81 mg daily, rosuvastatin  20 mg daily, Xarelto  2.5 mg twice daily Denies pain in her legs at this time.   COPD: Current medications: trelegy 1 puff once daily, albuterol  PRN (pt is out)  Pt reports no affordability issues with inhaler  Smoking 1/2 PPD. Have previously prescribed nicotine  patches.  HTN: Current medications: spironolactone  25 mg daily, nebivolol  5 mg daily  BP was improved at vascular appt (after meds were refilled)  Objective: BP Readings from Last 3 Encounters:  01/25/24 130/69  01/17/24 (!) 172/84  12/27/23 (!) 150/76       Lab Results  Component Value Date   HGBA1C 4.9 07/30/2013    Lab Results  Component Value Date   CREATININE 0.80 12/27/2023   BUN 14 12/27/2023   NA 138 12/27/2023   K 4.7 12/27/2023   CL 104 12/27/2023   CO2 27 09/19/2023    Lab Results  Component Value Date   CHOL 172 05/17/2023   HDL 57.40 05/17/2023   LDLCALC 92 05/17/2023   LDLDIRECT 84.0  01/25/2018   TRIG 112.0 05/17/2023   CHOLHDL 3 05/17/2023    Medications Reviewed Today     Reviewed by Merceda Lela SAUNDERS, RPH (Pharmacist) on 02/09/24 at 1327  Med List Status: <None>   Medication Order Taking? Sig Documenting Provider Last Dose Status Informant  albuterol  (VENTOLIN  HFA) 108 (90 Base) MCG/ACT inhaler [id number]  TAKE 2 PUFFS BY MOUTH EVERY 6 HOURS AS NEEDED FOR WHEEZE OR SHORTNESS OF BREATH Geofm Glade PARAS, MD  Active   aspirin  EC 81 MG tablet [id number]  Take 1 tablet (81 mg total) by mouth daily. SWALLOW WHOLE. Joshua Debby CROME, MD  Active   [facility] D3 125 MCG (5000 UT) capsule [id number]  TAKE 1 CAPSULE BY MOUTH EVERY DAY Joshua Debby CROME, MD  Active   [facility] HEARTBURN RELIEF EX ST 254-237.5 MG/5ML SUSP [id number]  TAKE 5 MLS BY MOUTH 3 (THREE) TIMES DAILY AS NEEDED. Joshua Debby CROME, MD  Active   Fluticasone -Umeclidin-Vilant (TRELEGY ELLIPTA ) 100-62.5-25 MCG/ACT AEPB [id number] Yes Inhale 1 puff into the lungs daily. Joshua Debby CROME, MD  Active   nebivolol  (BYSTOLIC ) 5 MG tablet [id number] Yes Take 1 tablet (5 mg total) by mouth daily. Joshua Debby CROME, MD  Active   nicotine  (NICODERM CQ  - DOSED IN MG/24 HOURS) 21 mg/24hr patch [id number]  PLACE 1 PATCH ONTO THE SKIN DAILY. Joshua Debby CROME, MD  Active   omeprazole  (PRILOSEC) 40 MG capsule [id number]  Take 1 capsule (40 mg total) by mouth 2 (two)  times daily before a meal.  Patient taking differently: Take 40 mg by mouth daily.   Stacia Glendia BRAVO, MD  Active   rivaroxaban  (XARELTO ) 2.5 MG TABS tablet [id number] Yes Take 1 tablet (2.5 mg total) by mouth 2 (two) times daily. Joshua Debby CROME, MD  Active   rosuvastatin  (CRESTOR ) 20 MG tablet [id number] Yes Take 1 tablet (20 mg total) by mouth daily. Ladona Heinz, MD  Active   spironolactone  (ALDACTONE ) 25 MG tablet [id number] Yes Take 1 tablet (25 mg total) by mouth daily. Joshua Debby CROME, MD  Active               Assessment/Plan:   PAD - Currently optimally controlled - Reviewed  importance of adherence to Xarelto  and rosuvastatin  - LDL goal <70 due to hx of PAD. Last LDL was elevated. Likely due to med nonadherence. Rosuvastatin  recently increased from 5 mg to 20 mg - Reviewed importance of smoking cessation  COPD: - Currently controlled.  - Remined pt to take Trelegy DAILY, not PRN.  - Reviewed difference between maintenance and rescue inhalers   HTN: - Currently borderline controlled, BP goal <130/80 - Discussed importance of medication adherence   Follow Up Plan: PRN  Darrelyn Drum, PharmD, BCPS Clinical Pharmacist [facility] at [facility] [phone]

## 2024-03-08 ENCOUNTER — Other Ambulatory Visit: Payer: Self-pay | Admitting: Internal Medicine

## 2024-03-08 DIAGNOSIS — I779 Disorder of arteries and arterioles, unspecified: Secondary | ICD-10-CM

## 2024-03-19 ENCOUNTER — Encounter: Payer: Self-pay | Admitting: Internal Medicine

## 2024-03-19 ENCOUNTER — Ambulatory Visit: Payer: Medicare PPO | Admitting: Internal Medicine

## 2024-03-19 VITALS — BP 150/76 | HR 49 | Temp 97.7°F | Ht <= 58 in | Wt 92.8 lb

## 2024-03-19 DIAGNOSIS — R001 Bradycardia, unspecified: Secondary | ICD-10-CM

## 2024-03-19 DIAGNOSIS — Z1231 Encounter for screening mammogram for malignant neoplasm of breast: Secondary | ICD-10-CM | POA: Insufficient documentation

## 2024-03-19 DIAGNOSIS — Z1211 Encounter for screening for malignant neoplasm of colon: Secondary | ICD-10-CM

## 2024-03-19 DIAGNOSIS — F320 Major depressive disorder, single episode, mild: Secondary | ICD-10-CM | POA: Diagnosis not present

## 2024-03-19 DIAGNOSIS — I10 Essential (primary) hypertension: Secondary | ICD-10-CM | POA: Diagnosis not present

## 2024-03-19 LAB — BASIC METABOLIC PANEL WITH GFR
BUN: 11 mg/dL (ref 6–23)
CO2: 28 meq/L (ref 19–32)
Calcium: 9.2 mg/dL (ref 8.4–10.5)
Chloride: 104 meq/L (ref 96–112)
Creatinine, Ser: 0.79 mg/dL (ref 0.40–1.20)
GFR: 75.15 mL/min (ref >60.00)
Glucose, Bld: 91 mg/dL (ref 70–99)
Potassium: 3.5 meq/L (ref 3.5–5.1)
Sodium: 141 meq/L (ref 135–145)

## 2024-03-19 LAB — CBC WITH DIFFERENTIAL/PLATELET
Basophils Absolute: 0.1 K/uL (ref 0.0–0.1)
Basophils Relative: 1 % (ref 0.0–3.0)
Eosinophils Absolute: 0.1 K/uL (ref 0.0–0.7)
Eosinophils Relative: 1.1 % (ref 0.0–5.0)
HCT: 40.4 % (ref 36.0–46.0)
Hemoglobin: 13.1 g/dL (ref 12.0–15.0)
Lymphocytes Relative: 39.1 % (ref 12.0–46.0)
Lymphs Abs: 2.3 K/uL (ref 0.7–4.0)
MCHC: 32.3 g/dL (ref 30.0–36.0)
MCV: 90.8 fl (ref 78.0–100.0)
Monocytes Absolute: 0.5 K/uL (ref 0.1–1.0)
Monocytes Relative: 8.9 % (ref 3.0–12.0)
Neutro Abs: 2.9 K/uL (ref 1.4–7.7)
Neutrophils Relative %: 49.9 % (ref 43.0–77.0)
Platelets: 189 K/uL (ref 150.0–400.0)
RBC: 4.45 Mil/uL (ref 3.87–5.11)
RDW: 13.5 % (ref 11.5–15.5)
WBC: 5.9 K/uL (ref 4.0–10.5)

## 2024-03-19 MED ORDER — MIRTAZAPINE 7.5 MG PO TABS: 7.5000 mg | ORAL_TABLET | Freq: Every day | ORAL | 0 refills | Status: DC

## 2024-03-19 NOTE — Progress Notes (Unsigned)
 Subjective:  Patient ID: Amber Griffith, female    DOB: 1951/03/25  Age: 73 y.o. MRN: [id number]  CC: Medical Management of Chronic Issues (6 month follow up.) and Medication Problem (Patient says that the patch she uses is causing her arm pain, itching and it makes her feel sick. )   HPI Amber Griffith presents for f/up -----  Discussed the use of AI scribe software for clinical note transcription with the patient, who gave verbal consent to proceed.  History of Present Illness Amber Griffith is a 73 year old female who presents with concerns about low heart rate and stress.  She feels 'all right' but identifies stress as a significant factor in her life. She experiences stress and sadness, particularly following the recent death of her son, Amber Griffith, in March from a rare blood disease. This has led to episodes of crying.  No symptoms of coughing, wheezing, chest pain, shortness of breath, abdominal pain, nausea, vomiting, weakness, dizziness, or lightheadedness.  She mentions a history of regular mammograms and is up to date with her screenings, which are typically done at [facility] on [address]. She notes the need for someone to accompany her for a colonoscopy, which has not been scheduled due to lack of support.      Outpatient Medications Prior to Visit  Medication Sig Dispense Refill   albuterol  (VENTOLIN  HFA) 108 (90 Base) MCG/ACT inhaler TAKE 2 PUFFS BY MOUTH EVERY 6 HOURS AS NEEDED FOR WHEEZE OR SHORTNESS OF BREATH 6.7 each 4   aspirin  EC 81 MG tablet Take 1 tablet (81 mg total) by mouth daily. SWALLOW WHOLE. 90 tablet 1   [facility] D3 125 MCG (5000 UT) capsule TAKE 1 CAPSULE BY MOUTH EVERY DAY 90 capsule 1   [facility] HEARTBURN RELIEF EX ST 254-237.5 MG/5ML SUSP TAKE 5 MLS BY MOUTH 3 (THREE) TIMES DAILY AS NEEDED. 355 mL 0   Fluticasone -Umeclidin-Vilant (TRELEGY ELLIPTA ) 100-62.5-25 MCG/ACT AEPB Inhale 1 puff into the lungs daily. 120 each 0   nebivolol   (BYSTOLIC ) 5 MG tablet Take 1 tablet (5 mg total) by mouth daily. 90 tablet 0   omeprazole  (PRILOSEC) 40 MG capsule Take 1 capsule (40 mg total) by mouth 2 (two) times daily before a meal. (Patient taking differently: Take 40 mg by mouth daily.) 180 capsule 3   rivaroxaban  (XARELTO ) 2.5 MG TABS tablet TAKE 1 TABLET BY MOUTH TWICE A DAY 180 tablet 1   rosuvastatin  (CRESTOR ) 20 MG tablet Take 1 tablet (20 mg total) by mouth daily. 90 tablet 1   spironolactone  (ALDACTONE ) 25 MG tablet Take 1 tablet (25 mg total) by mouth daily. 90 tablet 0   nicotine  (NICODERM CQ  - DOSED IN MG/24 HOURS) 21 mg/24hr patch PLACE 1 PATCH ONTO THE SKIN DAILY. (Patient not taking: Reported on 03/19/2024) 28 patch 0   No facility-administered medications prior to visit.    ROS Review of Systems  Constitutional:  Positive for unexpected weight change. Negative for appetite change, chills, diaphoresis and fatigue.  HENT: Negative.    Respiratory: Negative.  Negative for cough, chest tightness, shortness of breath and wheezing.   Cardiovascular:  Negative for chest pain, palpitations and leg swelling.  Gastrointestinal: Negative.  Negative for abdominal pain, blood in stool, constipation, diarrhea, nausea and vomiting.  Genitourinary: Negative.  Negative for difficulty urinating.  Musculoskeletal: Negative.   Skin: Negative.   Neurological: Negative.   Hematological:  Negative for adenopathy. Does not bruise/bleed easily.  Psychiatric/Behavioral:  Positive for agitation and dysphoric  mood. Negative for confusion, decreased concentration, self-injury and sleep disturbance. The patient is nervous/anxious.     Objective:  BP (!) 150/76 (BP Location: Left Arm, Patient Position: Sitting, Cuff Size: Small)   Pulse (!) 49   Temp 97.7 F (36.5 C) (Oral)   Ht 4' 10 (1.473 m)   Wt 92 lb 12.8 oz (42.1 kg)   SpO2 98%   BMI 19.40 kg/m   BP Readings from Last 3 Encounters:  03/19/24 (!) 150/76  01/25/24 130/69  01/17/24  (!) 172/84    Wt Readings from Last 3 Encounters:  03/19/24 92 lb 12.8 oz (42.1 kg)  01/25/24 93 lb 3.2 oz (42.3 kg)  01/17/24 94 lb 9.6 oz (42.9 kg)    Physical Exam Vitals reviewed.  Constitutional:      Appearance: Normal appearance.  HENT:     Nose: Nose normal.     Mouth/Throat:     Mouth: Mucous membranes are moist.  Eyes:     General: No scleral icterus.    Conjunctiva/sclera: Conjunctivae normal.  Cardiovascular:     Rate and Rhythm: Regular rhythm. Bradycardia present.     Heart sounds: No murmur heard.    No friction rub. No gallop.     Comments: EKG- SB (new), 50 bpm LAE No LVH, Q waves, or ST/T wave changes  Pulmonary:     Effort: Pulmonary effort is normal.     Breath sounds: No stridor. No wheezing, rhonchi or rales.  Abdominal:     General: Abdomen is flat.     Palpations: There is no mass.     Tenderness: There is no abdominal tenderness. There is no guarding.     Hernia: No hernia is present.  Musculoskeletal:        General: Normal range of motion.     Cervical back: Neck supple.     Right lower leg: No edema.     Left lower leg: No edema.  Lymphadenopathy:     Cervical: No cervical adenopathy.  Skin:    General: Skin is warm and dry.  Neurological:     General: No focal deficit present.     Mental Status: She is alert.  Psychiatric:        Mood and Affect: Mood normal.        Behavior: Behavior normal.     Lab Results  Component Value Date   WBC 5.9 03/19/2024   HGB 13.1 03/19/2024   HCT 40.4 03/19/2024   PLT 189.0 03/19/2024   GLUCOSE 91 03/19/2024   CHOL 172 05/17/2023   TRIG 112.0 05/17/2023   HDL 57.40 05/17/2023   LDLDIRECT 84.0 01/25/2018   LDLCALC 92 05/17/2023   ALT 13 09/19/2023   AST 16 09/19/2023   NA 141 03/19/2024   K 3.5 03/19/2024   CL 104 03/19/2024   CREATININE 0.79 03/19/2024   BUN 11 03/19/2024   CO2 28 03/19/2024   TSH 0.90 03/19/2024   INR 1.1 12/05/2022   HGBA1C 4.9 07/30/2013    ECHOCARDIOGRAM  COMPLETE Result Date: 01/26/2024    ECHOCARDIOGRAM REPORT   Patient Name:   Amber Griffith [facility] Date of Exam: 01/26/2024 Medical Rec #:  [id number]            Height:       58.0 in Accession #:    [id number]           Weight:       93.2 lb Date of Birth:  November 24, 1950  BSA:          1.316 m Patient Age:    73 years             BP:           130/69 mmHg Patient Gender: F                    HR:           75 bpm. Exam Location:  [address] Procedure: 2D Echo, Cardiac Doppler, Color Doppler, 3D Echo and Strain Analysis            (Both Spectral and Color Flow Doppler were utilized during            procedure). Indications:    R94.31 Abnormal EKG  History:        Patient has no prior history of Echocardiogram examinations.                 Abnormal ECG, COPD; Risk Factors:Hypertension, Dyslipidemia and                 Current Smoker.  Sonographer:    Amber Griffith RDCS Referring Phys: [id number] DEBBY LITTIE MOLT IMPRESSIONS  1. Left ventricular ejection fraction, by estimation, is 60 to 65%. Left ventricular ejection fraction by 3D volume is 66 %. The left ventricle has normal function. The left ventricle has no regional wall motion abnormalities. Left ventricular diastolic  parameters were normal. The average left ventricular global longitudinal strain is -20.5 %. The global longitudinal strain is normal.  2. Right ventricular systolic function is normal. The right ventricular size is normal.  3. The mitral valve is normal in structure. No evidence of mitral valve regurgitation. No evidence of mitral stenosis.  4. The aortic valve is normal in structure. Aortic valve regurgitation is not visualized. No aortic stenosis is present.  5. The inferior vena cava is normal in size with greater than 50% respiratory variability, suggesting right atrial pressure of 3 mmHg. FINDINGS  Left Ventricle: Left ventricular ejection fraction, by estimation, is 60 to 65%. Left ventricular ejection fraction by 3D volume is 66 %. The  left ventricle has normal function. The left ventricle has no regional wall motion abnormalities. The average left ventricular global longitudinal strain is -20.5 %. Strain was performed and the global longitudinal strain is normal. The left ventricular internal cavity size was normal in size. There is no left ventricular hypertrophy. Left ventricular diastolic parameters were normal. Right Ventricle: The right ventricular size is normal. No increase in right ventricular wall thickness. Right ventricular systolic function is normal. Left Atrium: Left atrial size was normal in size. Right Atrium: Right atrial size was normal in size. Pericardium: There is no evidence of pericardial effusion. Mitral Valve: The mitral valve is normal in structure. No evidence of mitral valve regurgitation. No evidence of mitral valve stenosis. Tricuspid Valve: The tricuspid valve is normal in structure. Tricuspid valve regurgitation is mild . No evidence of tricuspid stenosis. Aortic Valve: The aortic valve is normal in structure. Aortic valve regurgitation is not visualized. No aortic stenosis is present. Pulmonic Valve: The pulmonic valve was normal in structure. Pulmonic valve regurgitation is not visualized. No evidence of pulmonic stenosis. Aorta: The aortic root is normal in size and structure. Venous: The inferior vena cava is normal in size with greater than 50% respiratory variability, suggesting right atrial pressure of 3 mmHg. IAS/Shunts: No atrial level shunt detected by color flow Doppler. Additional Comments: 3D  was performed not requiring image post processing on an independent workstation and was normal.  LEFT VENTRICLE PLAX 2D LVIDd:         3.30 cm         Diastology LVIDs:         2.00 cm         LV e' medial:    5.66 cm/s LV PW:         1.00 cm         LV E/e' medial:  12.3 LV IVS:        1.30 cm         LV e' lateral:   6.53 cm/s LVOT diam:     1.80 cm         LV E/e' lateral: 10.7 LV SV:         51 LV SV Index:    39              2D Longitudinal LVOT Area:     2.54 cm        Strain                                2D Strain GLS   -19.5 %                                (A4C):                                2D Strain GLS   -19.1 %                                (A3C):                                2D Strain GLS   -23.0 %                                (A2C):                                2D Strain GLS   -20.5 %                                Avg:                                 3D Volume EF                                LV 3D EF:    Left                                             ventricul  ar                                             ejection                                             fraction                                             by 3D                                             volume is                                             66 %.                                 3D Volume EF:                                3D EF:        66 %                                LV EDV:       83 ml                                LV ESV:       28 ml                                LV SV:        55 ml RIGHT VENTRICLE             IVC RV S prime:     10.10 cm/s  IVC diam: 0.80 cm TAPSE (M-mode): 1.6 cm RVSP:           30.5 mmHg LEFT ATRIUM             Index        RIGHT ATRIUM           Index LA diam:        3.10 cm 2.35 cm/m   RA Pressure: 3.00 mmHg LA Vol (A2C):   27.6 ml 20.97 ml/m  RA Area:     11.20 cm LA Vol (A4C):   21.9 ml 16.64 ml/m  RA Volume:   28.00 ml  21.27 ml/m LA Biplane Vol: 24.4 ml 18.53 ml/m  AORTIC VALVE LVOT Vmax:   93.40 cm/s LVOT Vmean:  55.500 cm/s LVOT VTI:    0.202 m  AORTA Ao Root diam:  2.80 cm Ao Asc diam:  3.00 cm MITRAL VALVE               TRICUSPID VALVE MV Area (PHT): 2.30 cm    TR Peak grad:   27.5 mmHg MV Decel Time: 330 msec    TR Vmax:        262.00 cm/s MV E velocity: 69.70 cm/s  Estimated RAP:  3.00 mmHg MV A velocity: 95.40 cm/s  RVSP:            30.5 mmHg MV E/A ratio:  0.73                            SHUNTS                            Systemic VTI:  0.20 m                            Systemic Diam: 1.80 cm Amber Griffith Electronically signed by Amber Griffith Signature Date/Time: 01/26/2024/1:44:17 PM    Final     Assessment & Plan:  Bradycardia- She is asx with this. -     EKG 12-Lead -     Thyroid  Panel With TSH; Future  Screening for colon cancer -     Ambulatory referral to Gastroenterology  Screening mammogram for breast cancer -     Digital Screening Mammogram, Left and Right; Future  Essential hypertension- Her BP is well controlled. -     Basic metabolic panel with GFR; Future -     CBC with Differential/Platelet; Future -     Thyroid  Panel With TSH; Future  Current mild episode of major depressive disorder without prior episode (HCC) -     Mirtazapine ; Take 1 tablet (7.5 mg total) by mouth at bedtime.  Dispense: 90 tablet; Refill: 0     Follow-up: Return in about 3 months (around 06/19/2024).  Debby Molt, MD

## 2024-03-19 NOTE — Patient Instructions (Signed)
 Bradycardia, Adult Bradycardia is a slower-than-normal heartbeat. A normal resting heart rate for an adult ranges from 60 to 100 beats per minute. With bradycardia, the resting heart rate is less than 60 beats per minute. Bradycardia can prevent enough oxygen  from reaching certain areas of your body when you are active. It can be serious if it keeps enough oxygen  from reaching your brain and other parts of your body. Bradycardia is not a problem for everyone. For some healthy adults, a slow resting heart rate is normal. What are the causes? This condition may be caused by: A problem with the heart, including: A problem with the heart's electrical system, such as a heart block. With a heart block, electrical signals between the chambers of the heart are partially or completely blocked, so they are not able to work as they should. A problem with the heart's natural pacemaker (sinus node). Heart disease. A heart attack. Heart damage. Lyme disease. A heart infection. A heart condition that is present at birth (congenital heart defect). Certain medicines that treat heart conditions. Certain conditions, such as hypothyroidism and obstructive sleep apnea. Problems with the balance of chemicals and other substances, like potassium, in the blood. Trauma. Radiation therapy. What increases the risk? You are more likely to develop this condition if you: Are age 30 or older. Have high blood pressure (hypertension), high cholesterol (hyperlipidemia), or diabetes. Drink heavily, use tobacco or nicotine products, or use drugs. What are the signs or symptoms? Symptoms of this condition include: Light-headedness. Feeling faint or fainting. Fatigue and weakness. Trouble with activity or exercise. Shortness of breath. Chest pain (angina). Drowsiness. Confusion. Dizziness. How is this diagnosed? This condition may be diagnosed based on: Your symptoms. Your medical history. A physical exam. During  the exam, your health care provider will listen to your heartbeat and check your pulse. To confirm the diagnosis, your health care provider may order tests, such as: Blood tests. An electrocardiogram (ECG). This test records the heart's electrical activity. The test can show how fast your heart is beating and whether the heartbeat is steady. A test in which you wear a portable device (event recorder or Holter monitor) to record your heart's electrical activity while you go about your day. An exercise test. How is this treated? Treatment for this condition depends on the cause of the condition and how severe your symptoms are. Treatment may involve: Treatment of the underlying condition. Changing your medicines or how much medicine you take. Having a small, battery-operated device called a pacemaker implanted under the skin. When bradycardia occurs, this device can be used to increase your heart rate and help your heart beat in a regular rhythm. Follow these instructions at home: Lifestyle Manage any health conditions that contribute to bradycardia as told by your health care provider. Follow a heart-healthy diet. A nutrition specialist (dietitian) can help educate you about healthy food options and changes. Follow an exercise program that is approved by your health care provider. Maintain a healthy weight. Try to reduce or manage your stress, such as with yoga or meditation. If you need help reducing stress, ask your health care provider. Do not use any products that contain nicotine or tobacco. These products include cigarettes, chewing tobacco, and vaping devices, such as e-cigarettes. If you need help quitting, ask your health care provider. Do not use illegal drugs. Alcohol  use If you drink alcohol : Limit how much you have to: 0-1 drink a day for women who are not pregnant. 0-2 drinks a day  for men. Know how much alcohol  is in a drink. In the [place] one drink equals one 12 oz bottle of  beer (355 mL), one 5 oz glass of wine (148 mL), or one 1 oz glass of hard liquor (44 mL). General instructions Take over-the-counter and prescription medicines only as told by your health care provider. Keep all follow-up visits. This is important. How is this prevented? In some cases, bradycardia may be prevented by: Treating underlying medical problems. Stopping behaviors or medicines that can trigger the condition. Contact a health care provider if: You feel light-headed or dizzy. You almost faint. You feel weak or are easily fatigued during physical activity. You experience confusion or have memory problems. Get help right away if: You faint. You have chest pains or an irregular heartbeat (palpitations). You have trouble breathing. These symptoms may represent a serious problem that is an emergency. Do not wait to see if the symptoms will go away. Get medical help right away. Call your local emergency services (911 in the [place] Do not drive yourself to the hospital. Summary Bradycardia is a slower-than-normal heartbeat. With bradycardia, the resting heart rate is less than 60 beats per minute. Treatment for this condition depends on the cause. Manage any health conditions that contribute to bradycardia as told by your health care provider. Do not use any products that contain nicotine or tobacco. These products include cigarettes, chewing tobacco, and vaping devices, such as e-cigarettes. Keep all follow-up visits. This is important. This information is not intended to replace advice given to you by your health care provider. Make sure you discuss any questions you have with your health care provider. Document Revised: 11/09/2020 Document Reviewed: 11/09/2020 Elsevier Patient Education  2024 [employer].

## 2024-03-20 ENCOUNTER — Telehealth: Payer: Self-pay

## 2024-03-20 ENCOUNTER — Ambulatory Visit: Payer: Self-pay | Admitting: Internal Medicine

## 2024-03-20 LAB — THYROID PANEL WITH TSH
Free Thyroxine Index: 1.9 (ref 1.4–3.8)
T3 Uptake: 35 % (ref 22–35)
T4, Total: 5.5 ug/dL (ref 5.1–11.9)
TSH: 0.9 m[IU]/L (ref 0.40–4.50)

## 2024-03-20 NOTE — Telephone Encounter (Signed)
 Copied from CRM [id number]. Topic: Clinical - Medication Question >> Mar 20, 2024  8:46 AM Roselie BROCKS wrote: Reason for CRM: The patient needs a call back, she wants to speak with someone about the Medication Xarelto  2.5.

## 2024-03-22 NOTE — Telephone Encounter (Signed)
 Advised the patient that her Xarelto  is a blood thinner and I don't think it has anything to do with her heart rate being low. She gave a verbal understanding and advised me that she will talk to her cardiologist about this on Wednesday when she goes.

## 2024-03-28 ENCOUNTER — Ambulatory Visit: Attending: Cardiology | Admitting: Cardiology

## 2024-03-28 ENCOUNTER — Encounter (INDEPENDENT_AMBULATORY_CARE_PROVIDER_SITE_OTHER): Payer: Self-pay

## 2024-03-28 ENCOUNTER — Encounter: Payer: Self-pay | Admitting: Cardiology

## 2024-03-28 ENCOUNTER — Other Ambulatory Visit (HOSPITAL_COMMUNITY): Payer: Self-pay

## 2024-03-28 VITALS — BP 148/74 | HR 50 | Resp 20 | Ht <= 58 in | Wt 93.8 lb

## 2024-03-28 DIAGNOSIS — I739 Peripheral vascular disease, unspecified: Secondary | ICD-10-CM | POA: Diagnosis not present

## 2024-03-28 DIAGNOSIS — E7800 Pure hypercholesterolemia, unspecified: Secondary | ICD-10-CM

## 2024-03-28 DIAGNOSIS — I10 Essential (primary) hypertension: Secondary | ICD-10-CM

## 2024-03-28 DIAGNOSIS — R499 Unspecified voice and resonance disorder: Secondary | ICD-10-CM | POA: Diagnosis not present

## 2024-03-28 LAB — LIPID PANEL
Chol/HDL Ratio: 2.1 ratio (ref 0.0–4.4)
Cholesterol, Total: 121 mg/dL (ref 100–199)
HDL: 58 mg/dL (ref >39)
LDL Chol Calc (NIH): 44 mg/dL (ref 0–99)
Triglycerides: 102 mg/dL (ref 0–149)
VLDL Cholesterol Cal: 19 mg/dL (ref 5–40)

## 2024-03-28 MED ORDER — BLOOD PRESSURE MONITOR MISC
1.0000 [IU] | 0 refills | Status: AC
  Filled 2024-03-28: qty 1, 30d supply, fill #0

## 2024-03-28 MED ORDER — AMLODIPINE BESYLATE 5 MG PO TABS
5.0000 mg | ORAL_TABLET | Freq: Every day | ORAL | 3 refills | Status: DC
  Filled 2024-03-28: qty 90, 90d supply, fill #0

## 2024-03-28 NOTE — Progress Notes (Signed)
 Cardiology Office Note:  .   Date:  03/28/2024  ID:  Remonia Ezzard Mouton, DOB 1951/03/22, MRN [id number] PCP: Joshua Debby CROME, MD  [facility] Providers Cardiologist:  Gordy Bergamo, MD   History of Present Illness: .   Erikah Thumm is a 73 y.o. African-American female patient with hypertension, hypercholesterolemia, reactive airway disease with COPD and centrilobular emphysema with ongoing tobacco use, peripheral arterial disease with moderate to severely reduced ABI on the right in 2022 and is on aspirin  81 mg daily along with Xarelto  2.5 mg p.o. twice daily presents for follow up of dyspnea and abnormal Coronary CT angiogram revealing mild to moderate disease of 50 to 69% in the proximal LAD and coronary calcium  score of 225 in the 84th percentile.  She was recommended primary prevention with aggressive control of lipids.  And underwent echocardiogram on 01/26/2024 revealing normal LVEF, normal RV without significant valvular abnormalities.     Cardiac Studies relevent.    ECHOCARDIOGRAM COMPLETE 01/26/2024  1. Left ventricular ejection fraction, by estimation, is 60 to 65%. Left ventricular ejection fraction by 3D volume is 66 %. The left ventricle has normal function. The left ventricle has no regional wall motion abnormalities. Left ventricular diastolic parameters were normal. The average left ventricular global longitudinal strain is -20.5 %. The global longitudinal strain is normal. 2. Right ventricular systolic function is normal. The right ventricular size is normal.  ABI 01/17/2021: Right: Resting right ankle-brachial index 0.55 indicates moderate right lower extremity arterial disease.  The right toe-brachial index is abnormal.   Left: Resting left ankle-brachial index of 0.77 indicates moderate left lower extremity arterial disease.  The left toe-brachial index is abnormal.   Coronary CT angiogram  Mild to moderate disease of 50 to 69% in the proximal LAD and  coronary calcium  score of 225 in the 84th percentile. Mild disease in RCA and Cx      Discussed the use of AI scribe software for clinical note transcription with the patient, who gave verbal consent to proceed.  History of Present Illness Sharran Caratachea is a 73 year old female with hypertension and hyperlipidemia who presents for smoking cessation and cardiovascular follow-up.  She is actively reducing her cigarette consumption from a pack a day to six to seven cigarettes daily and plans not to purchase more after finishing her current pack. She previously experienced adverse reactions to nicotine  patches and is considering alternative application sites.  She has increased her physical activity, noting improvement in leg pain with walking. She takes a daily baby aspirin  and reports no issues with her medications.  She does not have a home blood pressure monitor and is unaware of her current blood pressure readings. She recalls a previous discussion about her heart rate being low.  She occasionally uses marijuana, particularly when feeling depressed, and is concerned about its impact on her blood pressure. She is taking cholesterol medication as prescribed but has not yet completed a cholesterol level test.  Labs   Lab Results  Component Value Date   CHOL 172 05/17/2023   HDL 57.40 05/17/2023   LDLCALC 92 05/17/2023   LDLDIRECT 84.0 01/25/2018   TRIG 112.0 05/17/2023   CHOLHDL 3 05/17/2023   No results found for: LIPOA  Recent Labs    04/19/23 1130 09/19/23 1116 12/27/23 1221 03/19/24 1056  NA 140 143 138 141  K 3.5 3.1* 4.7 3.5  CL 102 106 104 104  CO2 32 27  --  28  GLUCOSE  72 97 88 91  BUN 21 18 14 11   CREATININE 0.98 0.90 0.80 0.79  CALCIUM  9.5 8.9  --  9.2    Lab Results  Component Value Date   ALT 13 09/19/2023   AST 16 09/19/2023   ALKPHOS 62 09/19/2023   BILITOT 0.4 09/19/2023      Latest Ref Rng & Units 03/19/2024   10:56 AM 12/27/2023   12:21 PM  09/19/2023   11:16 AM  CBC  WBC 4.0 - 10.5 K/uL 5.9   7.3   Hemoglobin 12.0 - 15.0 g/dL 86.8  85.3  87.1   Hematocrit 36.0 - 46.0 % 40.4  43.0  39.1   Platelets 150.0 - 400.0 K/uL 189.0   247.0    Lab Results  Component Value Date   HGBA1C 4.9 07/30/2013    Lab Results  Component Value Date   TSH 0.90 03/19/2024     ROS  Review of Systems  Cardiovascular:  Positive for dyspnea on exertion (stable). Negative for chest pain and leg swelling.   Physical Exam:   VS:  BP (!) 148/74 (BP Location: Right Arm, Patient Position: Sitting, Cuff Size: Normal)   Pulse (!) 50   Resp 20   Ht 4' 10 (1.473 m)   Wt 93 lb 12.8 oz (42.5 kg)   SpO2 96%   BMI 19.60 kg/m    Wt Readings from Last 3 Encounters:  03/28/24 93 lb 12.8 oz (42.5 kg)  03/19/24 92 lb 12.8 oz (42.1 kg)  01/25/24 93 lb 3.2 oz (42.3 kg)    BP Readings from Last 3 Encounters:  03/28/24 (!) 148/74  03/19/24 (!) 150/76  01/25/24 130/69   Physical Exam Neck:     Vascular: No carotid bruit or JVD.  Cardiovascular:     Rate and Rhythm: Regular rhythm. Bradycardia present.     Pulses:          Dorsalis pedis pulses are 0 on the right side and 0 on the left side.       Posterior tibial pulses are 0 on the right side and 0 on the left side.     Heart sounds: Normal heart sounds. No murmur heard.    No gallop.  Pulmonary:     Effort: Pulmonary effort is normal.     Breath sounds: Normal breath sounds.  Abdominal:     General: Bowel sounds are normal.     Palpations: Abdomen is soft.  Musculoskeletal:     Right lower leg: No edema.     Left lower leg: No edema.    EKG:         ASSESSMENT AND PLAN: .      ICD-10-CM   1. PAD (peripheral artery disease) (HCC)  I73.9     2. Pure hypercholesterolemia  E78.00     3. Primary hypertension  I10 amLODipine  (NORVASC ) 5 MG tablet    Blood Pressure Monitor MISC    4. Pathologic change of voice  R49.9 Ambulatory referral to ENT     Assessment & Plan Nicotine   dependence (cigarettes) She is actively working on quitting smoking, having reduced her cigarette consumption from a pack a day to about six to seven cigarettes a day. She is motivated to quit and has committed to making today her last day of smoking. Discussed the health benefits of quitting smoking and the potential challenges she may face, such as cravings. - Encourage complete cessation of smoking. - Advise not to purchase any more cigarettes  after finishing the current pack. - Suggest using a nicotine  patch on her leg to avoid arm swelling and itching.  Essential hypertension Blood pressure remains elevated, consistently above 140 mmHg systolic. She does not have a home blood pressure monitor but is advised to check her blood pressure at pharmacies. A new prescription for amlodipine  5 mg daily has been provided to better control her blood pressure. Discussed that a heart rate issue was noted by another provider, but it is not a current concern. Amlodipine  is expected to be well-tolerated, with leg swelling as a potential side effect. - Prescribe amlodipine  5 mg once daily. - Advise checking blood pressure at pharmacies. - Send a prescription for a blood pressure kit to the pharmacy if covered by insurance. - Instruct to report if blood pressure exceeds 130/80 mmHg. - Discuss potential side effect of amlodipine , such as leg swelling, and advise to report if it occurs.  Hyperlipidemia She is taking her cholesterol medication as prescribed. A lipid profile was ordered previously but has not been completed yet. - Instruct to complete the lipid profile blood test.  Hoarseness of voice Hoarseness of voice has developed over the past week. Given her smoking history, there is concern for potential underlying pathology. Referral to an ENT specialist is deemed necessary to evaluate the cause of the voice change. - Refer to ENT specialist for evaluation of hoarseness.  Depressive disorder She  reports feeling depressed and has been using marijuana occasionally, which she believes may be related to her mood. Discussed that marijuana use is not significantly affecting her blood pressure.  PAD Patient symptoms of claudication have improved significantly and she is walking more.  As long as symptoms are stable, continue medical therapy.   Follow up: 6 months, if stable I will see her back on a as needed basis.  Signed,  Gordy Bergamo, MD, [facility] 03/28/2024, 10:44 AM [facility] [address] [place], [place] [address] Phone: [phone]. Fax:  [phone]

## 2024-03-28 NOTE — Patient Instructions (Signed)
 Medication Instructions:  Your physician has recommended you make the following change in your medication: Start amlodipine  5 mg by mouth daily   *If you need a refill on your cardiac medications before your next appointment, please call your pharmacy*  Lab Work: Have lab work (lipid profile) drawn in the lab on the first floor today If you have labs (blood work) drawn today and your tests are completely normal, you will receive your results only by: MyChart Message (if you have MyChart) OR A paper copy in the mail If you have any lab test that is abnormal or we need to change your treatment, we will call you to review the results.  Testing/Procedures: none  Follow-Up: At [facility], you and your health needs are our priority.  As part of our continuing mission to provide you with exceptional heart care, our providers are all part of one team.  This team includes your primary Cardiologist (physician) and Advanced Practice Providers or APPs (Physician Assistants and Nurse Practitioners) who all work together to provide you with the care you need, when you need it.  Your next appointment:   6 month(s)  Provider:   Gordy Bergamo, MD    We recommend signing up for the patient portal called MyChart.  Sign up information is provided on this After Visit Summary.  MyChart is used to connect with patients for Virtual Visits (Telemedicine).  Patients are able to view lab/test results, encounter notes, upcoming appointments, etc.  Non-urgent messages can be sent to your provider as well.   To learn more about what you can do with MyChart, go to [email or web address].   Other Instructions

## 2024-03-29 ENCOUNTER — Ambulatory Visit: Payer: Self-pay | Admitting: Cardiology

## 2024-03-29 NOTE — Progress Notes (Signed)
 Lipids excellent control

## 2024-04-03 ENCOUNTER — Encounter: Payer: Self-pay | Admitting: Acute Care

## 2024-04-16 ENCOUNTER — Ambulatory Visit (INDEPENDENT_AMBULATORY_CARE_PROVIDER_SITE_OTHER): Payer: 59

## 2024-04-16 VITALS — Ht <= 58 in | Wt 93.0 lb

## 2024-04-16 DIAGNOSIS — Z1211 Encounter for screening for malignant neoplasm of colon: Secondary | ICD-10-CM

## 2024-04-16 DIAGNOSIS — Z0000 Encounter for general adult medical examination without abnormal findings: Secondary | ICD-10-CM

## 2024-04-16 NOTE — Progress Notes (Signed)
 Subjective:   Amber Griffith is a 73 y.o. who presents for a Medicare Wellness preventive visit.  As a reminder, Annual Wellness Visits don't include a physical exam, and some assessments may be limited, especially if this visit is performed virtually. We may recommend an in-person follow-up visit with your provider if needed.  Visit Complete: Virtual I connected with  Amber Griffith on 04/16/24 by a audio enabled telemedicine application and verified that I am speaking with the correct person using two identifiers.  Patient Location: Home  Provider Location: Office/Clinic  I discussed the limitations of evaluation and management by telemedicine. The patient expressed understanding and agreed to proceed.  Vital Signs: Because this visit was a virtual/telehealth visit, some criteria may be missing or patient reported. Any vitals not documented were not able to be obtained and vitals that have been documented are patient reported.  VideoDeclined- This patient declined Librarian, academic. Therefore the visit was completed with audio only.  Persons Participating in Visit: Patient.  AWV Questionnaire: No: Patient Medicare AWV questionnaire was not completed prior to this visit.  Cardiac Risk Factors include: advanced age (>48men, >93 women);dyslipidemia;hypertension;smoking/ tobacco exposure     Objective:    Today's Vitals   04/16/24 1402  Weight: 93 lb (42.2 kg)  Height: 4' 10 (1.473 m)   Body mass index is 19.44 kg/m.     04/16/2024    2:02 PM 04/13/2023    2:35 PM 09/23/2017    2:51 PM 09/23/2017   11:23 AM  Advanced Directives  Does Patient Have a Medical Advance Directive? No No No  No   Would patient like information on creating a medical advance directive? No - Patient declined No - Patient declined  No - Patient declined      Data saved with a previous flowsheet row definition    Current Medications (verified) Outpatient  Encounter Medications as of 04/16/2024  Medication Sig   albuterol  (VENTOLIN  HFA) 108 (90 Base) MCG/ACT inhaler TAKE 2 PUFFS BY MOUTH EVERY 6 HOURS AS NEEDED FOR WHEEZE OR SHORTNESS OF BREATH   amLODipine  (NORVASC ) 5 MG tablet Take 1 tablet (5 mg total) by mouth daily.   Blood Pressure Monitor MISC Check blood pressure every morning.   [employer] D3 125 MCG (5000 UT) capsule TAKE 1 CAPSULE BY MOUTH EVERY DAY   [employer]S HEARTBURN RELIEF EX ST 254-237.5 MG/5ML SUSP TAKE 5 MLS BY MOUTH 3 (THREE) TIMES DAILY AS NEEDED.   Fluticasone -Umeclidin-Vilant (TRELEGY ELLIPTA ) 100-62.5-25 MCG/ACT AEPB Inhale 1 puff into the lungs daily.   mirtazapine  (REMERON ) 7.5 MG tablet Take 1 tablet (7.5 mg total) by mouth at bedtime.   nebivolol  (BYSTOLIC ) 5 MG tablet Take 1 tablet (5 mg total) by mouth daily.   omeprazole  (PRILOSEC) 40 MG capsule Take 1 capsule (40 mg total) by mouth 2 (two) times daily before a meal. (Patient taking differently: Take 40 mg by mouth daily.)   rivaroxaban  (XARELTO ) 2.5 MG TABS tablet TAKE 1 TABLET BY MOUTH TWICE A DAY   rosuvastatin  (CRESTOR ) 20 MG tablet Take 1 tablet (20 mg total) by mouth daily.   spironolactone  (ALDACTONE ) 25 MG tablet Take 1 tablet (25 mg total) by mouth daily.   aspirin  EC 81 MG tablet Take 1 tablet (81 mg total) by mouth daily. SWALLOW WHOLE.   No facility-administered encounter medications on file as of 04/16/2024.    Allergies (verified) Patient has no known allergies.   History: Past Medical History:  Diagnosis Date  ALLERGIC RHINITIS 09/01/2007   Anxiety and depression 12/24/2010   Cardiomegaly 01/17/2009   CONSTIPATION, CHRONIC 12/18/2008   COPD 04/04/2007   ELECTROCARDIOGRAM, ABNORMAL 01/17/2009   GERD 04/04/2007   HEADACHE, TENSION 04/18/2008   HEMORRHOIDS 09/25/2007   HYPERTENSION 06/12/2007   SINUSITIS- ACUTE-NOS 05/08/2010   TOBACCO ABUSE 04/04/2007   UTI 09/30/2010   Vaginitis and vulvovaginitis, unspecified 09/30/2010   Past Surgical History:  Procedure  Laterality Date   AMPUTATION Right 09/23/2017   Procedure: REVISION AMPUTATION RIGHT INDEX FINGER;  Surgeon: Murrell Drivers, MD;  Location: [facility];  Service: Orthopedics;  Laterality: Right;   BREAST BIOPSY Left    TONSILLECTOMY     TUBAL LIGATION     UMBILICAL HERNIA REPAIR     Family History  Problem Relation Age of Onset   Asthma Mother    Hypertension Mother    Stroke Father    Hypertension Father    Seizures Father    COPD Maternal Uncle    Stroke Paternal Uncle    Colon cancer Neg Hx    Esophageal cancer Neg Hx    Stomach cancer Neg Hx    Rectal cancer Neg Hx    Breast cancer Neg Hx    Social History   Socioeconomic History   Marital status: Divorced    Spouse name: Not on file   Number of children: 1   Years of education: 12   Highest education level: High school graduate  Occupational History   Occupation: custodial    Employer: [employer]  Tobacco Use   Smoking status: Every Day    Current packs/day: 1.00    Average packs/day: 1 pack/day for 35.0 years (35.0 ttl pk-yrs)    Types: Cigarettes    Passive exposure: Current   Smokeless tobacco: Never  Vaping Use   Vaping status: Never Used  Substance and Sexual Activity   Alcohol use: Yes    Alcohol/week: 21.0 standard drinks of alcohol    Types: 21 Cans of beer per week    Comment: 4 beer per day   Drug use: No   Sexual activity: Not Currently    Partners: Male  Other Topics Concern   Not on file  Social History Narrative   No regular exercise.   Social Drivers of Corporate investment banker Strain: Low Risk  (04/16/2024)   Overall Financial Resource Strain (CARDIA)    Difficulty of Paying Living Expenses: Not hard at all  Food Insecurity: No Food Insecurity (04/16/2024)   Hunger Vital Sign    Worried About Running Out of Food in the Last Year: Never true    Ran Out of Food in the Last Year: Never true  Transportation Needs: No Transportation Needs (04/16/2024)   PRAPARE -  Administrator, Civil Service (Medical): No    Lack of Transportation (Non-Medical): No  Physical Activity: Inactive (04/16/2024)   Exercise Vital Sign    Days of Exercise per Week: 0 days    Minutes of Exercise per Session: 0 min  Stress: No Stress Concern Present (04/16/2024)   [employer] of Occupational Health - Occupational Stress Questionnaire    Feeling of Stress: Not at all  Social Connections: Moderately Isolated (04/16/2024)   Social Connection and Isolation Panel    Frequency of Communication with Friends and Family: More than three times a week    Frequency of Social Gatherings with Friends and Family: More than three times a week  Attends Religious Services: Never    Active Member of Clubs or Organizations: Yes    Attends Engineer, structural: More than 4 times per year    Marital Status: Never married    Tobacco Counseling Ready to quit: Yes Counseling given: No    Clinical Intake:  Pre-visit preparation completed: Yes  Pain : No/denies pain     BMI - recorded: 19.44 Nutritional Status: BMI <19  Underweight Nutritional Risks: None Diabetes: No  Lab Results  Component Value Date   HGBA1C 4.9 07/30/2013     How often do you need to have someone help you when you read instructions, pamphlets, or other written materials from your doctor or pharmacy?: 1 - Never  Interpreter Needed?: No  Information entered by :: Verdie Saba, CMA   Activities of Daily Living     04/16/2024    2:05 PM  In your present state of health, do you have any difficulty performing the following activities:  Hearing? 0  Vision? 0  Difficulty concentrating or making decisions? 0  Walking or climbing stairs? 0  Dressing or bathing? 0  Doing errands, shopping? 0  Preparing Food and eating ? N  Using the Toilet? N  In the past six months, have you accidently leaked urine? N  Do you have problems with loss of bowel control? N  Managing your  Medications? N  Managing your Finances? N  Housekeeping or managing your Housekeeping? N    Patient Care Team: Joshua Debby CROME, MD as PCP - General Ladona Heinz, MD as PCP - Cardiology (Cardiology) Merceda Lela SAUNDERS, [facility] (Pharmacist)  I have updated your Care Teams any recent Medical Services you may have received from other providers in the past year.     Assessment:   This is a routine wellness examination for Amber Griffith.  Hearing/Vision screen Hearing Screening - Comments:: Denies hearing difficulties   Vision Screening - Comments:: Wears eyeglasses for reading only - plans to get an appt w/an Ophthalmologist    Goals Addressed               This Visit's Progress     Patient Stated (pt-stated)        Patient stated she's been walking more and taking meds daily       Depression Screen     04/16/2024    2:07 PM 03/19/2024   10:18 AM 04/13/2023    2:37 PM 09/13/2022    9:11 AM 08/30/2022    3:28 PM 09/18/2021   10:38 AM 01/21/2021    1:25 PM  PHQ 2/9 Scores  PHQ - 2 Score 0 1 0 1 0 0 0  PHQ- 9 Score 0 2 0        Fall Risk     04/16/2024    2:06 PM 03/19/2024   10:18 AM 01/17/2024    3:36 PM 04/13/2023    2:35 PM 08/30/2022    3:28 PM  Fall Risk   Falls in the past year? 0 0 0 0 0  Number falls in past yr: 0 0 0 0 0  Injury with Fall? 0 0 0 0 0  Risk for fall due to : No Fall Risks No Fall Risks No Fall Risks No Fall Risks No Fall Risks  Follow up Falls evaluation completed;Falls prevention discussed Falls evaluation completed Falls evaluation completed Falls prevention discussed Falls evaluation completed    MEDICARE RISK AT HOME:  Medicare Risk at Home Any stairs in or around  the home?: Yes (outside) If so, are there any without handrails?: No Home free of loose throw rugs in walkways, pet beds, electrical cords, etc?: Yes Adequate lighting in your home to reduce risk of falls?: Yes Life alert?: No Use of a cane, walker or w/c?: No Grab bars in the bathroom?:  No Shower chair or bench in shower?: No Elevated toilet seat or a handicapped toilet?: No  TIMED UP AND GO:  Was the test performed?  No  Cognitive Function: 6CIT completed        04/16/2024    2:09 PM 04/13/2023    2:36 PM  6CIT Screen  What Year? 0 points 0 points  What month? 0 points 0 points  What time? 0 points 0 points  Count back from 20 0 points 0 points  Months in reverse 0 points 0 points  Repeat phrase 0 points 0 points  Total Score 0 points 0 points    Immunizations Immunization History  Administered Date(s) Administered   Fluad Quad(high Dose 65+) 06/08/2019, 06/16/2020, 04/13/2021, 05/11/2022   Fluad Trivalent(High Dose 65+) 04/19/2023   INFLUENZA, HIGH DOSE SEASONAL PF 07/07/2018   Influenza Split 06/04/2011, 06/09/2019   Influenza Whole 05/22/2007, 05/08/2010   Influenza,inj,Quad PF,6+ Mos 05/02/2013, 05/02/2014, 07/04/2015, 08/30/2016   PFIZER(Purple Top)SARS-COV-2 Vaccination 10/15/2019, 11/06/2019, 08/01/2020   Pfizer Covid-19 Vaccine Bivalent Booster 22yrs & up 05/30/2021   Pfizer(Comirnaty)Fall Seasonal Vaccine 12 years and older 06/24/2023   Pneumococcal Conjugate-13 01/25/2018   Pneumococcal Polysaccharide-23 08/20/2005, 06/16/2020   Tdap 02/01/2011, 06/24/2023    Screening Tests Health Maintenance  Topic Date Due   Zoster Vaccines- Shingrix  (1 of 2) Never done   Colonoscopy  06/16/2022   Influenza Vaccine  03/02/2024   COVID-19 Vaccine (6 - 2024-25 season) 04/02/2024   Mammogram  08/17/2024   Lung Cancer Screening  12/26/2024   Medicare Annual Wellness (AWV)  04/16/2025   Pneumococcal Vaccine: 50+ Years  Completed   DEXA SCAN  Completed   Hepatitis C Screening  Completed   HPV VACCINES  Aged Out   Meningococcal B Vaccine  Aged Out   DTaP/Tdap/Td  Discontinued    Health Maintenance Items Addressed: Referral sent to GI for colonoscopy  Additional Screening:  Vision Screening: Recommended annual ophthalmology exams for early  detection of glaucoma and other disorders of the eye. Is the patient up to date with their annual eye exam?  No  Who is the provider or what is the name of the office in which the patient attends annual eye exams? Patient plans to schedule an appt w/an Ophthalmologist for an eye exam.  Dental Screening: Recommended annual dental exams for proper oral hygiene  Community Resource Referral / Chronic Care Management: CRR required this visit?  No   CCM required this visit?  No   Plan:    I have personally reviewed and noted the following in the patient's chart:   Medical and social history Use of alcohol, tobacco or illicit drugs  Current medications and supplements including opioid prescriptions. Patient is not currently taking opioid prescriptions. Functional ability and status Nutritional status Physical activity Advanced directives List of other physicians Hospitalizations, surgeries, and ER visits in previous 12 months Vitals Screenings to include cognitive, depression, and falls Referrals and appointments  In addition, I have reviewed and discussed with patient certain preventive protocols, quality metrics, and best practice recommendations. A written personalized care plan for preventive services as well as general preventive health recommendations were provided to patient.   Alano Blasco M  Satia Winger, CMA   04/16/2024   After Visit Summary: (MyChart) Due to this being a telephonic visit, the after visit summary with patients personalized plan was offered to patient via MyChart   Notes: Nothing significant to report at this time.

## 2024-04-16 NOTE — Patient Instructions (Addendum)
 [name],  Thank you for taking the time for your Medicare Wellness Visit. I appreciate your continued commitment to your health goals. Please review the care plan we discussed, and feel free to reach out if I can assist you further.  Medicare recommends these wellness visits once per year to help you and your care team stay ahead of potential health issues. These visits are designed to focus on prevention, allowing your provider to concentrate on managing your acute and chronic conditions during your regular appointments.  Please note that Annual Wellness Visits do not include a physical exam. Some assessments may be limited, especially if the visit was conducted virtually. If needed, we may recommend a separate in-person follow-up with your provider.  Ongoing Care Seeing your primary care provider every 3 to 6 months helps us  monitor your health and provide consistent, personalized care.   Referrals If a referral was made during today's visit and you haven't received any updates within two weeks, please contact the referred provider directly to check on the status.  Recommended Screenings:  Health Maintenance  Topic Date Due   Zoster (Shingles) Vaccine (1 of 2) Never done   Colon Cancer Screening  06/16/2022   Flu Shot  03/02/2024   COVID-19 Vaccine (6 - 2024-25 season) 04/02/2024   Breast Cancer Screening  08/17/2024   Screening for Lung Cancer  12/26/2024   Medicare Annual Wellness Visit  04/16/2025   Pneumococcal Vaccine for age over 52  Completed   DEXA scan (bone density measurement)  Completed   Hepatitis C Screening  Completed   HPV Vaccine  Aged Out   Meningitis B Vaccine  Aged Out   DTaP/Tdap/Td vaccine  Discontinued       04/16/2024    2:02 PM  Advanced Directives  Does Patient Have a Medical Advance Directive? No  Would patient like information on creating a medical advance directive? No - Patient declined   Advance Care Planning is important because it: Ensures  you receive medical care that aligns with your values, goals, and preferences. Provides guidance to your family and loved ones, reducing the emotional burden of decision-making during critical moments.  Vision: Annual vision screenings are recommended for early detection of glaucoma, cataracts, and diabetic retinopathy. These exams can also reveal signs of chronic conditions such as diabetes and high blood pressure.  Dental: Annual dental screenings help detect early signs of oral cancer, gum disease, and other conditions linked to overall health, including heart disease and diabetes.

## 2024-04-23 ENCOUNTER — Institutional Professional Consult (permissible substitution) (INDEPENDENT_AMBULATORY_CARE_PROVIDER_SITE_OTHER)

## 2024-05-07 ENCOUNTER — Other Ambulatory Visit: Payer: Self-pay | Admitting: Internal Medicine

## 2024-05-07 DIAGNOSIS — I10 Essential (primary) hypertension: Secondary | ICD-10-CM

## 2024-05-16 ENCOUNTER — Ambulatory Visit (INDEPENDENT_AMBULATORY_CARE_PROVIDER_SITE_OTHER)

## 2024-05-16 ENCOUNTER — Encounter (INDEPENDENT_AMBULATORY_CARE_PROVIDER_SITE_OTHER): Payer: Self-pay

## 2024-05-16 VITALS — BP 148/66 | HR 62 | Ht <= 58 in | Wt 90.0 lb

## 2024-05-16 DIAGNOSIS — F17210 Nicotine dependence, cigarettes, uncomplicated: Secondary | ICD-10-CM

## 2024-05-16 DIAGNOSIS — R490 Dysphonia: Secondary | ICD-10-CM

## 2024-05-16 DIAGNOSIS — J381 Polyp of vocal cord and larynx: Secondary | ICD-10-CM

## 2024-05-16 DIAGNOSIS — F17200 Nicotine dependence, unspecified, uncomplicated: Secondary | ICD-10-CM

## 2024-05-16 DIAGNOSIS — F1010 Alcohol abuse, uncomplicated: Secondary | ICD-10-CM

## 2024-05-16 NOTE — Progress Notes (Signed)
 Dear Dr. Ladona, Here is my assessment for our mutual patient, Amber Griffith. Thank you for allowing me the opportunity to care for your patient. Please do not hesitate to contact me should you have any other questions. Sincerely, Dr. Penne Croak  Otolaryngology Clinic Note Referring provider: Dr. Ladona HPI:  Discussed the use of AI scribe software for clinical note transcription with the patient, who gave verbal consent to proceed.  History of Present Illness Amber Griffith is a 73 year old female who presents with hoarseness and concerns about throat cancer.  Hoarseness and voice changes - Hoarseness onset following dental surgery on a tooth last week - Voice has progressively deepened over the past few years - Hoarseness most noticeable upon waking and during work-related speech - No hemoptysis - No dysphagia, odynophagia, dyspnea, or otalgia - No unintentional weight loss  Tobacco use - Smokes approximately one pack every two to three days - History of nicotine  patch use complicated by arm swelling and pruritus - Plans to quit smoking through prayer  Alcohol use - Consumes two beers daily, reduced from previous higher intake  Independent Review of Additional Tests or Records:  Reviewed external note from referring PCP, Ganji,describing RElevant history incorporated into today's evaluation.   PMH/Meds/All/SocHx/FamHx/ROS:   Past Medical History:  Diagnosis Date   ALLERGIC RHINITIS 09/01/2007   Anxiety and depression 12/24/2010   Cardiomegaly 01/17/2009   CONSTIPATION, CHRONIC 12/18/2008   COPD 04/04/2007   ELECTROCARDIOGRAM, ABNORMAL 01/17/2009   GERD 04/04/2007   HEADACHE, TENSION 04/18/2008   HEMORRHOIDS 09/25/2007   HYPERTENSION 06/12/2007   SINUSITIS- ACUTE-NOS 05/08/2010   TOBACCO ABUSE 04/04/2007   UTI 09/30/2010   Vaginitis and vulvovaginitis, unspecified 09/30/2010     Past Surgical History:  Procedure Laterality Date   AMPUTATION Right 09/23/2017    Procedure: REVISION AMPUTATION RIGHT INDEX FINGER;  Surgeon: Murrell Drivers, MD;  Location: [facility];  Service: Orthopedics;  Laterality: Right;   BREAST BIOPSY Left    TONSILLECTOMY     TUBAL LIGATION     UMBILICAL HERNIA REPAIR      Family History  Problem Relation Age of Onset   Asthma Mother    Hypertension Mother    Stroke Father    Hypertension Father    Seizures Father    COPD Maternal Uncle    Stroke Paternal Uncle    Colon cancer Neg Hx    Esophageal cancer Neg Hx    Stomach cancer Neg Hx    Rectal cancer Neg Hx    Breast cancer Neg Hx      Social Connections: Moderately Isolated (04/16/2024)   Social Connection and Isolation Panel    Frequency of Communication with Friends and Family: More than three times a week    Frequency of Social Gatherings with Friends and Family: More than three times a week    Attends Religious Services: Never    Database administrator or Organizations: Yes    Attends Engineer, structural: More than 4 times per year    Marital Status: Never married      Current Outpatient Medications:    albuterol  (VENTOLIN  HFA) 108 (90 Base) MCG/ACT inhaler, TAKE 2 PUFFS BY MOUTH EVERY 6 HOURS AS NEEDED FOR WHEEZE OR SHORTNESS OF BREATH, Disp: 6.7 each, Rfl: 4   amLODipine  (NORVASC ) 5 MG tablet, Take 1 tablet (5 mg total) by mouth daily., Disp: 90 tablet, Rfl: 3   aspirin  EC 81 MG tablet, Take 1 tablet (81 mg  total) by mouth daily. SWALLOW WHOLE., Disp: 90 tablet, Rfl: 1   Blood Pressure Monitor MISC, Check blood pressure every morning., Disp: 1 each, Rfl: 0   [employer] D3 125 MCG (5000 UT) capsule, TAKE 1 CAPSULE BY MOUTH EVERY DAY, Disp: 90 capsule, Rfl: 1   CVS HEARTBURN RELIEF EX ST 254-237.5 MG/5ML SUSP, TAKE 5 MLS BY MOUTH 3 (THREE) TIMES DAILY AS NEEDED., Disp: 355 mL, Rfl: 0   Fluticasone -Umeclidin-Vilant (TRELEGY ELLIPTA ) 100-62.5-25 MCG/ACT AEPB, Inhale 1 puff into the lungs daily., Disp: 120 each, Rfl: 0   mirtazapine  (REMERON )  7.5 MG tablet, Take 1 tablet (7.5 mg total) by mouth at bedtime., Disp: 90 tablet, Rfl: 0   nebivolol  (BYSTOLIC ) 5 MG tablet, Take 1 tablet (5 mg total) by mouth daily., Disp: 90 tablet, Rfl: 0   omeprazole  (PRILOSEC) 40 MG capsule, Take 1 capsule (40 mg total) by mouth 2 (two) times daily before a meal. (Patient taking differently: Take 40 mg by mouth daily.), Disp: 180 capsule, Rfl: 3   rivaroxaban  (XARELTO ) 2.5 MG TABS tablet, TAKE 1 TABLET BY MOUTH TWICE A DAY, Disp: 180 tablet, Rfl: 1   rosuvastatin  (CRESTOR ) 20 MG tablet, Take 1 tablet (20 mg total) by mouth daily., Disp: 90 tablet, Rfl: 1   spironolactone  (ALDACTONE ) 25 MG tablet, Take 1 tablet (25 mg total) by mouth daily., Disp: 90 tablet, Rfl: 0   Physical Exam:   BP (!) 148/66   Pulse 62   Ht 4' 10 (1.473 m)   Wt 90 lb (40.8 kg)   SpO2 93%   BMI 18.81 kg/m   The patient was awake, alert, and appropriate. The external ears were inspected, and otoscopy was performed to evaluate the external auditory canals and tympanic membranes. The nasal cavity and septum were examined for mucosal changes, obstruction, or discharge. The oral cavity and oropharynx were inspected for mucosal lesions, infection, or tonsillar hypertrophy. The neck was palpated for lymphadenopathy, thyroid  abnormalities, or other masses. Cranial nerve function was grossly intact.  Pertinent Findings: Physical Exam HEENT: Ears normal with minimal cerumen in the right ear, nasal passages slightly congested, oral cavity normal, throat normal. Fair dention. No masses. NECK: Neck supple, non-tender. No lymphadenopathy   Seprately Identifiable Procedures:  I personally ordered, reviewed and interpreted the following with the patient today  Procedure Note Pre-procedure diagnosis:  Dysphonia  Post-procedure diagnosis: Same Procedure: Transnasal Fiberoptic Laryngoscopy, CPT 31575 - Mod 25 Indication: dysphonia in chronic tobacco and ETOH user. Complications: None  apparent EBL: 0 mL  The procedure was undertaken to further evaluate the patient's complaint of dysphonia, with mirror exam inadequate for appropriate examination due to gag reflex and poor patient tolerance  Procedure:  Patient was identified as correct patient. Verbal consent was obtained. The nose was sprayed with oxymetazoline and 4% lidocaine . The The flexible laryngoscope was passed through the nose to view the nasal cavity, pharynx (oropharynx, hypopharynx) and larynx.  The larynx was examined at rest and during multiple phonatory tasks. Documentation was obtained and reviewed with patient. The scope was removed. The patient tolerated the procedure well.  Findings: The nasal cavity and nasopharynx did not reveal any masses or lesions, mucosa appeared to be without obvious lesions. The tongue base, pharyngeal walls, piriform sinuses, vallecula, epiglottis and postcricoid region are normal in appearance EXCEPT: reinkes edema. The visualized portion of the subglottis and proximal trachea is widely patent. The vocal folds are mobile bilaterally. There are no lesions on the free edge of the vocal folds nor  elsewhere in the larynx worrisome for malignancy.    Electronically signed by: Penne Croak, DO 05/16/2024 11:10 PM   Impression & Plans:  Amber Griffith is a 73 y.o. female  1. Dysphonia   2. Reinke's edema of vocal folds   3. TOBACCO ABUSE   4. Alcohol abuse     - Findings and diagnoses discussed in detail with the patient. - Risks, benefits, and alternatives were reviewed. Through shared decision making, the patient elects to proceed with below. Assessment & Plan Hoarseness of voice Chronic hoarseness likely due to laryngeal edema from smoking. No cancer evidence on examination. - Refer to speech therapy for voice exercises.  Laryngeal edema attributed to long-term tobacco use. Smoking cessation crucial for reducing edema and improving vocal quality. - Advise smoking  cessation. - Discuss risks of continued smoking and benefits of cessation.  Nicotine  dependence with smoking approximately one pack every two to three days. Previous nicotine  patch use caused adverse skin reactions. She plans to quit smoking through prayer and self-motivation. - Discuss alternative smoking cessation aids such as Chantix. - Encourage smoking cessation through self-motivation and prayer.  Alcohol use disorder, mild Mild alcohol use disorder with daily consumption of two beers. Advised to reduce and eventually cease alcohol intake to lower cancer risk. - Advise reduction and cessation of alcohol consumption.  High risk for head and neck cancer due to tobacco and alcohol use Increased risk for head and neck cancer due to age, smoking, and alcohol use. No current signs of cancer, but continued monitoring necessary. - Schedule follow-up in 2-3 months after speech therapy.  - Given the patient's tobacco use, I also discussed cessation and options for cessation, including counseling. Counseled patient on the dangers of tobacco use, advised patient to stop smoking, and reviewed strategies to maximize success. We reviewed treatment options to assist him quit smoking including NRT, Chantix, and Bupropion. Patient is not ready to quit, and declined further treatment. Total time spent with this was 4 minutes.   CPT 99406 (4-10 mins)  - Orders placed:  Orders Placed This Encounter  Procedures   Ambulatory referral to Speech Therapy   - Medications prescribed/continued/adjusted: No orders of the defined types were placed in this encounter.  - Education materials provided to the patient. - Follow up: 2-3 months. Patient instructed to return sooner or go to the ED if new/worsening symptoms develop.   Thank you for allowing me the opportunity to care for your patient. Please do not hesitate to contact me should you have any other questions.  Sincerely, Penne Croak,  DO Otolaryngologist (ENT) [facility] Phone: [phone] Fax: [phone]  05/16/2024, 12:54 PM

## 2024-05-17 ENCOUNTER — Other Ambulatory Visit (INDEPENDENT_AMBULATORY_CARE_PROVIDER_SITE_OTHER): Payer: Self-pay

## 2024-05-17 DIAGNOSIS — R490 Dysphonia: Secondary | ICD-10-CM

## 2024-05-17 DIAGNOSIS — J381 Polyp of vocal cord and larynx: Secondary | ICD-10-CM

## 2024-05-17 NOTE — Progress Notes (Signed)
 Placed new SLP order

## 2024-05-27 ENCOUNTER — Other Ambulatory Visit: Payer: Self-pay | Admitting: Internal Medicine

## 2024-05-27 DIAGNOSIS — J4489 Other specified chronic obstructive pulmonary disease: Secondary | ICD-10-CM

## 2024-06-09 ENCOUNTER — Other Ambulatory Visit: Payer: Self-pay | Admitting: Internal Medicine

## 2024-06-09 DIAGNOSIS — F320 Major depressive disorder, single episode, mild: Secondary | ICD-10-CM

## 2024-06-19 ENCOUNTER — Encounter: Payer: Self-pay | Admitting: Internal Medicine

## 2024-06-19 ENCOUNTER — Ambulatory Visit: Admitting: Internal Medicine

## 2024-06-19 VITALS — BP 134/68 | HR 60 | Temp 98.3°F | Resp 16 | Ht <= 58 in | Wt 97.4 lb

## 2024-06-19 DIAGNOSIS — I10 Essential (primary) hypertension: Secondary | ICD-10-CM | POA: Diagnosis not present

## 2024-06-19 DIAGNOSIS — J4489 Other specified chronic obstructive pulmonary disease: Secondary | ICD-10-CM | POA: Diagnosis not present

## 2024-06-19 MED ORDER — COVID-19 MRNA VAC-TRIS(PFIZER) 30 MCG/0.3ML IM SUSY: 0.3000 mL | PREFILLED_SYRINGE | Freq: Once | INTRAMUSCULAR | 0 refills | Status: AC

## 2024-06-19 NOTE — Progress Notes (Signed)
 Subjective:  Patient ID: Amber Griffith, female    DOB: 03-04-1951  Age: 73 y.o. MRN: [id number]  CC: Hypertension and COPD   HPI Amber Griffith presents for f/up --  Discussed the use of AI scribe software for clinical note transcription with the patient, who gave verbal consent to proceed.  History of Present Illness Amber Griffith is a 73 year old female who presents for follow-up after an ENT evaluation and to discuss weight gain.  She recently underwent an ENT evaluation with a scope examination of her throat, and she reports that the ENT told her nothing was wrong. She experiences wheezing, which she attributes to allergies, and finds relief with her inhaler. No nasal or lung symptoms are present.  She has gained weight, currently at 97.4 pounds, up from 90 pounds at her last visit. She attributes part of this gain to wearing clothes with safety pins, which have since been removed. She feels bloated. She aims to return to a weight under 95 pounds, which she maintained before gaining 20 pounds during pregnancy.  She reflects on her son's passing at age 98 this year, which may be contributing to her current concerns.  She is on blood thinners and is in the process of scheduling a colonoscopy, pending clearance due to her medication. She has a blood pressure monitor but is unsure how to interpret the readings.  No chest pain, shortness of breath, dizziness, lightheadedness, or leg swelling. Reports wheezing.     Outpatient Medications Prior to Visit  Medication Sig Dispense Refill   albuterol  (VENTOLIN  HFA) 108 (90 Base) MCG/ACT inhaler TAKE 2 PUFFS BY MOUTH EVERY 6 HOURS AS NEEDED FOR WHEEZE OR SHORTNESS OF BREATH 6.7 each 4   amLODipine  (NORVASC ) 5 MG tablet Take 1 tablet (5 mg total) by mouth daily. 90 tablet 3   aspirin  EC 81 MG tablet Take 1 tablet (81 mg total) by mouth daily. SWALLOW WHOLE. 90 tablet 1   Blood Pressure Monitor MISC Check blood pressure  every morning. 1 each 0   CVS D3 125 MCG (5000 UT) capsule TAKE 1 CAPSULE BY MOUTH EVERY DAY 90 capsule 1   CVS HEARTBURN RELIEF EX ST 254-237.5 MG/5ML SUSP TAKE 5 MLS BY MOUTH 3 (THREE) TIMES DAILY AS NEEDED. 355 mL 0   mirtazapine  (REMERON ) 7.5 MG tablet TAKE 1 TABLET BY MOUTH AT BEDTIME. 90 tablet 0   nebivolol  (BYSTOLIC ) 5 MG tablet Take 1 tablet (5 mg total) by mouth daily. 90 tablet 0   omeprazole  (PRILOSEC) 40 MG capsule Take 1 capsule (40 mg total) by mouth 2 (two) times daily before a meal. (Patient taking differently: Take 40 mg by mouth daily.) 180 capsule 3   rivaroxaban  (XARELTO ) 2.5 MG TABS tablet TAKE 1 TABLET BY MOUTH TWICE A DAY 180 tablet 1   rosuvastatin  (CRESTOR ) 20 MG tablet Take 1 tablet (20 mg total) by mouth daily. 90 tablet 1   spironolactone  (ALDACTONE ) 25 MG tablet Take 1 tablet (25 mg total) by mouth daily. 90 tablet 0   TRELEGY ELLIPTA  100-62.5-25 MCG/ACT AEPB TAKE 1 PUFF BY MOUTH EVERY DAY 120 each 1   No facility-administered medications prior to visit.    ROS Review of Systems  Constitutional:  Negative for appetite change, chills, diaphoresis, fatigue and fever.  HENT: Negative.  Negative for sore throat and trouble swallowing.   Eyes: Negative.   Respiratory: Negative.  Negative for cough, chest tightness, shortness of breath and wheezing.   Cardiovascular:  Negative for chest pain, palpitations and leg swelling.  Gastrointestinal: Negative.  Negative for abdominal pain, constipation, diarrhea, nausea and vomiting.  Endocrine: Negative.   Genitourinary: Negative.  Negative for difficulty urinating.  Musculoskeletal: Negative.  Negative for arthralgias and myalgias.  Skin: Negative.   Neurological:  Negative for dizziness, weakness and light-headedness.  Hematological:  Negative for adenopathy. Does not bruise/bleed easily.  Psychiatric/Behavioral: Negative.      Objective:  BP 134/68 (BP Location: Left Arm, Patient Position: Sitting)   Pulse 60    Temp 98.3 F (36.8 C) (Temporal)   Resp 16   Ht 4' 10 (1.473 m)   Wt 97 lb 6.4 oz (44.2 kg)   SpO2 94%   BMI 20.36 kg/m   BP Readings from Last 3 Encounters:  06/19/24 134/68  05/16/24 (!) 148/66  03/28/24 (!) 148/74    Wt Readings from Last 3 Encounters:  06/19/24 97 lb 6.4 oz (44.2 kg)  05/16/24 90 lb (40.8 kg)  04/16/24 93 lb (42.2 kg)    Physical Exam Vitals reviewed.  Constitutional:      General: She is not in acute distress.    Appearance: She is not ill-appearing or toxic-appearing.  HENT:     Nose: Nose normal.     Mouth/Throat:     Mouth: Mucous membranes are moist.  Eyes:     General: No scleral icterus.    Conjunctiva/sclera: Conjunctivae normal.  Cardiovascular:     Rate and Rhythm: Normal rate and regular rhythm.     Heart sounds: No murmur heard.    No friction rub. No gallop.  Pulmonary:     Effort: Pulmonary effort is normal.     Breath sounds: No stridor. No wheezing, rhonchi or rales.  Abdominal:     General: Abdomen is flat.     Palpations: There is no mass.     Tenderness: There is no abdominal tenderness. There is no guarding.     Hernia: No hernia is present.  Musculoskeletal:        General: Normal range of motion.     Cervical back: Neck supple.     Right lower leg: No edema.     Left lower leg: No edema.  Lymphadenopathy:     Cervical: No cervical adenopathy.  Skin:    General: Skin is warm and dry.  Neurological:     General: No focal deficit present.     Mental Status: She is alert.  Psychiatric:        Mood and Affect: Mood normal.        Behavior: Behavior normal.     Lab Results  Component Value Date   WBC 5.9 03/19/2024   HGB 13.1 03/19/2024   HCT 40.4 03/19/2024   PLT 189.0 03/19/2024   GLUCOSE 91 03/19/2024   CHOL 121 03/28/2024   TRIG 102 03/28/2024   HDL 58 03/28/2024   LDLDIRECT 84.0 01/25/2018   LDLCALC 44 03/28/2024   ALT 13 09/19/2023   AST 16 09/19/2023   NA 141 03/19/2024   K 3.5 03/19/2024   CL  104 03/19/2024   CREATININE 0.79 03/19/2024   BUN 11 03/19/2024   CO2 28 03/19/2024   TSH 0.90 03/19/2024   INR 1.1 12/05/2022   HGBA1C 4.9 07/30/2013    ECHOCARDIOGRAM COMPLETE Result Date: 01/26/2024    ECHOCARDIOGRAM REPORT   Patient Name:   Amber ZARLING [facility] Date of Exam: 01/26/2024 Medical Rec #:  [id number]  Height:       58.0 in Accession #:    [id number]           Weight:       93.2 lb Date of Birth:  [birth year]             BSA:          1.316 m Patient Age:    73 years             BP:           130/69 mmHg Patient Gender: F                    HR:           75 bpm. Exam Location:  [address] Procedure: 2D Echo, Cardiac Doppler, Color Doppler, 3D Echo and Strain Analysis            (Both Spectral and Color Flow Doppler were utilized during            procedure). Indications:    R94.31 Abnormal EKG  History:        Patient has no prior history of Echocardiogram examinations.                 Abnormal ECG, COPD; Risk Factors:Hypertension, Dyslipidemia and                 Current Smoker.  Sonographer:    Elsie Bohr RDCS Referring Phys: [id number] DEBBY LITTIE MOLT IMPRESSIONS  1. Left ventricular ejection fraction, by estimation, is 60 to 65%. Left ventricular ejection fraction by 3D volume is 66 %. The left ventricle has normal function. The left ventricle has no regional wall motion abnormalities. Left ventricular diastolic  parameters were normal. The average left ventricular global longitudinal strain is -20.5 %. The global longitudinal strain is normal.  2. Right ventricular systolic function is normal. The right ventricular size is normal.  3. The mitral valve is normal in structure. No evidence of mitral valve regurgitation. No evidence of mitral stenosis.  4. The aortic valve is normal in structure. Aortic valve regurgitation is not visualized. No aortic stenosis is present.  5. The inferior vena cava is normal in size with greater than 50% respiratory variability, suggesting right  atrial pressure of 3 mmHg. FINDINGS  Left Ventricle: Left ventricular ejection fraction, by estimation, is 60 to 65%. Left ventricular ejection fraction by 3D volume is 66 %. The left ventricle has normal function. The left ventricle has no regional wall motion abnormalities. The average left ventricular global longitudinal strain is -20.5 %. Strain was performed and the global longitudinal strain is normal. The left ventricular internal cavity size was normal in size. There is no left ventricular hypertrophy. Left ventricular diastolic parameters were normal. Right Ventricle: The right ventricular size is normal. No increase in right ventricular wall thickness. Right ventricular systolic function is normal. Left Atrium: Left atrial size was normal in size. Right Atrium: Right atrial size was normal in size. Pericardium: There is no evidence of pericardial effusion. Mitral Valve: The mitral valve is normal in structure. No evidence of mitral valve regurgitation. No evidence of mitral valve stenosis. Tricuspid Valve: The tricuspid valve is normal in structure. Tricuspid valve regurgitation is mild . No evidence of tricuspid stenosis. Aortic Valve: The aortic valve is normal in structure. Aortic valve regurgitation is not visualized. No aortic stenosis is present. Pulmonic Valve: The pulmonic valve was normal in structure. Pulmonic valve regurgitation is not  visualized. No evidence of pulmonic stenosis. Aorta: The aortic root is normal in size and structure. Venous: The inferior vena cava is normal in size with greater than 50% respiratory variability, suggesting right atrial pressure of 3 mmHg. IAS/Shunts: No atrial level shunt detected by color flow Doppler. Additional Comments: 3D was performed not requiring image post processing on an independent workstation and was normal.  LEFT VENTRICLE PLAX 2D LVIDd:         3.30 cm         Diastology LVIDs:         2.00 cm         LV e' medial:    5.66 cm/s LV PW:          1.00 cm         LV E/e' medial:  12.3 LV IVS:        1.30 cm         LV e' lateral:   6.53 cm/s LVOT diam:     1.80 cm         LV E/e' lateral: 10.7 LV SV:         51 LV SV Index:   39              2D Longitudinal LVOT Area:     2.54 cm        Strain                                2D Strain GLS   -19.5 %                                (A4C):                                2D Strain GLS   -19.1 %                                (A3C):                                2D Strain GLS   -23.0 %                                (A2C):                                2D Strain GLS   -20.5 %                                Avg:                                 3D Volume EF                                LV 3D EF:    Left  ventricul                                             ar                                             ejection                                             fraction                                             by 3D                                             volume is                                             66 %.                                 3D Volume EF:                                3D EF:        66 %                                LV EDV:       83 ml                                LV ESV:       28 ml                                LV SV:        55 ml RIGHT VENTRICLE             IVC RV S prime:     10.10 cm/s  IVC diam: 0.80 cm TAPSE (M-mode): 1.6 cm RVSP:           30.5 mmHg LEFT ATRIUM             Index        RIGHT ATRIUM           Index LA diam:        3.10 cm 2.35 cm/m   RA Pressure: 3.00 mmHg LA Vol (A2C):   27.6 ml 20.97 ml/m  RA Area:     11.20 cm LA Vol (A4C):   21.9 ml  16.64 ml/m  RA Volume:   28.00 ml  21.27 ml/m LA Biplane Vol: 24.4 ml 18.53 ml/m  AORTIC VALVE LVOT Vmax:   93.40 cm/s LVOT Vmean:  55.500 cm/s LVOT VTI:    0.202 m  AORTA Ao Root diam: 2.80 cm Ao Asc diam:  3.00 cm MITRAL VALVE               TRICUSPID VALVE MV Area (PHT): 2.30 cm     TR Peak grad:   27.5 mmHg MV Decel Time: 330 msec    TR Vmax:        262.00 cm/s MV E velocity: 69.70 cm/s  Estimated RAP:  3.00 mmHg MV A velocity: 95.40 cm/s  RVSP:           30.5 mmHg MV E/A ratio:  0.73                            SHUNTS                            Systemic VTI:  0.20 m                            Systemic Diam: 1.80 cm Morene Brownie Electronically signed by Morene Brownie Signature Date/Time: 01/26/2024/1:44:17 PM    Final     Assessment & Plan:   COPD (chronic obstructive pulmonary disease) with chronic bronchitis (HCC)- She is doing well on trelegy. -     COVID-19 mRNA Vac-TriS(Pfizer); Inject 0.3 mLs into the muscle once for 1 dose.  Dispense: 0.3 mL; Refill: 0  Essential hypertension- Her BP is well controlled.     Follow-up: Return in about 4 months (around 10/17/2024).  Debby Molt, MD

## 2024-07-06 ENCOUNTER — Other Ambulatory Visit: Payer: Self-pay | Admitting: Internal Medicine

## 2024-07-06 DIAGNOSIS — I10 Essential (primary) hypertension: Secondary | ICD-10-CM

## 2024-07-13 ENCOUNTER — Other Ambulatory Visit: Payer: Self-pay | Admitting: Cardiology

## 2024-07-13 DIAGNOSIS — E7800 Pure hypercholesterolemia, unspecified: Secondary | ICD-10-CM

## 2024-07-13 DIAGNOSIS — I739 Peripheral vascular disease, unspecified: Secondary | ICD-10-CM

## 2024-07-21 ENCOUNTER — Other Ambulatory Visit: Payer: Self-pay | Admitting: Internal Medicine

## 2024-07-21 DIAGNOSIS — I10 Essential (primary) hypertension: Secondary | ICD-10-CM

## 2024-08-06 ENCOUNTER — Ambulatory Visit: Admitting: Gastroenterology

## 2024-08-06 NOTE — Progress Notes (Deleted)
 "  Chief Complaint: Primary GI MD: Dr. Teressa  HPI:  *** is a  ***  who was referred to me by Joshua Debby CROME, MD for a complaint of *** .     Discussed the use of AI scribe software for clinical note transcription with the patient, who gave verbal consent to proceed.  History of Present Illness      PREVIOUS GI WORKUP   echocardiogram on 01/26/2024 revealing normal LVEF, normal RV without significant valvular abnormalities.   EGD 01/2020 for dysphagia - Mild gastritis, biopsied to check for H. pylori.  - The esophagus and GE junction were normal. Biopsies taken from distal ( jar 2) and proximal ( jar 3) esophagus to check for EoE.  - The examination was otherwise normal.  Diagnosis  1. Surgical [P], gastric antrum and gastric body - ANTRAL MUCOSA WITH HYPEREMIA AND SLIGHT CHRONIC INFLAMMATION. [place] PHLEGM NEGATIVE FOR HELICOBACTER PYLORI. - NO INTESTINAL METAPLASIA, DYSPLASIA OR CARCINOMA.   2. Surgical [P], distal esophagus - BENIGN SQUAMOUS MUCOSA. - NO EOSINOPHILIC ESOPHAGITIS (LESS THAN 5 PER HIGH POWER FIELD).   3. Surgical [P], proximal esophagus - BENIGN SQUAMOUS MUCOSA. - NO EOSINOPHILIC ESOPHAGITIS (LESS THAN 5 PER HIGH POWER FIELD).  Colonoscopy 06/2012 for rectal bleeding showed no polyps or cancers.  She did have medium to large external anal hemorrhoids which were likely causing her rectal bleeding.    Past Medical History:  Diagnosis Date   ALLERGIC RHINITIS 09/01/2007   Anxiety and depression 12/24/2010   Cardiomegaly 01/17/2009   CONSTIPATION, CHRONIC 12/18/2008   COPD 04/04/2007   ELECTROCARDIOGRAM, ABNORMAL 01/17/2009   GERD 04/04/2007   HEADACHE, TENSION 04/18/2008   HEMORRHOIDS 09/25/2007   HYPERTENSION 06/12/2007   SINUSITIS- ACUTE-NOS 05/08/2010   TOBACCO ABUSE 04/04/2007   UTI 09/30/2010   Vaginitis and vulvovaginitis, unspecified 09/30/2010    Past Surgical History:  Procedure Laterality Date   AMPUTATION Right 09/23/2017   Procedure: REVISION  AMPUTATION RIGHT INDEX FINGER;  Surgeon: Murrell Drivers, MD;  Location: [facility];  Service: Orthopedics;  Laterality: Right;   BREAST BIOPSY Left    TONSILLECTOMY     TUBAL LIGATION     UMBILICAL HERNIA REPAIR      Current Outpatient Medications  Medication Sig Dispense Refill   albuterol  (VENTOLIN  HFA) 108 (90 Base) MCG/ACT inhaler TAKE 2 PUFFS BY MOUTH EVERY 6 HOURS AS NEEDED FOR WHEEZE OR SHORTNESS OF BREATH 6.7 each 4   amLODipine  (NORVASC ) 5 MG tablet Take 1 tablet (5 mg total) by mouth daily. 90 tablet 3   aspirin  EC 81 MG tablet Take 1 tablet (81 mg total) by mouth daily. SWALLOW WHOLE. 90 tablet 1   Blood Pressure Monitor MISC Check blood pressure every morning. 1 each 0   CVS D3 125 MCG (5000 UT) capsule TAKE 1 CAPSULE BY MOUTH EVERY DAY 90 capsule 1   CVS HEARTBURN RELIEF EX ST 254-237.5 MG/5ML SUSP TAKE 5 MLS BY MOUTH 3 (THREE) TIMES DAILY AS NEEDED. 355 mL 0   mirtazapine  (REMERON ) 7.5 MG tablet TAKE 1 TABLET BY MOUTH AT BEDTIME. 90 tablet 0   nebivolol  (BYSTOLIC ) 5 MG tablet TAKE 1 TABLET (5 MG TOTAL) BY MOUTH DAILY. 90 tablet 0   omeprazole  (PRILOSEC) 40 MG capsule Take 1 capsule (40 mg total) by mouth 2 (two) times daily before a meal. (Patient taking differently: Take 40 mg by mouth daily.) 180 capsule 3   rivaroxaban  (XARELTO ) 2.5 MG TABS tablet TAKE 1 TABLET BY MOUTH TWICE  A DAY 180 tablet 1   rosuvastatin  (CRESTOR ) 20 MG tablet TAKE 1 TABLET BY MOUTH EVERY DAY 90 tablet 2   spironolactone  (ALDACTONE ) 25 MG tablet TAKE 1 TABLET (25 MG TOTAL) BY MOUTH DAILY. 90 tablet 0   TRELEGY ELLIPTA  100-62.5-25 MCG/ACT AEPB TAKE 1 PUFF BY MOUTH EVERY DAY 120 each 1   No current facility-administered medications for this visit.    Allergies as of 08/06/2024   (No Known Allergies)    Family History  Problem Relation Age of Onset   Asthma Mother    Hypertension Mother    Stroke Father    Hypertension Father    Seizures Father    COPD Maternal Uncle    Stroke  Paternal Uncle    Colon cancer Neg Hx    Esophageal cancer Neg Hx    Stomach cancer Neg Hx    Rectal cancer Neg Hx    Breast cancer Neg Hx     Social History   Socioeconomic History   Marital status: Divorced    Spouse name: Not on file   Number of children: 1   Years of education: 12   Highest education level: High school graduate  Occupational History   Occupation: custodial    Employer: [employer]  Tobacco Use   Smoking status: Every Day    Current packs/day: 1.00    Average packs/day: 1 pack/day for 35.0 years (35.0 ttl pk-yrs)    Types: Cigarettes    Passive exposure: Current   Smokeless tobacco: Never  Vaping Use   Vaping status: Never Used  Substance and Sexual Activity   Alcohol use: Yes    Alcohol/week: 21.0 standard drinks of alcohol    Types: 21 Cans of beer per week    Comment: 4 beer per day   Drug use: No   Sexual activity: Not Currently    Partners: Male  Other Topics Concern   Not on file  Social History Narrative   No regular exercise.   Social Drivers of Health   Tobacco Use: High Risk (06/19/2024)   Patient History    Smoking Tobacco Use: Every Day    Smokeless Tobacco Use: Never    Passive Exposure: Current  Financial Resource Strain: Low Risk (04/16/2024)   Overall Financial Resource Strain (CARDIA)    Difficulty of Paying Living Expenses: Not hard at all  Food Insecurity: No Food Insecurity (04/16/2024)   Epic    Worried About Programme Researcher, Broadcasting/film/video in the Last Year: Never true    Ran Out of Food in the Last Year: Never true  Transportation Needs: No Transportation Needs (04/16/2024)   Epic    Lack of Transportation (Medical): No    Lack of Transportation (Non-Medical): No  Physical Activity: Inactive (04/16/2024)   Exercise Vital Sign    Days of Exercise per Week: 0 days    Minutes of Exercise per Session: 0 min  Stress: No Stress Concern Present (04/16/2024)   [employer] of Occupational Health - Occupational Stress  Questionnaire    Feeling of Stress: Not at all  Social Connections: Moderately Isolated (04/16/2024)   Social Connection and Isolation Panel    Frequency of Communication with Friends and Family: More than three times a week    Frequency of Social Gatherings with Friends and Family: More than three times a week    Attends Religious Services: Never    Database Administrator or Organizations: Yes    Attends Banker  Meetings: More than 4 times per year    Marital Status: Never married  Intimate Partner Violence: Not At Risk (04/16/2024)   Epic    Fear of Current or Ex-Partner: No    Emotionally Abused: No    Physically Abused: No    Sexually Abused: No  Depression (PHQ2-9): Low Risk (04/16/2024)   Depression (PHQ2-9)    PHQ-2 Score: 0  Alcohol Screen: Low Risk (04/16/2024)   Alcohol Screen    Last Alcohol Screening Score (AUDIT): 4  Housing: Unknown (04/16/2024)   Epic    Unable to Pay for Housing in the Last Year: No    Number of Times Moved in the Last Year: Not on file    Homeless in the Last Year: No  Utilities: Not At Risk (04/16/2024)   Epic    Threatened with loss of utilities: No  Health Literacy: Adequate Health Literacy (04/16/2024)   B1300 Health Literacy    Frequency of need for help with medical instructions: Never    Review of Systems:    Constitutional: No weight loss, fever, chills, weakness or fatigue HEENT: Eyes: No change in vision               Ears, Nose, Throat:  No change in hearing or congestion Skin: No rash or itching Cardiovascular: No chest pain, chest pressure or palpitations   Respiratory: No SOB or cough Gastrointestinal: See HPI and otherwise negative Genitourinary: No dysuria or change in urinary frequency Neurological: No headache, dizziness or syncope Musculoskeletal: No new muscle or joint pain Hematologic: No bleeding or bruising Psychiatric: No history of depression or anxiety    Physical Exam:  Vital signs: There were no  vitals taken for this visit.  Constitutional: NAD, alert and cooperative Head:  Normocephalic and atraumatic. Eyes:   PEERL, EOMI. No icterus. Conjunctiva pink. Respiratory: Respirations even and unlabored. Lungs clear to auscultation bilaterally.   No wheezes, crackles, or rhonchi.  Cardiovascular:  Regular rate and rhythm. No peripheral edema, cyanosis or pallor.  Gastrointestinal:  Soft, nondistended, nontender. No rebound or guarding. Normal bowel sounds. No appreciable masses or hepatomegaly. Rectal:  Declines Msk:  Symmetrical without gross deformities. Without edema, no deformity or joint abnormality.  Neurologic:  Alert and  oriented x4;  grossly normal neurologically.  Skin:   Dry and intact without significant lesions or rashes. Psychiatric: Oriented to person, place and time. Demonstrates good judgement and reason without abnormal affect or behaviors.  Physical Exam    RELEVANT LABS AND IMAGING: CBC    Component Value Date/Time   WBC 5.9 03/19/2024 1056   RBC 4.45 03/19/2024 1056   HGB 13.1 03/19/2024 1056   HCT 40.4 03/19/2024 1056   PLT 189.0 03/19/2024 1056   MCV 90.8 03/19/2024 1056   MCH 29.5 12/05/2022 1557   MCHC 32.3 03/19/2024 1056   RDW 13.5 03/19/2024 1056   LYMPHSABS 2.3 03/19/2024 1056   MONOABS 0.5 03/19/2024 1056   EOSABS 0.1 03/19/2024 1056   BASOSABS 0.1 03/19/2024 1056    CMP     Component Value Date/Time   NA 141 03/19/2024 1056   K 3.5 03/19/2024 1056   CL 104 03/19/2024 1056   CO2 28 03/19/2024 1056   GLUCOSE 91 03/19/2024 1056   BUN 11 03/19/2024 1056   CREATININE 0.79 03/19/2024 1056   CREATININE 0.75 03/13/2021 1610   CALCIUM  9.2 03/19/2024 1056   PROT 7.2 09/19/2023 1116   ALBUMIN 4.5 09/19/2023 1116   AST 16 09/19/2023 1116  ALT 13 09/19/2023 1116   ALKPHOS 62 09/19/2023 1116   BILITOT 0.4 09/19/2023 1116   GFRNONAA >60 12/05/2022 1557   GFRAA  09/23/2009 1200    >60        The eGFR has been calculated using the MDRD  equation. This calculation has not been validated in all clinical situations. eGFR's persistently <60 mL/min signify possible Chronic Kidney Disease.     Assessment/Plan:    Colon cancer screening Normal colonoscopy 2013  COPD  PAD On Xarelto    Nestor Mollie RIGGERS [facility] 08/06/2024, 8:07 AM  Cc: Joshua Debby CROME, MD "

## 2024-08-13 ENCOUNTER — Telehealth (INDEPENDENT_AMBULATORY_CARE_PROVIDER_SITE_OTHER): Payer: Self-pay

## 2024-08-13 NOTE — Telephone Encounter (Signed)
 I spoke with patient today at 11:20am. I noticed she had not taken speech therapy yet, I saw called noted in referral. Dr. Anice stated there is not need for him to see patient until therapy is done. Also he wanted to know if patient would like stop smoking patches to help her stop. Patient stated she does want patches. I gave her the phone # for Speech Therapy and told her we will cancel the appointment she has with Anice on 08/16/24 and for her to call office back when she is about to finish therapy to get rescheduled to be seen in office soon after she finishes. Patient stated she understood.

## 2024-08-13 NOTE — Telephone Encounter (Signed)
 Left message for patient to call back to reschedule 08/16/2024 appointment with Dr. Anice.  Patient has not been to speech therapy yet

## 2024-08-14 ENCOUNTER — Telehealth: Payer: Self-pay

## 2024-08-14 NOTE — Telephone Encounter (Signed)
 Copied from CRM [id number]. Topic: Clinical - Medical Advice >> Aug 14, 2024 12:29 PM Thersia BROCKS wrote: Reason for CRM: Patient called in stated the ENT stated that patent will need to have speech therapy , the therapist is going on maternity leave so she will be out . Want to know what she should do   Would like for Dr.Jones

## 2024-08-15 NOTE — Telephone Encounter (Signed)
 Please advise

## 2024-08-16 ENCOUNTER — Ambulatory Visit (INDEPENDENT_AMBULATORY_CARE_PROVIDER_SITE_OTHER)

## 2024-08-16 ENCOUNTER — Other Ambulatory Visit (INDEPENDENT_AMBULATORY_CARE_PROVIDER_SITE_OTHER): Payer: Self-pay

## 2024-08-16 DIAGNOSIS — F17200 Nicotine dependence, unspecified, uncomplicated: Secondary | ICD-10-CM

## 2024-08-16 MED ORDER — NICOTINE 21 MG/24HR TD PT24: MEDICATED_PATCH | TRANSDERMAL | 0 refills | Status: DC

## 2024-08-16 MED ORDER — NICOTINE 7 MG/24HR TD PT24: MEDICATED_PATCH | TRANSDERMAL | 0 refills | Status: DC

## 2024-08-16 MED ORDER — NICOTINE 14 MG/24HR TD PT24: MEDICATED_PATCH | TRANSDERMAL | 0 refills | Status: AC

## 2024-08-17 ENCOUNTER — Ambulatory Visit: Payer: Self-pay

## 2024-08-17 NOTE — Telephone Encounter (Signed)
 FYI Only or Action Required?: FYI only for provider: appointment scheduled on 08/20/24.  Patient was last seen in primary care on 06/19/2024 by Joshua Debby CROME, MD.  Called Nurse Triage reporting Cough.  Symptoms began several days ago.  Interventions attempted: OTC medications: Acetaminophen  and Prescription medications: Hydrocodone -homatropine.  Symptoms are: unchanged.  Triage Disposition: See Physician Within 24 Hours (overriding See HCP Within 4 Hours (Or PCP Triage))  Patient/caregiver understands and will follow disposition?: Yes  Reason for Disposition  Wheezing is present  Answer Assessment - Initial Assessment Questions Patient states that she has been experiencing cough since Monday. She reports chest congestion with some difficulty coughing up sputum, but does get some white sputum up. She also reports mild wheezing. Has not needed her Albuterol  inhaler, uses Trelegy daily as prescribed. She has taken some Acetaminophen  and states she has some prescription cough syrup from when she was sick last year that she has been taking as well. Office visit advised.   1. ONSET: When did the cough begin?      Monday  2. SEVERITY: How bad is the cough today?      Moderate  3. SPUTUM: Describe the color of your sputum (e.g., none, dry cough; clear, white, yellow, green)     White  4. HEMOPTYSIS: Are you coughing up any blood? If Yes, ask: How much? (e.g., flecks, streaks, tablespoons, etc.)     No  5. DIFFICULTY BREATHING: Are you having difficulty breathing? If Yes, ask: How bad is it? (e.g., mild, moderate, severe)      Denies difficulty breathing  6. FEVER: Do you have a fever? If Yes, ask: What is your temperature, how was it measured, and when did it start?     No  7. CARDIAC HISTORY: Do you have any history of heart disease? (e.g., heart attack, congestive heart failure)      Cardiomegaly  8. LUNG HISTORY: Do you have any history of lung disease?   (e.g., pulmonary embolus, asthma, emphysema)     COPD  9. PE RISK FACTORS: Do you have a history of blood clots? (or: recent major surgery, recent prolonged travel, bedridden)     No  10. OTHER SYMPTOMS: Do you have any other symptoms? (e.g., runny nose, wheezing, chest pain)       Mild Wheezing, chest congestion  11. PREGNANCY: Is there any chance you are pregnant? When was your last menstrual period?       NA  12. TRAVEL: Have you traveled out of the country in the last month? (e.g., travel history, exposures)       Unknown  Protocols used: Cough - Acute Productive-A-AH  Reason for Triage: cough , cold ratteling in chest , patient stated she has been sick for a while and want to know what she needs to do

## 2024-08-20 ENCOUNTER — Ambulatory Visit: Admitting: Family Medicine

## 2024-08-20 ENCOUNTER — Encounter: Payer: Self-pay | Admitting: Family Medicine

## 2024-08-20 VITALS — BP 138/70 | HR 60 | Temp 97.4°F | Resp 18 | Ht <= 58 in | Wt 97.0 lb

## 2024-08-20 DIAGNOSIS — J441 Chronic obstructive pulmonary disease with (acute) exacerbation: Secondary | ICD-10-CM

## 2024-08-20 MED ORDER — AMOXICILLIN-POT CLAVULANATE 875-125 MG PO TABS: 1.0000 | ORAL_TABLET | Freq: Two times a day (BID) | ORAL | 0 refills | Status: AC

## 2024-08-20 MED ORDER — PREDNISONE 20 MG PO TABS: 40.0000 mg | ORAL_TABLET | Freq: Every day | ORAL | 0 refills | Status: AC

## 2024-08-20 NOTE — Progress Notes (Signed)
 "  Assessment & Plan COPD with exacerbation [facility]) Education provided on COPD exacerbations. Encouraged to use Albuterol  as needed for wheezing or shortness of breath. Continue cough syrup and Tylenol  as needed.  Orders:   predniSONE  (DELTASONE ) 20 MG tablet; Take 2 tablets (40 mg total) by mouth daily for 5 days.   amoxicillin -clavulanate (AUGMENTIN ) 875-125 MG tablet; Take 1 tablet by mouth 2 (two) times daily for 7 days.    Follow up plan: Return in about 8 weeks (around 10/17/2024) for chronic follow-up with PCP.  Niki Rung, MSN, APRN, FNP-C  Subjective:  HPI: Amber Griffith is a 74 y.o. female presenting on 08/20/2024 for Cough (Cough and congestion, chest rattling, thick white mucus. No fever or chills. Started over a week ago /Used some left over cough syrup from June OV ) and Medication Problem (Mirtazapine  - causing nightmares /)  Discussed the use of AI scribe software for clinical note transcription with the patient, who gave verbal consent to proceed.  She has been experiencing cough and chest congestion for the past week, with the congestion localized to her chest. She produces thick white mucus and has no fever.  She has a history of COPD and uses her Trelegy inhaler regularly. Despite experiencing wheezing, she has not used her albuterol  inhaler. She has been taking over-the-counter cough syrup and Tylenol  to manage her symptoms, but her condition has not improved over the past week.      ROS: Negative unless specifically indicated above in HPI.   Relevant past medical history reviewed and updated as indicated.   Allergies and medications reviewed and updated.  Current Medications[1]  Allergies[2]  Objective:   BP 138/70   Pulse 60   Temp (!) 97.4 F (36.3 C)   Resp 18   Ht 4' 10 (1.473 m)   Wt 97 lb (44 kg)   SpO2 98%   BMI 20.27 kg/m    Physical Exam Vitals reviewed.  Constitutional:      General: She is not in acute distress.     Appearance: Normal appearance. She is not ill-appearing, toxic-appearing or diaphoretic.  HENT:     Head: Normocephalic and atraumatic.     Right Ear: Tympanic membrane, ear canal and external ear normal. There is no impacted cerumen.     Left Ear: Tympanic membrane, ear canal and external ear normal. There is no impacted cerumen.     Nose: Nose normal. No congestion or rhinorrhea.     Right Sinus: No maxillary sinus tenderness or frontal sinus tenderness.     Left Sinus: No maxillary sinus tenderness or frontal sinus tenderness.     Mouth/Throat:     Mouth: Mucous membranes are moist.     Pharynx: Oropharynx is clear. No oropharyngeal exudate or posterior oropharyngeal erythema.  Eyes:     General: No scleral icterus.       Right eye: No discharge.        Left eye: No discharge.     Conjunctiva/sclera: Conjunctivae normal.  Cardiovascular:     Rate and Rhythm: Normal rate and regular rhythm.     Heart sounds: Normal heart sounds. No murmur heard.    No friction rub. No gallop.  Pulmonary:     Effort: Pulmonary effort is normal. No respiratory distress.     Breath sounds: No stridor. Examination of the left-upper field reveals wheezing. Examination of the left-middle field reveals wheezing. Examination of the left-lower field reveals wheezing. Wheezing present. No rhonchi or rales.  Musculoskeletal:        General: Normal range of motion.     Cervical back: Normal range of motion.  Lymphadenopathy:     Cervical: No cervical adenopathy.  Skin:    General: Skin is warm and dry.     Capillary Refill: Capillary refill takes less than 2 seconds.  Neurological:     General: No focal deficit present.     Mental Status: She is alert and oriented to person, place, and time. Mental status is at baseline.  Psychiatric:        Mood and Affect: Mood normal.        Behavior: Behavior normal.        Thought Content: Thought content normal.        Judgment: Judgment normal.             [1]  Current Outpatient Medications:    albuterol  (VENTOLIN  HFA) 108 (90 Base) MCG/ACT inhaler, TAKE 2 PUFFS BY MOUTH EVERY 6 HOURS AS NEEDED FOR WHEEZE OR SHORTNESS OF BREATH, Disp: 6.7 each, Rfl: 4   amoxicillin -clavulanate (AUGMENTIN ) 875-125 MG tablet, Take 1 tablet by mouth 2 (two) times daily for 7 days., Disp: 14 tablet, Rfl: 0   aspirin  EC 81 MG tablet, Take 1 tablet (81 mg total) by mouth daily. SWALLOW WHOLE., Disp: 90 tablet, Rfl: 1   Blood Pressure Monitor MISC, Check blood pressure every morning., Disp: 1 each, Rfl: 0   mirtazapine  (REMERON ) 7.5 MG tablet, TAKE 1 TABLET BY MOUTH AT BEDTIME., Disp: 90 tablet, Rfl: 0   nebivolol  (BYSTOLIC ) 5 MG tablet, TAKE 1 TABLET (5 MG TOTAL) BY MOUTH DAILY., Disp: 90 tablet, Rfl: 0   nicotine  (NICODERM CQ  - DOSED IN MG/24 HOURS) 14 mg/24hr patch, RX #2 Weeks 5-6: 14 mg x 1 patch daily. Wear for 24 hours. If you have sleep disturbances, remove at bedtime., Disp: 14 patch, Rfl: 0   omeprazole  (PRILOSEC) 40 MG capsule, Take 1 capsule (40 mg total) by mouth 2 (two) times daily before a meal., Disp: 180 capsule, Rfl: 3   predniSONE  (DELTASONE ) 20 MG tablet, Take 2 tablets (40 mg total) by mouth daily for 5 days., Disp: 10 tablet, Rfl: 0   rivaroxaban  (XARELTO ) 2.5 MG TABS tablet, TAKE 1 TABLET BY MOUTH TWICE A DAY, Disp: 180 tablet, Rfl: 1   rosuvastatin  (CRESTOR ) 20 MG tablet, TAKE 1 TABLET BY MOUTH EVERY DAY, Disp: 90 tablet, Rfl: 2   spironolactone  (ALDACTONE ) 25 MG tablet, TAKE 1 TABLET (25 MG TOTAL) BY MOUTH DAILY., Disp: 90 tablet, Rfl: 0   TRELEGY ELLIPTA  100-62.5-25 MCG/ACT AEPB, TAKE 1 PUFF BY MOUTH EVERY DAY, Disp: 120 each, Rfl: 1 [2] No Known Allergies  "

## 2024-08-20 NOTE — Assessment & Plan Note (Signed)
 Education provided on COPD exacerbations. Encouraged to use Albuterol  as needed for wheezing or shortness of breath. Continue cough syrup and Tylenol  as needed.  Orders:   predniSONE  (DELTASONE ) 20 MG tablet; Take 2 tablets (40 mg total) by mouth daily for 5 days.   amoxicillin -clavulanate (AUGMENTIN ) 875-125 MG tablet; Take 1 tablet by mouth 2 (two) times daily for 7 days.

## 2024-10-15 ENCOUNTER — Ambulatory Visit: Admitting: Internal Medicine

## 2025-04-17 ENCOUNTER — Ambulatory Visit
